# Patient Record
Sex: Female | Born: 1970
Health system: Southern US, Community
[De-identification: ages and names within clinical notes are randomized; demographics above are authoritative.]

## PROBLEM LIST (undated history)

## (undated) DIAGNOSIS — M549 Dorsalgia, unspecified: Secondary | ICD-10-CM

## (undated) DIAGNOSIS — L509 Urticaria, unspecified: Secondary | ICD-10-CM

## (undated) DIAGNOSIS — F32A Depression, unspecified: Secondary | ICD-10-CM

## (undated) DIAGNOSIS — G8929 Other chronic pain: Secondary | ICD-10-CM

## (undated) DIAGNOSIS — I10 Essential (primary) hypertension: Secondary | ICD-10-CM

## (undated) DIAGNOSIS — T783XXA Angioneurotic edema, initial encounter: Secondary | ICD-10-CM

## (undated) DIAGNOSIS — F419 Anxiety disorder, unspecified: Secondary | ICD-10-CM

## (undated) DIAGNOSIS — F99 Mental disorder, not otherwise specified: Secondary | ICD-10-CM

## (undated) DIAGNOSIS — F329 Major depressive disorder, single episode, unspecified: Secondary | ICD-10-CM

## (undated) DIAGNOSIS — J449 Chronic obstructive pulmonary disease, unspecified: Secondary | ICD-10-CM

## (undated) DIAGNOSIS — F4310 Post-traumatic stress disorder, unspecified: Secondary | ICD-10-CM

## (undated) HISTORY — DX: Urticaria, unspecified: L50.9

## (undated) HISTORY — DX: Mental disorder, not otherwise specified: F99

## (undated) HISTORY — PX: CYST REMOVAL HAND: SHX6279

## (undated) HISTORY — DX: Depression, unspecified: F32.A

## (undated) HISTORY — DX: Angioneurotic edema, initial encounter: T78.3XXA

## (undated) HISTORY — DX: Major depressive disorder, single episode, unspecified: F32.9

---

## 1998-04-05 ENCOUNTER — Ambulatory Visit (HOSPITAL_COMMUNITY): Admission: RE | Admit: 1998-04-05 | Discharge: 1998-04-05 | Payer: Self-pay | Admitting: Neurosurgery

## 1999-12-12 ENCOUNTER — Emergency Department (HOSPITAL_COMMUNITY): Admission: EM | Admit: 1999-12-12 | Discharge: 1999-12-12 | Payer: Self-pay | Admitting: Emergency Medicine

## 1999-12-12 ENCOUNTER — Encounter: Payer: Self-pay | Admitting: Emergency Medicine

## 2002-01-16 ENCOUNTER — Other Ambulatory Visit: Admission: RE | Admit: 2002-01-16 | Discharge: 2002-01-16 | Payer: Self-pay | Admitting: Family Medicine

## 2002-01-16 ENCOUNTER — Other Ambulatory Visit: Admission: RE | Admit: 2002-01-16 | Discharge: 2002-01-16 | Payer: Self-pay | Admitting: Anesthesiology

## 2003-11-07 ENCOUNTER — Ambulatory Visit (HOSPITAL_BASED_OUTPATIENT_CLINIC_OR_DEPARTMENT_OTHER): Admission: RE | Admit: 2003-11-07 | Discharge: 2003-11-07 | Payer: Self-pay | Admitting: *Deleted

## 2003-11-07 ENCOUNTER — Ambulatory Visit (HOSPITAL_COMMUNITY): Admission: RE | Admit: 2003-11-07 | Discharge: 2003-11-07 | Payer: Self-pay | Admitting: *Deleted

## 2003-11-07 ENCOUNTER — Encounter (INDEPENDENT_AMBULATORY_CARE_PROVIDER_SITE_OTHER): Payer: Self-pay | Admitting: Specialist

## 2005-10-10 ENCOUNTER — Emergency Department (HOSPITAL_COMMUNITY): Admission: EM | Admit: 2005-10-10 | Discharge: 2005-10-10 | Payer: Self-pay | Admitting: Emergency Medicine

## 2007-04-07 ENCOUNTER — Other Ambulatory Visit: Admission: RE | Admit: 2007-04-07 | Discharge: 2007-04-07 | Payer: Self-pay | Admitting: Family Medicine

## 2007-04-15 ENCOUNTER — Emergency Department (HOSPITAL_COMMUNITY): Admission: EM | Admit: 2007-04-15 | Discharge: 2007-04-15 | Payer: Self-pay | Admitting: Emergency Medicine

## 2007-06-02 ENCOUNTER — Emergency Department (HOSPITAL_COMMUNITY): Admission: EM | Admit: 2007-06-02 | Discharge: 2007-06-02 | Payer: Self-pay | Admitting: Emergency Medicine

## 2008-01-10 ENCOUNTER — Emergency Department (HOSPITAL_COMMUNITY): Admission: EM | Admit: 2008-01-10 | Discharge: 2008-01-10 | Payer: Self-pay | Admitting: Emergency Medicine

## 2008-06-22 ENCOUNTER — Encounter: Admission: RE | Admit: 2008-06-22 | Discharge: 2008-06-26 | Payer: Self-pay | Admitting: Anesthesiology

## 2008-06-26 ENCOUNTER — Ambulatory Visit: Payer: Self-pay | Admitting: Anesthesiology

## 2008-08-07 ENCOUNTER — Encounter: Admission: RE | Admit: 2008-08-07 | Discharge: 2008-08-22 | Payer: Self-pay | Admitting: Anesthesiology

## 2009-10-14 ENCOUNTER — Other Ambulatory Visit: Admission: RE | Admit: 2009-10-14 | Discharge: 2009-10-14 | Payer: Self-pay | Admitting: Family Medicine

## 2010-10-02 ENCOUNTER — Emergency Department (HOSPITAL_COMMUNITY)
Admission: EM | Admit: 2010-10-02 | Discharge: 2010-10-02 | Payer: Self-pay | Source: Home / Self Care | Admitting: Emergency Medicine

## 2010-11-03 ENCOUNTER — Ambulatory Visit: Payer: Medicaid Other | Attending: Family Medicine | Admitting: Physical Therapy

## 2010-11-03 DIAGNOSIS — M545 Low back pain, unspecified: Secondary | ICD-10-CM | POA: Insufficient documentation

## 2010-11-03 DIAGNOSIS — M25659 Stiffness of unspecified hip, not elsewhere classified: Secondary | ICD-10-CM | POA: Insufficient documentation

## 2010-11-03 DIAGNOSIS — IMO0001 Reserved for inherently not codable concepts without codable children: Secondary | ICD-10-CM | POA: Insufficient documentation

## 2010-11-10 ENCOUNTER — Encounter: Payer: Medicaid Other | Admitting: Physical Therapy

## 2010-11-17 ENCOUNTER — Ambulatory Visit: Payer: Medicaid Other | Attending: Family Medicine | Admitting: Physical Therapy

## 2010-11-17 DIAGNOSIS — IMO0001 Reserved for inherently not codable concepts without codable children: Secondary | ICD-10-CM | POA: Insufficient documentation

## 2010-11-17 DIAGNOSIS — M545 Low back pain, unspecified: Secondary | ICD-10-CM | POA: Insufficient documentation

## 2010-11-17 DIAGNOSIS — M25659 Stiffness of unspecified hip, not elsewhere classified: Secondary | ICD-10-CM | POA: Insufficient documentation

## 2010-11-26 ENCOUNTER — Ambulatory Visit: Payer: Medicaid Other | Admitting: Physical Therapy

## 2010-12-09 ENCOUNTER — Ambulatory Visit: Payer: Medicaid Other | Attending: Family Medicine | Admitting: Physical Therapy

## 2010-12-09 DIAGNOSIS — M545 Low back pain, unspecified: Secondary | ICD-10-CM | POA: Insufficient documentation

## 2010-12-09 DIAGNOSIS — M25659 Stiffness of unspecified hip, not elsewhere classified: Secondary | ICD-10-CM | POA: Insufficient documentation

## 2010-12-09 DIAGNOSIS — IMO0001 Reserved for inherently not codable concepts without codable children: Secondary | ICD-10-CM | POA: Insufficient documentation

## 2011-01-20 NOTE — Assessment & Plan Note (Signed)
Windi Toro comes to [facility] today as a kind  referral from Dr. Jake Seats Cohen's office complaining of lower back and left  leg pain.  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  She does not think she can return to work with pain like  this, and relates that she has had 3 epidural translaminar interventions  with modest recycling, and we discussed this.  She has had a number of  different approaches assessing her leg pain and back pain, inclusive of  MRI nerve conduction studies essentially all within normal limits 6/10  subjective scale made worse by walking, bending, and sitting sometimes  standing, improved with heat and ice.  She wants to do more activities,  and increase her endurance capacity.  She is stating that she is able to  do most household activities.  A 14-point review of systems.  Health and  History form reviewed.  She is seeing also psychiatrist who is helping  with situational anxiety and depression.  She states no wish to harm  self or others and doing well here.  TMH remarkable for hypertension and  cyst on the wrist, she is single, denies chemical alcohol dependency.   PHYSICAL EXAMINATION:  GENERAL:  Pleasant obese female sitting  comfortably in bed.  Gait, affect (normal oriented x3).  HEENT:  Otherwise unremarkable.  CHEST:  Clear to auscultation and percussion.  She has regular rate and  rhythm without rub, murmur, or gallop.  ABDOMEN:  Obese, benign.  No hepatosplenomegaly.  She has diffuse  paralumbar myofascial discomfort with Fortin test positive, side  bending.  Waddell positive 1/5.  Pain with extension, but I do not seen  anything new neurologically.  A mixed spondylitic modest radicular pain,  particularly 5.   IMPRESSION:  Degenerative spine disease,  lumbar spine with spondylitic  pain.   PLAN:  1. The degenerative components  are going to be probably reflected in      mild fascial overlay, with spondylotic pain, and possible nerve      root irritability without neurocompressive event.  It is reasonable      to consider either facet injection or diagnostic therapeutic      transforaminal intervention and she will consider this.  I am going      to start her on Ultram ER to avoid nonnarcotic medication      alternatives emphasizing return to work and return to active      functional lifestyle.  2. Maintain contact with her psychiatrist.  Psychiatrist has her out      of work.  3. Recommend weight control.  See primary care.  She is trying to      loose weight.  She understands this and I discuss activities at      home to help in this regard.  4. Ultram ER symptom management.  5. I do not think, we will need to go into adjuncts.  She does not      like taking a lot of medicines, and I will review those with her.      Questions were answered, discussed in lay terms, I will order a      TENS unit.  See how she is doing in a couple of weeks.           ______________________________  Celene Kras, MD     HH/MedQ  D:  06/26/2008 13:23:31  T:  06/27/2008 01:43:14  Job #:  [id number]   cc:   Sharolyn Douglas, M.D.  Fax: [phone]

## 2011-01-23 NOTE — Op Note (Signed)
Sabrina Ortiz, Sabrina Ortiz                         ACCOUNT NO.:  [id number]   MEDICAL RECORD NO.:  [id number]                   PATIENT TYPE:  AMB   LOCATION:  [facility]                                  FACILITY:  [facility]   PHYSICIAN:  Lowell Bouton, M.D.      DATE OF BIRTH:  [birth year]   DATE OF PROCEDURE:  11/07/2003  DATE OF DISCHARGE:                                 OPERATIVE REPORT   PREOPERATIVE DIAGNOSIS:  Dorsal ganglion, left wrist.   POSTOPERATIVE DIAGNOSIS:  Dorsal ganglion, left wrist.   OPERATION PERFORMED:  Excision of dorsal ganglion, left wrist.   SURGEON:  Lowell Bouton, M.D.   ANESTHESIA:  General.   OPERATIVE FINDINGS:  The patient had a multilobed ganglion that arose from a  very small stalk at the scapholunate interval.   DESCRIPTION OF PROCEDURE:  Under general anesthesia with a tourniquet on the  left arm, the left hand was prepped and draped in the usual fashion.  After  exsanguinating the limb, the tourniquet was deflated to 250 mmHg.  A  transverse incision was made across the dorsum of the wrist over the mass.  Blunt dissection was carried down through the subcutaneous tissue and  bleeding points were coagulated.  Blunt dissection was carried down to the  cyst and it was dissected out.  It appeared to arise from the scapholunate  interval.  After dissecting the mass out completely, bluntly, the mass was  excised in its entirety.  It was sent to the lab.  It was found to have a  stalk that went down to the scapholunate which was sharply dissected down to  the ligament.  The wound was then irrigated with saline.  The skin was  closed over a vessel loop drain using a 3-0 subcuticular Prolene.  Steri-  Strips were applied followed by sterile dressings.  The patient was placed  in a volar wrist splint.  The tourniquet was released with good circulation  to the hand.  The patient went to the recovery room awake and stable in good  condition.  0.5%  Marcaine was inserted in the skin incision for pain  control.                                               Lowell Bouton, M.D.    EMM/MEDQ  D:  11/07/2003  T:  11/08/2003  Job:  [id number]   cc:   Lorelle Formosa, M.D.  [phone] [address]. [address]  [place]  [place] [address]  Fax: [phone]

## 2011-08-02 ENCOUNTER — Emergency Department (HOSPITAL_COMMUNITY)
Admission: EM | Admit: 2011-08-02 | Discharge: 2011-08-02 | Disposition: A | Discharge status: Home / Self Care | Payer: No Typology Code available for payment source | Attending: Emergency Medicine | Admitting: Emergency Medicine

## 2011-08-02 DIAGNOSIS — S8002XA Contusion of left knee, initial encounter: Secondary | ICD-10-CM

## 2011-08-02 DIAGNOSIS — M545 Low back pain, unspecified: Secondary | ICD-10-CM

## 2011-08-02 DIAGNOSIS — S8000XA Contusion of unspecified knee, initial encounter: Secondary | ICD-10-CM | POA: Insufficient documentation

## 2011-08-02 DIAGNOSIS — Y92410 Unspecified street and highway as the place of occurrence of the external cause: Secondary | ICD-10-CM | POA: Insufficient documentation

## 2011-08-02 DIAGNOSIS — Z79899 Other long term (current) drug therapy: Secondary | ICD-10-CM | POA: Insufficient documentation

## 2011-08-02 MED ORDER — OXYCODONE-ACETAMINOPHEN 5-325 MG PO TABS
1.0000 | ORAL_TABLET | Freq: Once | ORAL | Status: AC
Start: 2011-08-02 — End: 2011-08-02
  Administered 2011-08-02: 1 via ORAL
  Filled 2011-08-02: qty 1

## 2011-08-02 MED ORDER — DIAZEPAM 5 MG PO TABS: ORAL_TABLET | ORAL | Status: AC

## 2011-08-02 MED ORDER — IBUPROFEN 800 MG PO TABS
800.0000 mg | ORAL_TABLET | Freq: Once | ORAL | Status: AC
  Administered 2011-08-02: 800 mg via ORAL
  Filled 2011-08-02: qty 1

## 2011-08-02 MED ORDER — IBUPROFEN 800 MG PO TABS: 800.0000 mg | ORAL_TABLET | Freq: Three times a day (TID) | ORAL | Status: AC

## 2011-08-02 MED ORDER — HYDROCODONE-ACETAMINOPHEN 5-500 MG PO TABS: 1.0000 | ORAL_TABLET | Freq: Four times a day (QID) | ORAL | Status: AC | PRN
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
## 2011-08-03 ENCOUNTER — Encounter: Payer: Self-pay | Admitting: *Deleted

## 2011-08-03 NOTE — ED Provider Notes (Signed)
Medical screening examination/treatment/procedure(s) were performed by non-physician practitioner and as supervising physician I was immediately available for consultation/collaboration.   Lyanne Co, MD 08/03/11 [phone]

## 2012-03-07 ENCOUNTER — Encounter (HOSPITAL_COMMUNITY): Payer: Self-pay | Admitting: *Deleted

## 2012-03-07 ENCOUNTER — Emergency Department (HOSPITAL_COMMUNITY)
Admission: EM | Admit: 2012-03-07 | Discharge: 2012-03-08 | Disposition: A | Discharge status: Other Acute Inpatient Hospital | Payer: Self-pay | Attending: Emergency Medicine | Admitting: Emergency Medicine

## 2012-03-07 DIAGNOSIS — R5381 Other malaise: Secondary | ICD-10-CM | POA: Insufficient documentation

## 2012-03-07 DIAGNOSIS — F329 Major depressive disorder, single episode, unspecified: Secondary | ICD-10-CM

## 2012-03-07 DIAGNOSIS — R079 Chest pain, unspecified: Secondary | ICD-10-CM | POA: Insufficient documentation

## 2012-03-07 DIAGNOSIS — R45851 Suicidal ideations: Secondary | ICD-10-CM | POA: Insufficient documentation

## 2012-03-07 DIAGNOSIS — K219 Gastro-esophageal reflux disease without esophagitis: Secondary | ICD-10-CM | POA: Insufficient documentation

## 2012-03-07 DIAGNOSIS — Z79899 Other long term (current) drug therapy: Secondary | ICD-10-CM | POA: Insufficient documentation

## 2012-03-07 DIAGNOSIS — F3289 Other specified depressive episodes: Secondary | ICD-10-CM | POA: Insufficient documentation

## 2012-03-07 LAB — COMPREHENSIVE METABOLIC PANEL
ALT: 24 U/L (ref 0–35)
AST: 26 U/L (ref 0–37)
Alkaline Phosphatase: 55 U/L (ref 39–117)
CO2: 25 mEq/L (ref 19–32)
Chloride: 100 mEq/L (ref 96–112)
Creatinine, Ser: 1.01 mg/dL (ref 0.50–1.10)
GFR calc non Af Amer: 68 mL/min — ABNORMAL LOW (ref >90)
Potassium: 3.6 mEq/L (ref 3.5–5.1)
Total Bilirubin: 0.2 mg/dL — ABNORMAL LOW (ref 0.3–1.2)

## 2012-03-07 LAB — POCT I-STAT TROPONIN I: Troponin i, poc: 0 ng/mL (ref 0.00–0.08)

## 2012-03-07 LAB — RAPID URINE DRUG SCREEN, HOSP PERFORMED
Amphetamines: NOT DETECTED
Barbiturates: NOT DETECTED
Opiates: NOT DETECTED
Tetrahydrocannabinol: NOT DETECTED

## 2012-03-07 LAB — CBC
MCV: 75.6 fL — ABNORMAL LOW (ref 78.0–100.0)
Platelets: 198 10*3/uL (ref 150–400)
RBC: 5.04 MIL/uL (ref 3.87–5.11)
WBC: 8.8 10*3/uL (ref 4.0–10.5)

## 2012-03-07 LAB — PREGNANCY, URINE: Preg Test, Ur: NEGATIVE

## 2012-03-07 MED ORDER — HYDROCHLOROTHIAZIDE 25 MG PO TABS
25.0000 mg | ORAL_TABLET | Freq: Every day | ORAL | Status: DC
  Administered 2012-03-08: 25 mg via ORAL
  Filled 2012-03-07: qty 1

## 2012-03-07 MED ORDER — GABAPENTIN 300 MG PO CAPS
300.0000 mg | ORAL_CAPSULE | Freq: Every day | ORAL | Status: DC
  Administered 2012-03-07: 300 mg via ORAL
  Filled 2012-03-07 (×2): qty 1

## 2012-03-07 MED ORDER — LISINOPRIL 20 MG PO TABS
20.0000 mg | ORAL_TABLET | Freq: Every day | ORAL | Status: DC
  Administered 2012-03-08: 20 mg via ORAL
  Filled 2012-03-07: qty 1

## 2012-03-07 MED ORDER — SERTRALINE HCL 50 MG PO TABS
100.0000 mg | ORAL_TABLET | Freq: Every day | ORAL | Status: DC
  Administered 2012-03-07: 100 mg via ORAL
  Administered 2012-03-08: 50 mg via ORAL
  Filled 2012-03-07 (×2): qty 2

## 2012-03-07 MED ORDER — ARIPIPRAZOLE 10 MG PO TABS
10.0000 mg | ORAL_TABLET | Freq: Every day | ORAL | Status: DC
  Administered 2012-03-07 – 2012-03-08 (×2): 10 mg via ORAL
  Filled 2012-03-07 (×2): qty 1

## 2012-03-07 MED ORDER — LORAZEPAM 1 MG PO TABS: 1.0000 mg | ORAL_TABLET | Freq: Three times a day (TID) | ORAL | Status: DC | PRN

## 2012-03-07 MED ORDER — PANTOPRAZOLE SODIUM 40 MG PO TBEC
40.0000 mg | DELAYED_RELEASE_TABLET | Freq: Every day | ORAL | Status: DC
  Administered 2012-03-07 – 2012-03-08 (×2): 40 mg via ORAL
  Filled 2012-03-07 (×2): qty 1

## 2012-03-07 MED ORDER — LISINOPRIL-HYDROCHLOROTHIAZIDE 20-25 MG PO TABS: 1.0000 | ORAL_TABLET | Freq: Every day | ORAL | Status: DC

## 2012-03-07 MED ORDER — ALUM & MAG HYDROXIDE-SIMETH 200-200-20 MG/5ML PO SUSP: 30.0000 mL | ORAL | Status: DC | PRN

## 2012-03-07 MED ORDER — SIMVASTATIN 20 MG PO TABS
20.0000 mg | ORAL_TABLET | Freq: Every day | ORAL | Status: DC
  Administered 2012-03-08: 20 mg via ORAL
  Filled 2012-03-07: qty 1

## 2012-03-07 NOTE — ED Notes (Signed)
PT SCRUBBED AND WANDED

## 2012-03-07 NOTE — ED Notes (Signed)
Pt ivc for c/o suicidal; pt states has been suicidal x 2 wks; states feels mad and angry; states has a plan to overdose on meds; calm and cooperative

## 2012-03-07 NOTE — ED Provider Notes (Signed)
History     CSN: [id number]  Arrival date & time 03/07/12  1919   First MD Initiated Contact with Patient 03/07/12 2035      Chief Complaint  Patient presents with  . Medical Clearance  . Suicidal    (Consider location/radiation/quality/duration/timing/severity/associated sxs/prior treatment) HPI Comments: Patient here under IVC for suicidal thoughts - she states that she stopped her medication for depression about 1 month ago when she could no longer afford it - states that starting about 2 weeks ago she started thinking about commiting suicide, reports previous attempt  In the past with taking pills, which would be her method of choice today.  She states she has been mad and angry at the world, only physical complaints are substernal chest pain - states that has been going on since she stopped taking her nexium and she believes this to be her GERD.  Reports that the pain is non-radiation, not associated with shortness of breath, cough, diaphoresis, nausea or vomiting.  Patient is a 41 y.o. female presenting with mental health disorder. The history is provided by the patient. No language interpreter was used.  Mental Health Problem The primary symptoms include dysphoric mood. The primary symptoms do not include delusions, hallucinations, bizarre behavior, disorganized speech, negative symptoms or somatic symptoms. The current episode started 1 to 2 weeks ago. This is a recurrent problem.  The onset of the illness is precipitated by emotional stress. The degree of incapacity that she is experiencing as a consequence of her illness is severe. Sequelae of the illness include harmed interpersonal relations, an inability to care for self and an inability to work. Additional symptoms of the illness include anhedonia, fatigue and feelings of worthlessness. Additional symptoms of the illness do not include no insomnia, no hypersomnia, no appetite change, no unexpected weight change, no agitation, no  psychomotor retardation, no attention impairment, no euphoric mood, no increased goal-directed activity, no flight of ideas, no inflated self-esteem, no decreased need for sleep, patient does not exhibit inattention/distractibility, no poor judgment, no visual change, no headaches, no abdominal pain or no seizures. She admits to suicidal ideas. She does have a plan to commit suicide. She contemplates harming herself. She has not already injured self. She does not contemplate injuring another person. She has not already  injured another person. Risk factors that are present for mental illness include a history of mental illness and a history of suicide attempts.    History reviewed. No pertinent past medical history.  History reviewed. No pertinent past surgical history.  No family history on file.  History  Substance Use Topics  . Smoking status: Not on file  . Smokeless tobacco: Not on file  . Alcohol Use: No    OB History    Grav Para Term Preterm Abortions TAB SAB Ect Mult Living                  Review of Systems  Constitutional: Positive for fatigue. Negative for appetite change and unexpected weight change.  HENT: Negative for neck pain.   Eyes: Negative for pain.  Cardiovascular: Positive for chest pain.  Gastrointestinal: Negative for abdominal pain.  Genitourinary: Negative for dysuria.  Musculoskeletal: Negative for back pain.  Neurological: Negative for seizures and headaches.  Psychiatric/Behavioral: Positive for suicidal ideas and dysphoric mood. Negative for hallucinations and agitation. The patient does not have insomnia.   All other systems reviewed and are negative.    Allergies  Review of patient's allergies indicates no known  allergies.  Home Medications   Current Outpatient Rx  Name Route Sig Dispense Refill  . ARIPIPRAZOLE 10 MG PO TABS Oral Take 10 mg by mouth daily.      Marland Kitchen GABAPENTIN 100 MG PO CAPS Oral Take 300 mg by mouth at bedtime.    Marland Kitchen  LISINOPRIL-HYDROCHLOROTHIAZIDE 20-25 MG PO TABS Oral Take 1 tablet by mouth daily.    Marland Kitchen PANTOPRAZOLE SODIUM 40 MG PO TBEC Oral Take 40 mg by mouth daily.      Marland Kitchen PRAVASTATIN SODIUM 40 MG PO TABS Oral Take 40 mg by mouth daily.    . SERTRALINE HCL 100 MG PO TABS Oral Take 100 mg by mouth daily.        BP 121/77  Pulse 84  Temp 97.6 F (36.4 C) (Oral)  Resp 20  SpO2 100%  LMP 03/01/2012  Physical Exam  Nursing note and vitals reviewed. Constitutional: She is oriented to person, place, and time. She appears well-developed and well-nourished. No distress.  HENT:  Head: Normocephalic and atraumatic.  Right Ear: External ear normal.  Left Ear: External ear normal.  Nose: Nose normal.  Mouth/Throat: Oropharynx is clear and moist. No oropharyngeal exudate.  Eyes: Conjunctivae are normal. Pupils are equal, round, and reactive to light. No scleral icterus.  Neck: Normal range of motion. Neck supple.  Cardiovascular: Normal rate, regular rhythm and normal heart sounds.  Exam reveals no gallop and no friction rub.   No murmur heard. Pulmonary/Chest: Effort normal and breath sounds normal. No respiratory distress. She has no wheezes. She has no rales. She exhibits no tenderness.  Abdominal: Soft. Bowel sounds are normal. She exhibits no distension. There is no tenderness.  Musculoskeletal: Normal range of motion. She exhibits no edema and no tenderness.  Lymphadenopathy:    She has no cervical adenopathy.  Neurological: She is alert and oriented to person, place, and time. No cranial nerve deficit. She exhibits normal muscle tone. Coordination normal.  Skin: Skin is warm and dry. No rash noted. No erythema. No pallor.  Psychiatric: She has a normal mood and affect. Her behavior is normal. Judgment and thought content normal.    ED Course  Procedures (including critical care time)  Labs Reviewed  CBC - Abnormal; Notable for the following:    MCV 75.6 (*)     MCH 25.2 (*)     RDW 15.9 (*)      All other components within normal limits  COMPREHENSIVE METABOLIC PANEL - Abnormal; Notable for the following:    Glucose, Bld 107 (*)     Total Bilirubin 0.2 (*)     GFR calc non Af Amer 68 (*)     GFR calc Af Amer 79 (*)     All other components within normal limits  ETHANOL  URINE RAPID DRUG SCREEN (HOSP PERFORMED)  PREGNANCY, URINE   No results found.  Results for orders placed during the hospital encounter of 03/07/12  CBC      Component Value Range   WBC 8.8  4.0 - 10.5 K/uL   RBC 5.04  3.87 - 5.11 MIL/uL   Hemoglobin 12.7  12.0 - 15.0 g/dL   HCT 16.1  09.6 - 04.5 %   MCV 75.6 (*) 78.0 - 100.0 fL   MCH 25.2 (*) 26.0 - 34.0 pg   MCHC 33.3  30.0 - 36.0 g/dL   RDW 40.9 (*) 81.1 - 91.4 %   Platelets 198  150 - 400 K/uL  COMPREHENSIVE METABOLIC PANEL  Component Value Range   Sodium 137  135 - 145 mEq/L   Potassium 3.6  3.5 - 5.1 mEq/L   Chloride 100  96 - 112 mEq/L   CO2 25  19 - 32 mEq/L   Glucose, Bld 107 (*) 70 - 99 mg/dL   BUN 9  6 - 23 mg/dL   Creatinine, Ser 1.61  0.50 - 1.10 mg/dL   Calcium 9.5  8.4 - 09.6 mg/dL   Total Protein 7.4  6.0 - 8.3 g/dL   Albumin 4.0  3.5 - 5.2 g/dL   AST 26  0 - 37 U/L   ALT 24  0 - 35 U/L   Alkaline Phosphatase 55  39 - 117 U/L   Total Bilirubin 0.2 (*) 0.3 - 1.2 mg/dL   GFR calc non Af Amer 68 (*) >90 mL/min   GFR calc Af Amer 79 (*) >90 mL/min  ETHANOL      Component Value Range   Alcohol, Ethyl (B) <11  0 - 11 mg/dL  URINE RAPID DRUG SCREEN (HOSP PERFORMED)      Component Value Range   Opiates NONE DETECTED  NONE DETECTED   Cocaine NONE DETECTED  NONE DETECTED   Benzodiazepines NONE DETECTED  NONE DETECTED   Amphetamines NONE DETECTED  NONE DETECTED   Tetrahydrocannabinol NONE DETECTED  NONE DETECTED   Barbiturates NONE DETECTED  NONE DETECTED  PREGNANCY, URINE      Component Value Range   Preg Test, Ur NEGATIVE  NEGATIVE  POCT I-STAT TROPONIN I      Component Value Range   Troponin i, poc 0.00  0.00 - 0.08  ng/mL   Comment 3           CARDIAC PANEL(CRET KIN+CKTOT+MB+TROPI)      Component Value Range   Total CK 178 (*) 7 - 177 U/L   CK, MB 1.3  0.3 - 4.0 ng/mL   Troponin I <0.30  <0.30 ng/mL   Relative Index 0.7  0.0 - 2.5   No results found.  Date: 03/08/2012  Rate: 69  Rhythm: normal sinus rhythm  QRS Axis: normal  Intervals: normal  ST/T Wave abnormalities: nonspecific T wave changes  Conduction Disutrbances:none  Narrative Interpretation: Reviewed by Dr. Lynelle Doctor  Old EKG Reviewed: none available    Suicidal Ideation Depression GERD   MDM  4:07 AM Second troponin is negative so I believe the patient's chest pain to be GERD related.  She is therefore medically cleared and I have spoken with Barth Kirks with ACT team who will see the patient.  I have started her back on her psychiatric and GERD medications.        Izola Price [place], [place] 03/08/12 0410

## 2012-03-08 ENCOUNTER — Telehealth (HOSPITAL_COMMUNITY): Payer: Self-pay | Admitting: Licensed Clinical Social Worker

## 2012-03-08 ENCOUNTER — Inpatient Hospital Stay (HOSPITAL_COMMUNITY)
Admission: AD | Admit: 2012-03-08 | Discharge: 2012-03-11 | DRG: 885 | Disposition: A | Discharge status: Home / Self Care | Payer: Federal, State, Local not specified - Other | Source: Ambulatory Visit | Attending: Psychiatry | Admitting: Psychiatry

## 2012-03-08 ENCOUNTER — Encounter (HOSPITAL_COMMUNITY): Payer: Self-pay

## 2012-03-08 ENCOUNTER — Encounter (HOSPITAL_COMMUNITY): Payer: Self-pay | Admitting: Licensed Clinical Social Worker

## 2012-03-08 DIAGNOSIS — F411 Generalized anxiety disorder: Secondary | ICD-10-CM | POA: Diagnosis present

## 2012-03-08 DIAGNOSIS — I10 Essential (primary) hypertension: Secondary | ICD-10-CM | POA: Diagnosis present

## 2012-03-08 DIAGNOSIS — F489 Nonpsychotic mental disorder, unspecified: Secondary | ICD-10-CM | POA: Diagnosis present

## 2012-03-08 DIAGNOSIS — G4701 Insomnia due to medical condition: Secondary | ICD-10-CM

## 2012-03-08 DIAGNOSIS — F333 Major depressive disorder, recurrent, severe with psychotic symptoms: Principal | ICD-10-CM | POA: Diagnosis present

## 2012-03-08 DIAGNOSIS — F4310 Post-traumatic stress disorder, unspecified: Secondary | ICD-10-CM | POA: Diagnosis present

## 2012-03-08 DIAGNOSIS — Z79899 Other long term (current) drug therapy: Secondary | ICD-10-CM

## 2012-03-08 DIAGNOSIS — F5105 Insomnia due to other mental disorder: Secondary | ICD-10-CM | POA: Diagnosis present

## 2012-03-08 HISTORY — DX: Post-traumatic stress disorder, unspecified: F43.10

## 2012-03-08 HISTORY — DX: Other chronic pain: G89.29

## 2012-03-08 HISTORY — DX: Dorsalgia, unspecified: M54.9

## 2012-03-08 HISTORY — DX: Anxiety disorder, unspecified: F41.9

## 2012-03-08 HISTORY — DX: Essential (primary) hypertension: I10

## 2012-03-08 LAB — CARDIAC PANEL(CRET KIN+CKTOT+MB+TROPI)
Total CK: 178 U/L — ABNORMAL HIGH (ref 7–177)
Troponin I: <0.3 ng/mL (ref <0.30)

## 2012-03-08 MED ORDER — ACETAMINOPHEN 325 MG PO TABS: 650.0000 mg | ORAL_TABLET | Freq: Four times a day (QID) | ORAL | Status: DC | PRN

## 2012-03-08 MED ORDER — MAGNESIUM HYDROXIDE 400 MG/5ML PO SUSP: 30.0000 mL | Freq: Every day | ORAL | Status: DC | PRN

## 2012-03-08 MED ORDER — ARIPIPRAZOLE 10 MG PO TABS
10.0000 mg | ORAL_TABLET | Freq: Every day | ORAL | Status: DC
  Administered 2012-03-09 – 2012-03-10 (×2): 10 mg via ORAL
  Filled 2012-03-08 (×5): qty 1

## 2012-03-08 MED ORDER — HYDROCHLOROTHIAZIDE 25 MG PO TABS
25.0000 mg | ORAL_TABLET | Freq: Every day | ORAL | Status: DC
  Administered 2012-03-09 – 2012-03-11 (×3): 25 mg via ORAL
  Filled 2012-03-08 (×6): qty 1

## 2012-03-08 MED ORDER — SIMVASTATIN 20 MG PO TABS: 20.0000 mg | ORAL_TABLET | Freq: Every day | ORAL | Status: DC

## 2012-03-08 MED ORDER — HYDROXYZINE HCL 25 MG PO TABS
25.0000 mg | ORAL_TABLET | Freq: Four times a day (QID) | ORAL | Status: DC | PRN
  Administered 2012-03-09: 25 mg via ORAL

## 2012-03-08 MED ORDER — SIMVASTATIN 20 MG PO TABS
20.0000 mg | ORAL_TABLET | Freq: Every day | ORAL | Status: DC
  Administered 2012-03-08 – 2012-03-10 (×3): 20 mg via ORAL
  Filled 2012-03-08 (×6): qty 1

## 2012-03-08 MED ORDER — SERTRALINE HCL 100 MG PO TABS: 100.0000 mg | ORAL_TABLET | Freq: Every day | ORAL | Status: DC

## 2012-03-08 MED ORDER — LISINOPRIL-HYDROCHLOROTHIAZIDE 20-25 MG PO TABS: 1.0000 | ORAL_TABLET | Freq: Every day | ORAL | Status: DC

## 2012-03-08 MED ORDER — SERTRALINE HCL 50 MG PO TABS
150.0000 mg | ORAL_TABLET | Freq: Every day | ORAL | Status: DC
  Filled 2012-03-08 (×4): qty 1

## 2012-03-08 MED ORDER — LISINOPRIL 20 MG PO TABS
20.0000 mg | ORAL_TABLET | Freq: Every day | ORAL | Status: DC
  Administered 2012-03-09 – 2012-03-11 (×3): 20 mg via ORAL
  Filled 2012-03-08 (×6): qty 1

## 2012-03-08 MED ORDER — GABAPENTIN 300 MG PO CAPS
300.0000 mg | ORAL_CAPSULE | Freq: Every day | ORAL | Status: DC
  Administered 2012-03-08 – 2012-03-10 (×3): 300 mg via ORAL
  Filled 2012-03-08 (×2): qty 1
  Filled 2012-03-08: qty 14
  Filled 2012-03-08 (×3): qty 1

## 2012-03-08 MED ORDER — PANTOPRAZOLE SODIUM 40 MG PO TBEC
40.0000 mg | DELAYED_RELEASE_TABLET | Freq: Every day | ORAL | Status: DC
  Administered 2012-03-09 – 2012-03-11 (×3): 40 mg via ORAL
  Filled 2012-03-08 (×6): qty 1

## 2012-03-08 MED ORDER — ALUM & MAG HYDROXIDE-SIMETH 200-200-20 MG/5ML PO SUSP: 30.0000 mL | ORAL | Status: DC | PRN
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
## 2012-03-08 NOTE — ED Provider Notes (Signed)
Medical screening examination/treatment/procedure(s) were performed by non-physician practitioner and as supervising physician I was immediately available for consultation/collaboration.   Celene Kras, MD 03/08/12 [phone]

## 2012-03-08 NOTE — ED Provider Notes (Addendum)
BP 115/65  Pulse 69  Temp 98.1 F (36.7 C) (Oral)  Resp 18  SpO2 98%  LMP 03/01/2012  Patient seen and evaluated by me. No complaints at this time. Awaiting ACT evaluation.   Forbes Cellar, MD 03/08/12 0800   BP 111/75  Pulse 66  Temp 97.8 F (36.6 C) (Oral)  Resp 14  SpO2 100%  LMP 03/01/2012  Accepted to [facility]- Dr. Cynda Acres, MD 03/08/12 [phone]

## 2012-03-08 NOTE — Tx Team (Signed)
Initial Interdisciplinary Treatment Plan  PATIENT STRENGTHS: (choose at least two) Ability for insight Average or above average intelligence General fund of knowledge  PATIENT STRESSORS: Loss of Relationship*   PROBLEM LIST: Problem List/Patient Goals Date to be addressed Date deferred Reason deferred Estimated date of resolution  Depression      Suicidal Ideation                                                 DISCHARGE CRITERIA:  Improved stabilization in mood, thinking, and/or behavior  PRELIMINARY DISCHARGE PLAN: Outpatient therapy  PATIENT/FAMIILY INVOLVEMENT: This treatment plan has been presented to and reviewed with the patient, Sabrina Ortiz, and/or family member.  The patient and family have been given the opportunity to ask questions and make suggestions.  Gretta Arab [facility] 03/08/2012, 8:40 PM

## 2012-03-08 NOTE — ED Notes (Signed)
Discharge instructions reviewed and pt verbalizes understanding about transport to [facility]. [employer] departs with pt in safe and stable condition.

## 2012-03-09 DIAGNOSIS — F333 Major depressive disorder, recurrent, severe with psychotic symptoms: Principal | ICD-10-CM

## 2012-03-09 MED ORDER — SERTRALINE HCL 50 MG PO TABS
50.0000 mg | ORAL_TABLET | Freq: Every day | ORAL | Status: DC
  Administered 2012-03-10 – 2012-03-11 (×2): 50 mg via ORAL
  Filled 2012-03-09: qty 14
  Filled 2012-03-09 (×4): qty 1
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
## 2012-03-09 NOTE — Progress Notes (Signed)
Psychoeducational Group Note  Date:  03/09/2012 Time:  1100  Group Topic/Focus:  Coping With Mental Health Crisis:   The purpose of this group is to help patients identify strategies for coping with mental health crisis.  Group discusses possible causes of crisis and ways to manage them effectively.  Participation Level:  None  Participation Quality:  Appropriate  Affect:  Flat  Cognitive:  Alert  Insight:  Good  Engagement in Group:  Limited  Additional Comments:  Pt was very quiet and did not share any information while attending group.  Sharyn Lull 03/09/2012, 12:28 PM

## 2012-03-09 NOTE — Progress Notes (Addendum)
D: Patient depressed and blunted.  Patient very short in terms of conversation with Clinical research associate.  Patient denies SI/HI and denies AVH.  Patient states that "Everything is going on." Patient would not elaborate on previous statement.   A:  Patient offered support and encouragement.  Patient encouraged to speak with staff if SI occur  Staff to monitor Q 15 mins for safety.  Scheduled medication given per MD orders. R:  Patient took scheduled medication with no problem.  Patient pleasant and cooperative.  Patient remains safe on the unit. Patient in bed resting with eyes closed.

## 2012-03-09 NOTE — Progress Notes (Signed)
Recreation Therapy Group Note  Date: 03/09/2012          Time: 1500      Group Topic/Focus: The focus of this group is on enhancing the patient's understanding of leisure, barriers to leisure, and the importance of engaging in positive leisure activities upon discharge for improved total health.   Participation Level: Active  Participation Quality: Appropriate  Affect: Blunted  Cognitive: Oriented  Additional Comments: None.    Sabrina Ortiz 03/09/2012 3:45 PM
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
## 2012-03-09 NOTE — Progress Notes (Signed)
Patient seen during during d/c planning group and or treatment team.  She reports attempting suicide after learning that she has been denied disability.  She reports she has been working on the disability claim since 2010.  Patient rates depression, anxiety, and hopelessness at ten.  She is followed outpatient by [facility].  She reports she lives with her 41 year old son and has a best friend who is very supportive.  Patient is seen followed outpatient by [name].  She has home and transportation at discharge but will need assistance with transportation.

## 2012-03-09 NOTE — Progress Notes (Signed)
[facility] Group Notes: (Counselor/Nursing/MHT/Case Management/Adjunct) 03/09/2012   1:15-2:30pm Emotion Regulation  Type of Therapy:  Group Therapy  Participation Level:  Minimal  Participation Quality:  Attentive  Affect:  Blunted  Cognitive:  Appropriate  Insight:  None  Engagement in Group: Limited  Engagement in Therapy:  Limited  Modes of Intervention:  Support and Exploration  Summary of Progress/Problems: Sabrina Ortiz was attentive and engaged. She seemed to relate well to the statements of others, nodding her head along with their statements. Seemed quite interested in distress tolerance techniques and the theory of urge surfing, though she did not share any personal experiences.   Angus Palms, LCSW 03/09/2012  4:01 PM

## 2012-03-09 NOTE — Progress Notes (Signed)
Psychoeducational Group Note  Date:  03/09/2012 Time:  2000 Group Topic/Focus:  Wrap-Up Group:   The focus of this group is to help patients review their daily goal of treatment and discuss progress on daily workbooks.  Participation Level:  Minimal  Participation Quality:  Appropriate and Attentive  Affect:  Appropriate  Cognitive:  Appropriate  Insight:  Limited  Engagement in Group:  Limited  Additional Comments:  Pt was appropriate during group. Pt would nod head up and down when she was agreeable with information. Pt would only speak when questions were asked to her.  Karleen Hampshire Brittini 03/09/2012, 11:22 PM
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
## 2012-03-09 NOTE — Progress Notes (Signed)
[facility] Group Notes:  (Counselor/Nursing/MHT/Case Management/Adjunct)  03/09/2012 12:57 PM  Type of Therapy:  Psychoeducational Skills  Participation Level:  Minimal  Participation Quality:  Appropriate and Attentive  Affect:  Blunted  Cognitive:  Appropriate and Oriented  Insight:  Good  Engagement in Group:  Good  Engagement in Therapy:  n/a  Modes of Intervention:  Activity, Education, Problem-solving, Socialization and Support  Summary of Progress/Problems:Sabrina Ortiz was late due to a consult to the Psychoeducational group on labels. 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 Sabrina Ortiz was quiet but attentive while group discussed what labels are, how we use them, and listed positive and negative labels they have used or been called. Sabrina Ortiz was given a homework assignment to list 10 words she has been labeled to find the reality of the situation/label.     Wandra Scot 03/09/2012, 12:57 PM

## 2012-03-10 DIAGNOSIS — G4701 Insomnia due to medical condition: Secondary | ICD-10-CM

## 2012-03-10 DIAGNOSIS — F411 Generalized anxiety disorder: Secondary | ICD-10-CM | POA: Diagnosis present

## 2012-03-10 LAB — TSH: TSH: 0.891 u[IU]/mL (ref 0.350–4.500)

## 2012-03-10 MED ORDER — BUPROPION HCL ER (SR) 100 MG PO TB12
100.0000 mg | ORAL_TABLET | Freq: Two times a day (BID) | ORAL | Status: DC
  Administered 2012-03-10 – 2012-03-11 (×2): 100 mg via ORAL
  Filled 2012-03-10: qty 28
  Filled 2012-03-10 (×4): qty 1
  Filled 2012-03-10: qty 28
  Filled 2012-03-10: qty 1

## 2012-03-10 MED ORDER — TRAZODONE HCL 50 MG PO TABS
50.0000 mg | ORAL_TABLET | Freq: Every day | ORAL | Status: DC
  Administered 2012-03-10: 50 mg via ORAL
  Filled 2012-03-10: qty 1
  Filled 2012-03-10: qty 14
  Filled 2012-03-10 (×2): qty 1
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
## 2012-03-10 NOTE — Progress Notes (Signed)
Psychoeducational Group Note  Date:  03/10/2012 Time:  1100  Group Topic/Focus:  Building Self Esteem:   The Focus of this group is helping patients become aware of the effects of self-esteem on their lives, the things they and others do that enhance or undermine their self-esteem, seeing the relationship between their level of self-esteem and the choices they make and learning ways to enhance self-esteem.  Participation Level:  Minimal  Participation Quality:  Appropriate  Affect:  Flat  Cognitive:  Appropriate  Insight:  Good  Engagement in Group:  Limited  Additional Comments:  Pt was very limited in her interactions while in group.   Sharyn Lull 03/10/2012, 12:12 PM

## 2012-03-10 NOTE — Progress Notes (Signed)
D:Patient rates depression 7/10 tonight and hopelessness 6/10 tonight.  Patient states that she is enjoying the groups.  Patient stated, "I am realizing that I am not the only one who goes through things."  Patient still remains flat,blunted, and depressed.  Patient unsure of medication schedule.  Patient denies SI/HI and denies AVH. A: Patient medication schedule reviewed with patient.  Staff to monitor Q 15 mins for safety. Patient offered encouragement and support. R: Patient remains safe on the unit. Patient compliant with medications. Patient attended wrap up group.

## 2012-03-10 NOTE — Progress Notes (Signed)
Pt attended discharge planning group and actively participated.  Pt presents with flat affect and depressed mood.  Pt rates depression at a 5 and anxiety at a 4 today.  Pt denies SI.  Pt appears hopeless.  Pt will follow up at [facility] for medication management and therapy.  No further needs voiced by pt at this time.    Reyes Ivan, LCSWA 03/10/2012  9:27 AM

## 2012-03-10 NOTE — Progress Notes (Signed)
D: Pt denies SI/HI/AV. Pt is pleasant and cooperative. Pt rates depression at a 5 and Helplessness/hopelessness at a 4. Pt is concerned about medications and at what times she takes her medicine.  A: Pt was offered support and encouragement. Pt was given scheduled medications. Pt was encourage to attend groups. RN in spoke with pt about her medications and discussed with her the different scheduled medication times she has. Q 15 minute checks were done for safety.  R:Pt attends groups and interacts well with peers and staff. Pt taking medication. Pt has no complaints.Pt receptive to treatment and safety maintained on unit.

## 2012-03-10 NOTE — Progress Notes (Addendum)
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  Sleep: Trazodone helps will restart Appetite: The patient reports that her appetite is "up and down."  Mild>(1-10) >Severe  Hopelessness (1-10): 10  Depression (1-10): 10  Anxiety (1-10): 10  Suicidal Ideation: The patient reports suicidal ideations today but with no plan or intent.  Plan: No  Intent: No.  Means: No  Homicidal Ideation: The patient denies any homicidal ideations today.  Plan: No  Intent: No.  Means: No  General Appearance/Behavior: The patient was cooperative today with this provider but appeared still somewhat depressed.  She feels better now that she is restarted on her medications. Eye Contact: Fair.  Speech: Appropriate in rate and volume with no pressuring noted today.  Motor Behavior: wnl  Level of Consciousness: Alert and Oriented x 3.  Mental Status: Alert and Oriented x 3.  Mood: Appears slightly depressed today.  Affect: Flat.  Anxiety Level: Some anxiety reported today.  Thought Process: wnl  Thought Content: The patient denies any current auditory or visual hallucinations today as well as any delusional thinking. She does state at times her deceased Mother visits and speaks with her.  Perception: wnl  Judgment: Good.  Insight: Good.  Cognition: Oriented to person, place and time.  Review of Systems:  Neurological: The patient denies any headaches today. She denies any seizures or dizziness.  G.I.: The patient denies any constipation or any G.I. Upset today.  Musculoskeletal: The patient denies any musculoskeletal issues today.  Discussed restarting the effective Wellbutrin and Trazodone for her.  Current Medications:  Current facility-administered medications:acetaminophen (TYLENOL) tablet 650 mg, 650 mg, Oral, Q6H PRN, Curlene Labrum  Readling, MD;  alum & mag hydroxide-simeth (MAALOX/MYLANTA) 200-200-20 MG/5ML suspension 30 mL, 30 mL, Oral, Q4H PRN, Curlene Labrum Readling, MD;  buPROPion (WELLBUTRIN SR) 12 hr tablet 100 mg, 100 mg, Oral, BID, Mike Craze, MD;  gabapentin (NEURONTIN) capsule 300 mg, 300 mg, Oral, QHS, Randy D Readling, MD, 300 mg at 03/09/12 2108 hydrochlorothiazide (HYDRODIURIL) tablet 25 mg, 25 mg, Oral, Daily, Curlene Labrum Readling, MD, 25 mg at 03/10/12 0816;  hydrOXYzine (ATARAX/VISTARIL) tablet 25 mg, 25 mg, Oral, Q6H PRN, Curlene Labrum Readling, MD, 25 mg at 03/09/12 2108;  lisinopril (PRINIVIL,ZESTRIL) tablet 20 mg, 20 mg, Oral, Daily, Curlene Labrum Readling, MD, 20 mg at 03/10/12 0816;  magnesium hydroxide (MILK OF MAGNESIA) suspension 30 mL, 30 mL, Oral, Daily PRN, Curlene Labrum Readling, MD pantoprazole (PROTONIX) EC tablet 40 mg, 40 mg, Oral, Daily, Curlene Labrum Readling, MD, 40 mg at 03/10/12 0816;  sertraline (ZOLOFT) tablet 50 mg, 50 mg, Oral, Daily, Curlene Labrum Readling, MD, 50 mg at 03/10/12 0816;  simvastatin (ZOCOR) tablet 20 mg, 20 mg, Oral, QHS, Randy D Readling, MD, 20 mg at 03/09/12 2108;  traZODone (DESYREL) tablet 50 mg, 50 mg, Oral, QHS, Mike Craze, MD DISCONTD: ARIPiprazole (ABILIFY) tablet 10 mg, 10 mg, Oral, Daily, Curlene Labrum Readling, MD, 10 mg at 03/10/12 0816;  DISCONTD: sertraline (ZOLOFT) tablet 150 mg, 150 mg, Oral, Daily, Ronny Bacon, MD  Treatment Plan Summary:  1. Daily contact with patient to assess and evaluate symptoms and progress in treatment.  2. Medication management  3. The patient will deny suicidal ideations or homicidal ideations for 48 hours prior to discharge and have a depression and anxiety rating of 3 or less. The patient  will also deny any auditory or visual hallucinations or delusional thinking.  4. The patient will deny any symptoms of substance withdrawal at time of discharge.  Plan:  1. Will restart the medication Zoloft at 50 mgs po q am for depression.  2. Will stop the unafforadable  Abilify at 10 mgs and instead will go back to the Wellbutrin that had worked for her.  3. Will continue the patient on her current non-psychiatric medications as listed above.  4. Will get Vitamin D level. 5. TSH, Free T3 and Free T4 were normal.  6. Will continue to monitor.  7. Will consider D/C tomorrow. SUICIDE RISK:  MILD.

## 2012-03-11 LAB — T3, FREE: T3, Free: 2.5 pg/mL (ref 2.3–4.2)

## 2012-03-11 MED ORDER — PANTOPRAZOLE SODIUM 40 MG PO TBEC: 40.0000 mg | DELAYED_RELEASE_TABLET | Freq: Every day | ORAL | Status: DC

## 2012-03-11 MED ORDER — TRAZODONE HCL 50 MG PO TABS: 50.0000 mg | ORAL_TABLET | Freq: Every day | ORAL | Status: DC

## 2012-03-11 MED ORDER — ARIPIPRAZOLE 10 MG PO TABS: 10.0000 mg | ORAL_TABLET | Freq: Every day | ORAL | Status: DC

## 2012-03-11 MED ORDER — BUPROPION HCL ER (SR) 100 MG PO TB12: 100.0000 mg | ORAL_TABLET | Freq: Two times a day (BID) | ORAL | Status: DC

## 2012-03-11 MED ORDER — LISINOPRIL-HYDROCHLOROTHIAZIDE 20-25 MG PO TABS: 1.0000 | ORAL_TABLET | Freq: Every day | ORAL | Status: DC

## 2012-03-11 MED ORDER — GABAPENTIN 300 MG PO CAPS: 300.0000 mg | ORAL_CAPSULE | Freq: Every day | ORAL | Status: DC

## 2012-03-11 MED ORDER — SERTRALINE HCL 100 MG PO TABS: 50.0000 mg | ORAL_TABLET | Freq: Every day | ORAL | Status: DC

## 2012-03-11 MED ORDER — PRAVASTATIN SODIUM 40 MG PO TABS: 40.0000 mg | ORAL_TABLET | Freq: Every day | ORAL | Status: DC

## 2012-03-11 NOTE — Treatment Plan (Signed)
Interdisciplinary Treatment Plan Update (Adult) Date: 03/11/2012  Time Reviewed: 9:53 AM  Progress in Treatment: Attending groups: Yes Participating in groups: Yes Taking medication as prescribed: Yes, zoloft and terazodone Tolerating medication: Yes Family/Significant other contact made: No Patient understands diagnosis: Yes Discussing patient identified problems/goals with staff: Yes Medical problems stabilized or resolved: Yes Denies suicidal/homicidal ideation: Yes Issues/concerns per patient self-inventory: None identified Other: N/A New problem(s) identified: None Identified Reason for Continuation of Hospitalization: Pt. To be discharged today.  Interventions implemented related to continuation of hospitalization: mood stabilization, medication monitoring and adjustment, group therapy and psycho education, safety checks q 15 mins Additional comments: N/A Estimated length of stay: Discharge Plan: New goal(s): N/A Review of initial/current patient goals per problem list:  1. Goal(s): Reduce depressive symptoms  Met: Yes  Target date: by discharge  As evidenced by: Reducing depression from a 10 to a 3 as reported by pt. (pt reported low 2 or 3) 2. Goal (s): Reduce/Eliminate suicidal ideation  Met: Yes  Target date: by discharge  As evidenced by: pt reporting no SI.  3. Goal(s): Reduce anxiety symptoms  Met: Yes  Target date: by discharge  As evidenced by: Reduce anxiety from a 10 to a 3 as reported by pt. (pt. Reported low 2 or 3) 4. Goal(s): Follow-up appointments  Met: Yes  Target date: by discharge  As evidenced by: Pt. to follow-up with [facility] therapist and doctor.  Attendees: Patient: Sabrina Ortiz 03/11/2012   Family:    Physician: Orson Aloe, MD 03/11/2012 9:53 AM   Nursing: Kae Heller, RN   Case Manager: Clarice Pole, LCASA 03/11/2012 9:53 AM  Counselor: Angus Palms, LCSW 03/11/2012 9:53 AM   Other:  03/11/2012 9:53 AM  Other:    Other:     Other:    Scribe for Treatment Team:  Clarice Pole, LCASA 03/11/2012 9:53 AM
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
## 2012-03-11 NOTE — Progress Notes (Signed)
[facility] Group Notes: (Counselor/Nursing/MHT/Case Management/Adjunct) 03/11/2012   @11 :00am Preventing Relapse  Type of Therapy:  Group Therapy  Participation Level:  Did Not Attend   Sabrina Ortiz 03/11/2012 3:42 PM

## 2012-03-11 NOTE — Discharge Summary (Signed)
I agree with this D/C Summary.  

## 2012-03-11 NOTE — Progress Notes (Signed)
[facility] Case Management Discharge Plan:  Will you be returning to the same living situation after discharge: Yes,  pt. returning to home with 41 year old son residing there as well. At discharge, do you have transportation home?:Yes,    Do you have the ability to pay for your medications:Yes,    Interagency Information:     Release of information consent forms completed and in the chart;  Patient's signature needed at discharge.  Patient to Follow up at:  Follow-up Information    Follow up with [facility] on 03/11/2012. (Please visit the walk-in clinic ASAP M-F from 8-3pm for medication follow-up.  )    Contact information:   [address], [place] [address] [phone]         Patient denies SI/HI:   Yes,       Safety Planning and Suicide Prevention discussed:  Yes,     Barrier to discharge identified:No.  Summary and Recommendations: Pt. Stated she missed scheduled appointment for mediation management at [facility] as she was in the hospital.  Pt to follow-up with Walk-in clinic at [facility] ASAP for appointment for therapy and medication management.    Lilia Pro 03/11/2012, 12:49 PM

## 2012-03-11 NOTE — Progress Notes (Signed)
Patient ID: Sabrina Ortiz, female   DOB: 1970/09/14, 41 y.o.   MRN: [id number] 03-11-12@1345  pt stated she was ready for d/c. Her f/u is with [facility]. She denied any si/hi/av. She got her belongings, her scripts and her sample medications. She stated she understood her d/c instructions. Her family member will transport her.

## 2012-03-11 NOTE — Progress Notes (Signed)
Currently resting quietly in bed with eyes closed. Respirations are even and unlabored. No acute distress noted. Safety has been maintained with Q15 minute observation. Will continue Q15 minute observation and continue current POC. 

## 2012-03-11 NOTE — Progress Notes (Signed)
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  Sleep: Trazodone helps will restart Appetite: The patient reports that her appetite is "up and down."  Mild>(1-10) >Severe  Hopelessness (1-10): 10  Depression (1-10): 10  Anxiety (1-10): 10  Suicidal Ideation: The patient reports suicidal ideations today but with no plan or intent.  Plan: No  Intent: No.  Means: No  Homicidal Ideation: The patient denies any homicidal ideations today.  Plan: No  Intent: No.  Means: No  General Appearance/Behavior: The patient was cooperative today with this provider but appeared still somewhat depressed.  She feels better now that she is restarted on her medications. Eye Contact: Fair.  Speech: Appropriate in rate and volume with no pressuring noted today.  Motor Behavior: wnl  Level of Consciousness: Alert and Oriented x 3.  Mental Status: Alert and Oriented x 3.  Mood: Appears slightly depressed today.  Affect: Flat.  Anxiety Level: Some anxiety reported today.  Thought Process: wnl  Thought Content: The patient denies any current auditory or visual hallucinations today as well as any delusional thinking. She does state at times her deceased Mother visits and speaks with her.  Perception: wnl  Judgment: Good.  Insight: Good.  Cognition: Oriented to person, place and time.  Review of Systems:  Neurological: The patient denies any headaches today. She denies any seizures or dizziness.  G.I.: The patient denies any constipation or any G.I. Upset today.  Musculoskeletal: The patient denies any musculoskeletal issues today. Still struggles with her back adn could use Elavil in small quantities  Discussed restarting the effective Wellbutrin and Trazodone for her.  Current Medications:  Current facility-administered  medications:acetaminophen (TYLENOL) tablet 650 mg, 650 mg, Oral, Q6H PRN, Curlene Labrum Readling, MD;  alum & mag hydroxide-simeth (MAALOX/MYLANTA) 200-200-20 MG/5ML suspension 30 mL, 30 mL, Oral, Q4H PRN, Curlene Labrum Readling, MD;  buPROPion (WELLBUTRIN SR) 12 hr tablet 100 mg, 100 mg, Oral, BID, Mike Craze, MD, 100 mg at 03/11/12 0757 gabapentin (NEURONTIN) capsule 300 mg, 300 mg, Oral, QHS, Randy D Readling, MD, 300 mg at 03/10/12 2148;  hydrochlorothiazide (HYDRODIURIL) tablet 25 mg, 25 mg, Oral, Daily, Curlene Labrum Readling, MD, 25 mg at 03/11/12 0757;  hydrOXYzine (ATARAX/VISTARIL) tablet 25 mg, 25 mg, Oral, Q6H PRN, Curlene Labrum Readling, MD, 25 mg at 03/09/12 2108;  lisinopril (PRINIVIL,ZESTRIL) tablet 20 mg, 20 mg, Oral, Daily, Curlene Labrum Readling, MD, 20 mg at 03/11/12 0757 magnesium hydroxide (MILK OF MAGNESIA) suspension 30 mL, 30 mL, Oral, Daily PRN, Curlene Labrum Readling, MD;  pantoprazole (PROTONIX) EC tablet 40 mg, 40 mg, Oral, Daily, Curlene Labrum Readling, MD, 40 mg at 03/11/12 0757;  sertraline (ZOLOFT) tablet 50 mg, 50 mg, Oral, Daily, Curlene Labrum Readling, MD, 50 mg at 03/11/12 0757;  simvastatin (ZOCOR) tablet 20 mg, 20 mg, Oral, QHS, Randy D Readling, MD, 20 mg at 03/10/12 2148 traZODone (DESYREL) tablet 50 mg, 50 mg, Oral, QHS, Mike Craze, MD, 50 mg at 03/10/12 2148;  DISCONTD: ARIPiprazole (ABILIFY) tablet 10 mg, 10 mg, Oral, Daily, Curlene Labrum Readling, MD, 10 mg at 03/10/12 0816  Treatment Plan Summary:  1. Daily contact with patient to assess and evaluate symptoms and progress in treatment.  2. Medication management  3. The patient will deny suicidal ideations or homicidal ideations for 48 hours prior to discharge and have a depression and anxiety  rating of 3 or less. The patient will also deny any auditory or visual hallucinations or delusional thinking.  4. The patient will deny any symptoms of substance withdrawal at time of discharge.  Plan:  1.  D/C today, has met inpatient goals.  SUICIDE RISK:   MILD.

## 2012-03-15 NOTE — Progress Notes (Signed)
Patient Discharge Instructions:  After Visit Summary (AVS):   Faxed to:  03/14/2012 Access to EMR:  03/14/2012 Psychiatric Admission Assessment Note:   Faxed to:  03/14/2012 Access to EMR:  03/14/2012 Suicide Risk Assessment - Discharge Assessment:   Faxed to:  03/14/2012 Access to EMR:  03/14/2012 Faxed/Sent to the Next Level Care provider:  03/14/2012 Next Level Care Provider Has Access to the EMR, 03/14/2012  Faxed to [facility] @ [phone] And records provided to [facility] Kristen Nordan via [facility] access.  Wandra Scot, 03/15/2012, 3:42 PM

## 2013-03-14 ENCOUNTER — Encounter (HOSPITAL_COMMUNITY): Payer: Self-pay | Admitting: Emergency Medicine

## 2013-03-14 ENCOUNTER — Emergency Department (HOSPITAL_COMMUNITY): Payer: No Typology Code available for payment source

## 2013-03-14 ENCOUNTER — Emergency Department (HOSPITAL_COMMUNITY)
Admission: EM | Admit: 2013-03-14 | Discharge: 2013-03-14 | Disposition: A | Discharge status: Home / Self Care | Payer: No Typology Code available for payment source | Attending: Emergency Medicine | Admitting: Emergency Medicine

## 2013-03-14 DIAGNOSIS — Z8659 Personal history of other mental and behavioral disorders: Secondary | ICD-10-CM | POA: Insufficient documentation

## 2013-03-14 DIAGNOSIS — I10 Essential (primary) hypertension: Secondary | ICD-10-CM | POA: Insufficient documentation

## 2013-03-14 DIAGNOSIS — F3289 Other specified depressive episodes: Secondary | ICD-10-CM | POA: Insufficient documentation

## 2013-03-14 DIAGNOSIS — F411 Generalized anxiety disorder: Secondary | ICD-10-CM | POA: Insufficient documentation

## 2013-03-14 DIAGNOSIS — F17200 Nicotine dependence, unspecified, uncomplicated: Secondary | ICD-10-CM | POA: Insufficient documentation

## 2013-03-14 DIAGNOSIS — F329 Major depressive disorder, single episode, unspecified: Secondary | ICD-10-CM | POA: Insufficient documentation

## 2013-03-14 DIAGNOSIS — S139XXA Sprain of joints and ligaments of unspecified parts of neck, initial encounter: Secondary | ICD-10-CM | POA: Insufficient documentation

## 2013-03-14 DIAGNOSIS — Y92410 Unspecified street and highway as the place of occurrence of the external cause: Secondary | ICD-10-CM | POA: Insufficient documentation

## 2013-03-14 DIAGNOSIS — Z8739 Personal history of other diseases of the musculoskeletal system and connective tissue: Secondary | ICD-10-CM | POA: Insufficient documentation

## 2013-03-14 DIAGNOSIS — S161XXA Strain of muscle, fascia and tendon at neck level, initial encounter: Secondary | ICD-10-CM

## 2013-03-14 DIAGNOSIS — Z79899 Other long term (current) drug therapy: Secondary | ICD-10-CM | POA: Insufficient documentation

## 2013-03-14 DIAGNOSIS — Y9389 Activity, other specified: Secondary | ICD-10-CM | POA: Insufficient documentation

## 2013-03-14 MED ORDER — HYDROCODONE-ACETAMINOPHEN 5-325 MG PO TABS: 2.0000 | ORAL_TABLET | Freq: Four times a day (QID) | ORAL | Status: DC | PRN

## 2013-03-14 MED ORDER — METHOCARBAMOL 500 MG PO TABS: 500.0000 mg | ORAL_TABLET | Freq: Two times a day (BID) | ORAL | Status: DC

## 2013-03-14 MED ORDER — IBUPROFEN 800 MG PO TABS: 800.0000 mg | ORAL_TABLET | Freq: Three times a day (TID) | ORAL | Status: DC
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
## 2013-03-15 NOTE — ED Provider Notes (Signed)
Medical screening examination/treatment/procedure(s) were performed by non-physician practitioner and as supervising physician I was immediately available for consultation/collaboration.  Doug Sou, MD 03/15/13 [phone]

## 2013-05-12 ENCOUNTER — Encounter (HOSPITAL_COMMUNITY): Payer: Self-pay

## 2013-05-12 ENCOUNTER — Emergency Department (HOSPITAL_COMMUNITY)
Admission: EM | Admit: 2013-05-12 | Discharge: 2013-05-13 | Disposition: A | Discharge status: Home / Self Care | Payer: Medicaid Other | Attending: Emergency Medicine | Admitting: Emergency Medicine

## 2013-05-12 DIAGNOSIS — F3289 Other specified depressive episodes: Secondary | ICD-10-CM | POA: Insufficient documentation

## 2013-05-12 DIAGNOSIS — M549 Dorsalgia, unspecified: Secondary | ICD-10-CM | POA: Insufficient documentation

## 2013-05-12 DIAGNOSIS — H539 Unspecified visual disturbance: Secondary | ICD-10-CM | POA: Insufficient documentation

## 2013-05-12 DIAGNOSIS — F4310 Post-traumatic stress disorder, unspecified: Secondary | ICD-10-CM | POA: Insufficient documentation

## 2013-05-12 DIAGNOSIS — F17200 Nicotine dependence, unspecified, uncomplicated: Secondary | ICD-10-CM | POA: Insufficient documentation

## 2013-05-12 DIAGNOSIS — Z3202 Encounter for pregnancy test, result negative: Secondary | ICD-10-CM | POA: Insufficient documentation

## 2013-05-12 DIAGNOSIS — R110 Nausea: Secondary | ICD-10-CM | POA: Insufficient documentation

## 2013-05-12 DIAGNOSIS — I10 Essential (primary) hypertension: Secondary | ICD-10-CM

## 2013-05-12 DIAGNOSIS — F411 Generalized anxiety disorder: Secondary | ICD-10-CM | POA: Insufficient documentation

## 2013-05-12 DIAGNOSIS — F430 Acute stress reaction: Secondary | ICD-10-CM | POA: Insufficient documentation

## 2013-05-12 DIAGNOSIS — F329 Major depressive disorder, single episode, unspecified: Secondary | ICD-10-CM

## 2013-05-12 DIAGNOSIS — Z79899 Other long term (current) drug therapy: Secondary | ICD-10-CM | POA: Insufficient documentation

## 2013-05-12 DIAGNOSIS — G8929 Other chronic pain: Secondary | ICD-10-CM | POA: Insufficient documentation

## 2013-05-12 DIAGNOSIS — R51 Headache: Secondary | ICD-10-CM | POA: Insufficient documentation

## 2013-05-12 LAB — CBC
HCT: 37.6 % (ref 36.0–46.0)
MCH: 26.1 pg (ref 26.0–34.0)
MCV: 76.6 fL — ABNORMAL LOW (ref 78.0–100.0)
Platelets: 167 10*3/uL (ref 150–400)
RBC: 4.91 MIL/uL (ref 3.87–5.11)

## 2013-05-12 LAB — URINE MICROSCOPIC-ADD ON

## 2013-05-12 LAB — POCT I-STAT TROPONIN I

## 2013-05-12 LAB — BASIC METABOLIC PANEL
BUN: 7 mg/dL (ref 6–23)
CO2: 23 mEq/L (ref 19–32)
Calcium: 8.7 mg/dL (ref 8.4–10.5)
Creatinine, Ser: 0.93 mg/dL (ref 0.50–1.10)
Glucose, Bld: 131 mg/dL — ABNORMAL HIGH (ref 70–99)

## 2013-05-12 LAB — URINALYSIS, ROUTINE W REFLEX MICROSCOPIC
Bilirubin Urine: NEGATIVE
Glucose, UA: 100 mg/dL — AB
Ketones, ur: NEGATIVE mg/dL
Protein, ur: NEGATIVE mg/dL

## 2013-05-12 MED ORDER — METOCLOPRAMIDE HCL 5 MG/ML IJ SOLN
10.0000 mg | Freq: Once | INTRAMUSCULAR | Status: AC
  Administered 2013-05-13: 10 mg via INTRAVENOUS
  Filled 2013-05-12: qty 2

## 2013-05-12 MED ORDER — POTASSIUM CHLORIDE CRYS ER 20 MEQ PO TBCR
40.0000 meq | EXTENDED_RELEASE_TABLET | Freq: Once | ORAL | Status: AC
  Administered 2013-05-12: 40 meq via ORAL
  Filled 2013-05-12: qty 2

## 2013-05-12 MED ORDER — CEFTRIAXONE SODIUM 250 MG IJ SOLR
250.0000 mg | Freq: Once | INTRAMUSCULAR | Status: AC
  Administered 2013-05-13: 250 mg via INTRAMUSCULAR
  Filled 2013-05-12: qty 250

## 2013-05-12 MED ORDER — DIPHENHYDRAMINE HCL 50 MG/ML IJ SOLN
25.0000 mg | Freq: Once | INTRAMUSCULAR | Status: AC
  Administered 2013-05-13: 25 mg via INTRAVENOUS
  Filled 2013-05-12: qty 1

## 2013-05-12 MED ORDER — SODIUM CHLORIDE 0.9 % IV BOLUS (SEPSIS)
1000.0000 mL | Freq: Once | INTRAVENOUS | Status: AC
  Administered 2013-05-12: 1000 mL via INTRAVENOUS

## 2013-05-12 MED ORDER — AZITHROMYCIN 1 G PO PACK
1.0000 g | PACK | Freq: Once | ORAL | Status: AC
  Administered 2013-05-13: 1 g via ORAL
  Filled 2013-05-12: qty 1

## 2013-05-12 MED ORDER — LORAZEPAM 1 MG PO TABS
1.0000 mg | ORAL_TABLET | Freq: Once | ORAL | Status: AC
  Administered 2013-05-13: 1 mg via ORAL
  Filled 2013-05-12 (×2): qty 1

## 2013-05-12 MED ORDER — KETOROLAC TROMETHAMINE 30 MG/ML IJ SOLN
30.0000 mg | Freq: Once | INTRAMUSCULAR | Status: AC
  Administered 2013-05-13: 30 mg via INTRAVENOUS
  Filled 2013-05-12: qty 1

## 2013-05-12 NOTE — ED Notes (Signed)
Holding meds because Pt. Drove herself to hospital she is attempting to find a ride home.

## 2013-05-12 NOTE — ED Notes (Signed)
Pt requesting to hold on IV fluids and IV line, PA notified and states this is fine at this time.

## 2013-05-12 NOTE — ED Provider Notes (Signed)
Complains of diffuse headache and emotional stress for one week. Patient also states she ran out of her blood pressure medication 1.5 weeks ago. Exam patient alert Glasgow Coma Score 15 no distress. HEENT exam no facial asymmetry is was equal round react to light fundi benign neck supple neurologic gait normal Romberg normal prior drift normal tenderness to 12 grossly intact  Doug Sou, MD 05/12/13 2339

## 2013-05-12 NOTE — ED Notes (Signed)
Ativan held at this time due to patient not having a ride, pt is calling to see if she is able to get a ride.

## 2013-05-12 NOTE — ED Provider Notes (Signed)
CSN: [id number]     Arrival date & time 05/12/13  1636 History   First MD Initiated Contact with Patient 05/12/13 1841     Chief Complaint  Patient presents with  . Headache  . Stress   (Consider location/radiation/quality/duration/timing/severity/associated sxs/prior Treatment) The history is provided by the patient. No language interpreter was used.  Sabrina Ortiz is a 42 year old female with past medical history of mental disorder, depression, hypertension, anxiety, chronic back pain, postinfectious disorder presenting to the emergency department with headache, dizziness and stress dispense ongoing for the past week. Patient reported that she's been having this ongoing headache, bilateral to the frontal and temporal regions is been ongoing for the past week, described as an intermittent aching sensation localized behind the eyes. Patient reported that she has been having associated nausea with the headaches. Patient reported that she experienced a dizzy spell today while walking from the bathroom, reported she felt lightheaded. Patient reported that she's been taking hydrocodone which help with her headache today. Patient reported that she had loss of focus, blurred vision for approximately 5 minutes while watching TV and laying on her bed. Patient reported that she's been under a lot of stress, reported that she has a lot going on right now. Patient reports that she has a lot of personal issues that is leading to her increasing depression. Patient reports she has not been following up with her primary care provider, reported that she has not been taking her medications for high blood pressure for at least 10-11 days. Patient reports that she has not been monitoring her blood pressure like she normally does because she is scared to find out whether or not it is elevated. Patient reported that she has been having increased depression secondary to her increase in stress. Denied fall, head injury, loss of  consciousness, vomiting, diarrhea, abdominal pain, melena, hematochezia, chest pain, shortness of breath, difficulty breathing, suicidal ideation, self injury, homicidal ideation. PCP Dr. Esmond Harps  Past Medical History  Diagnosis Date  . Mental disorder   . Depression   . Hypertension   . Anxiety   . PTSD (post-traumatic stress disorder)   . Chronic back pain    History reviewed. No pertinent past surgical history. History reviewed. No pertinent family history. History  Substance Use Topics  . Smoking status: Current Every Day Smoker    Types: Cigarettes  . Smokeless tobacco: Not on file  . Alcohol Use: No   OB History   Grav Para Term Preterm Abortions TAB SAB Ect Mult Living                 Review of Systems  Constitutional: Positive for appetite change (baseline for patient due to her depression ). Negative for fever and chills.  HENT: Negative for sore throat, trouble swallowing and neck stiffness.   Eyes: Positive for visual disturbance.  Respiratory: Negative for chest tightness and shortness of breath.   Cardiovascular: Negative for chest pain.  Gastrointestinal: Positive for nausea. Negative for abdominal pain.  Musculoskeletal: Positive for back pain (chronic issue).  Neurological: Positive for dizziness and headaches. Negative for weakness and numbness.  Psychiatric/Behavioral: Positive for dysphoric mood. Negative for suicidal ideas and self-injury. The patient is nervous/anxious.   All other systems reviewed and are negative.    Allergies  Review of patient's allergies indicates no known allergies.  Home Medications   Current Outpatient Rx  Name  Route  Sig  Dispense  Refill  . gabapentin (NEURONTIN) 300 MG capsule  Oral   Take 1 capsule (300 mg total) by mouth at bedtime. For anxiety/discomfort   30 capsule   0   . HYDROcodone-acetaminophen (NORCO/VICODIN) 5-325 MG per tablet   Oral   Take 2 tablets by mouth every 6 (six) hours as needed for  pain.   15 tablet   0   . hydrOXYzine (ATARAX/VISTARIL) 25 MG tablet   Oral   Take 25 mg by mouth 3 (three) times daily as needed. For anxiety and sleep         . lisinopril-hydrochlorothiazide (PRINZIDE,ZESTORETIC) 20-25 MG per tablet   Oral   Take 1 tablet by mouth daily. For control of high blood pressure   30 tablet   0   . sertraline (ZOLOFT) 100 MG tablet   Oral   Take 0.5 tablets (50 mg total) by mouth daily. For depression.   15 tablet   0   . traZODone (DESYREL) 50 MG tablet   Oral   Take 50 mg by mouth at bedtime.         Marland Kitchen doxycycline (VIBRAMYCIN) 100 MG capsule   Oral   Take 1 capsule (100 mg total) by mouth 2 (two) times daily. One po bid x 7 days   14 capsule   0   . lisinopril-hydrochlorothiazide (PRINZIDE) 20-12.5 MG per tablet   Oral   Take 1 tablet by mouth daily.   15 tablet   0    BP 115/52  Pulse 64  Temp(Src) 98 F (36.7 C) (Oral)  Resp 23  SpO2 100%  LMP 04/26/2013 Physical Exam  Nursing note and vitals reviewed. Constitutional: She is oriented to person, place, and time. She appears well-developed and well-nourished. No distress.  HENT:  Head: Normocephalic and atraumatic.  Mouth/Throat: Oropharynx is clear and moist. No oropharyngeal exudate.  Eyes: Conjunctivae and EOM are normal. Pupils are equal, round, and reactive to light. Right eye exhibits no discharge. Left eye exhibits no discharge.  Negative nystagmus  Neck: Normal range of motion. Neck supple.  Cardiovascular: Normal rate, regular rhythm and normal heart sounds.  Exam reveals no friction rub.   No murmur heard. Pulmonary/Chest: Effort normal and breath sounds normal. No respiratory distress. She has no wheezes. She has no rales.  Musculoskeletal: Normal range of motion.  Lymphadenopathy:    She has no cervical adenopathy.  Neurological: She is alert and oriented to person, place, and time. No cranial nerve deficit. She exhibits normal muscle tone. Coordination normal.   Cranial nerves II through XII grossly intact Strength 5+/5+ with resistance to upper and lower extremities, equal distribution noted Gait proper, negative sway identified - steady gait  Sensation intact  Skin: Skin is warm and dry. No rash noted. She is not diaphoretic. No erythema.  Psychiatric: Her behavior is normal.  Flat affect     ED Course  Procedures (including critical care time)   Date: 05/12/2013  Rate: 71  Rhythm: normal sinus rhythm  QRS Axis: right  Intervals: QT prolonged  ST/T Wave abnormalities: nonspecific T wave changes  Conduction Disutrbances:none  Narrative Interpretation:   Old EKG Reviewed: changes noted when compared to EKG from 03/07/2012  Second EKG   Date: 05/12/2013  Rate: 67  Rhythm: normal sinus rhythm  QRS Axis: right  Intervals: normal  ST/T Wave abnormalities: nonspecific T wave changes  Conduction Disutrbances:none  Narrative Interpretation:   Old EKG Reviewed: unchanged when compared to EKG from 03/07/2012  11:00 PM Patient reported that her headache is still present.  Patient's blood pressure elevated to 154/99. Patient reported that she has mild blurred vision identified, mild in and out. When asked regarding Trichomonias in her urine, patient reported that she is sexually active, does not use protection. Reported that her last encounter was a couple of weeks ago. Treated patient prophylactically with ceftriaxone and azithromycin.   11:30 PM patient seen and assessed by Dr. Dub Mikes, cleared patient for discharge.  Labs Review Labs Reviewed  CBC - Abnormal; Notable for the following:    MCV 76.6 (*)    RDW 15.8 (*)    All other components within normal limits  BASIC METABOLIC PANEL - Abnormal; Notable for the following:    Potassium 3.3 (*)    Glucose, Bld 131 (*)    GFR calc non Af Amer 75 (*)    GFR calc Af Amer 87 (*)    All other components within normal limits  URINALYSIS, ROUTINE W REFLEX MICROSCOPIC - Abnormal; Notable  for the following:    APPearance CLOUDY (*)    Glucose, UA 100 (*)    Hgb urine dipstick TRACE (*)    Leukocytes, UA LARGE (*)    All other components within normal limits  URINE MICROSCOPIC-ADD ON - Abnormal; Notable for the following:    Squamous Epithelial / LPF FEW (*)    Bacteria, UA MANY (*)    All other components within normal limits  URINE CULTURE  POCT PREGNANCY, URINE  POCT I-STAT TROPONIN I   Imaging Review No results found.  MDM   1. Headache   2. Hypertension   3. Depression     Patient presenting to emergency department with intermittent headache has been ongoing x1 week with episode of dizziness and mild visual distortion today the last approximately 5 minutes as per patient. Patient's long history of depression, reported increasing depression secondary to stress related issues. Denied suicidal ideation, homicidal ideation. Alert and oriented. Eyes normal, PERRLA bilaterally. Full range of motion to upper and lower tremors bilaterally. Lungs clear to auscultation bilaterally. Heart rate and rhythm normal. Cranial nerves III through XII grossly intact. Strength intact. Negative neurological deficits identified. Pulses palpable. Primary EKG showed QT prolongation, second EKG showed negative findings-when compared to previous EKG from 03/07/2012 there is negative findings. Troponin negative elevation. BMP mild hypokalemia identified, 3.3, potassium by mouth administered. CBC negative findings. Urine noted trace of blood with large leukocytes and many bacteria noted with presence of Trichomonas. Urine pregnancy negative. Suspicion for headache to be associated with poor control of blood pressure-patient reported that she has not been on her blood pressure medications for the past 10-11 days. Association of headache also do to increased stress, patient has a lot of personal issues going on at the moment and does not follow psychological expertise. Headache and chills in ED  setting. Patient treated prophylactically for gonorrhea and Chlamydia. Discussed case with Dr. Dub Mikes, saw and assess patient cleared patient for discharge. Patient stable, afebrile. Discharged patient with antihypertensive medications-discussed with patient to followup with [facility] to be reevaluated next week for repeat check of blood pressure and for continuation of health and guidance. Discussed with patient diet. Discharge patient with doxycycline for coverage of infection. Referred patient to STD clinic for further workup to be performed regarding such has been diseases. Educated patient on safe sex habits. Discussed with patient to refrain from sexual activity for this can result in transmission of the infection. Discussed with patient that if her blood pressures did greater  than 200's/100's she is to report back to emergency department immediately.Discussed with patient to continue monitor symptoms and if symptoms are to worsen or change report back to emergency apartment immediately - return structures given. Patient agreed to plan of care, understood, all questions answered.    Raymon Mutton, PA-C 05/14/13 0136

## 2013-05-12 NOTE — ED Notes (Signed)
Pt reports she is out of her Lisinipril, Protonix, and Pravastatin. Pt reports taking a pain pill prior to arrival with no relief

## 2013-05-12 NOTE — ED Notes (Addendum)
Pt c/o headache, dizziness, lightheaded, and increase stress x1 week. Pt reports she ran out of her HTN medication 11 or 12 days ago. Pt is currently uninsured and is not able to f/u with her PCP, she has applied for insurance and is pending at this time. No neuro deficits noted, no facial droop or arm drift

## 2013-05-13 MED ORDER — LISINOPRIL-HYDROCHLOROTHIAZIDE 20-12.5 MG PO TABS: 1.0000 | ORAL_TABLET | Freq: Every day | ORAL | Status: DC

## 2013-05-13 MED ORDER — LIDOCAINE HCL (PF) 1 % IJ SOLN
INTRAMUSCULAR | Status: AC
  Administered 2013-05-13: 2 mL
  Filled 2013-05-13: qty 5

## 2013-05-13 MED ORDER — DOXYCYCLINE HYCLATE 100 MG PO CAPS: 100.0000 mg | ORAL_CAPSULE | Freq: Two times a day (BID) | ORAL | Status: DC

## 2013-05-14 NOTE — ED Provider Notes (Signed)
Medical screening examination/treatment/procedure(s) were conducted as a shared visit with non-physician practitioner(s) and myself.  I personally evaluated the patient during the encounter  Doug Sou, MD 05/14/13 [phone]

## 2013-05-15 LAB — URINE CULTURE

## 2013-05-30 ENCOUNTER — Ambulatory Visit: Payer: Self-pay

## 2013-06-15 ENCOUNTER — Ambulatory Visit: Payer: No Typology Code available for payment source | Attending: Internal Medicine | Admitting: Internal Medicine

## 2013-06-15 ENCOUNTER — Encounter: Payer: Self-pay | Admitting: Internal Medicine

## 2013-06-15 VITALS — BP 128/85 | HR 64 | Temp 98.8°F | Resp 16 | Ht 66.0 in | Wt 172.0 lb

## 2013-06-15 DIAGNOSIS — F411 Generalized anxiety disorder: Secondary | ICD-10-CM

## 2013-06-15 DIAGNOSIS — I10 Essential (primary) hypertension: Secondary | ICD-10-CM | POA: Insufficient documentation

## 2013-06-15 DIAGNOSIS — F333 Major depressive disorder, recurrent, severe with psychotic symptoms: Secondary | ICD-10-CM

## 2013-06-15 DIAGNOSIS — G589 Mononeuropathy, unspecified: Secondary | ICD-10-CM

## 2013-06-15 DIAGNOSIS — G629 Polyneuropathy, unspecified: Secondary | ICD-10-CM

## 2013-06-15 DIAGNOSIS — E785 Hyperlipidemia, unspecified: Secondary | ICD-10-CM

## 2013-06-15 LAB — LIPID PANEL
Cholesterol: 202 mg/dL — ABNORMAL HIGH (ref 0–200)
HDL: 49 mg/dL
LDL Cholesterol: 129 mg/dL — ABNORMAL HIGH (ref 0–99)
Total CHOL/HDL Ratio: 4.1 ratio
Triglycerides: 119 mg/dL
VLDL: 24 mg/dL (ref 0–40)

## 2013-06-15 LAB — POCT GLYCOSYLATED HEMOGLOBIN (HGB A1C): Hemoglobin A1C: 6.1

## 2013-06-15 MED ORDER — SERTRALINE HCL 50 MG PO TABS: 50.0000 mg | ORAL_TABLET | Freq: Every day | ORAL | Status: DC

## 2013-06-15 MED ORDER — ALPRAZOLAM 1 MG PO TABS: 1.0000 mg | ORAL_TABLET | Freq: Two times a day (BID) | ORAL | Status: DC | PRN

## 2013-06-15 MED ORDER — LEVOCETIRIZINE DIHYDROCHLORIDE 5 MG PO TABS: 5.0000 mg | ORAL_TABLET | Freq: Every evening | ORAL | Status: DC

## 2013-06-15 MED ORDER — PANTOPRAZOLE SODIUM 40 MG PO TBEC: 40.0000 mg | DELAYED_RELEASE_TABLET | Freq: Every day | ORAL | Status: DC

## 2013-06-15 MED ORDER — TRAZODONE HCL 50 MG PO TABS: 50.0000 mg | ORAL_TABLET | Freq: Every day | ORAL | Status: DC

## 2013-06-15 MED ORDER — LISINOPRIL-HYDROCHLOROTHIAZIDE 20-12.5 MG PO TABS: 1.0000 | ORAL_TABLET | Freq: Every day | ORAL | Status: DC

## 2013-06-15 MED ORDER — PRAVASTATIN SODIUM 40 MG PO TABS: 40.0000 mg | ORAL_TABLET | Freq: Every day | ORAL | Status: DC

## 2013-06-15 MED ORDER — GABAPENTIN 300 MG PO CAPS: 300.0000 mg | ORAL_CAPSULE | Freq: Every day | ORAL | Status: DC

## 2013-06-15 NOTE — Progress Notes (Signed)
Patient ID: Sabrina Ortiz, female   DOB: 09-08-1970, 42 y.o.   MRN: [id number]  CC: New patient  HPI: 42 year old female with past medical history of anxiety and depression, hypertension, dyslipidemia who comes in to establish PCP. Patient previously had PCP but the person has left the office and she decided to switch to a new practice. Patient reports having occasional insomnia but this is improved with gabapentin at nighttime. Her depression and anxiety is under control with Zoloft and Xanax as needed. Patient is compliant with medications. No chest pain or palpitations. No abdominal pain, nausea or vomiting. No blood in the stool or urine.  No Known Allergies Past Medical History  Diagnosis Date  . Mental disorder   . Depression   . Hypertension   . Anxiety   . PTSD (post-traumatic stress disorder)   . Chronic back pain    No current outpatient prescriptions on file prior to visit.   No current facility-administered medications on file prior to visit.   Family history significant for hypothyroidism in mother.  History   Social History  . Marital Status: Single    Spouse Name: N/A    Number of Children: N/A  . Years of Education: N/A   Occupational History  . Not on file.   Social History Main Topics  . Smoking status: Current Every Day Smoker    Types: Cigarettes  . Smokeless tobacco: Not on file  . Alcohol Use: No  . Drug Use: No  . Sexual Activity:    Other Topics Concern  . Not on file   Social History Narrative  . No narrative on file    Review of Systems  Constitutional: Negative for fever, chills, diaphoresis, activity change, appetite change and fatigue.  HENT: Negative for ear pain, nosebleeds, congestion, facial swelling, rhinorrhea, neck pain, neck stiffness and ear discharge.   Eyes: Negative for pain, discharge, redness, itching and visual disturbance.  Respiratory: Negative for cough, choking, chest tightness, shortness of breath, wheezing and  stridor.   Cardiovascular: Negative for chest pain, palpitations and leg swelling.  Gastrointestinal: Negative for abdominal distention.  Genitourinary: Negative for dysuria, urgency, frequency, hematuria, flank pain, decreased urine volume, difficulty urinating and dyspareunia.  Musculoskeletal: Negative for back pain, joint swelling, arthralgias and gait problem.  Neurological: Negative for dizziness, tremors, seizures, syncope, facial asymmetry, speech difficulty, weakness, light-headedness, numbness and headaches.  positive for tingling sensation in her leg Hematological: Negative for adenopathy. Does not bruise/bleed easily.  Psychiatric/Behavioral: Negative for hallucinations, behavioral problems, confusion, dysphoric mood, decreased concentration and agitation.  positive for insomnia   Objective:   Filed Vitals:   06/15/13 1132  BP: 128/85  Pulse: 64  Temp: 98.8 F (37.1 C)  Resp: 16    Physical Exam  Constitutional: Appears well-developed and well-nourished. No distress.  HENT: Normocephalic. External right and left ear normal. Oropharynx is clear and moist.  Eyes: Conjunctivae and EOM are normal. PERRLA, no scleral icterus.  Neck: Normal ROM. Neck supple. No JVD. No tracheal deviation. No thyromegaly.  CVS: RRR, S1/S2 +, no murmurs, no gallops, no carotid bruit.  Pulmonary: Effort and breath sounds normal, no stridor, rhonchi, wheezes, rales.  Abdominal: Soft. BS +,  no distension, tenderness, rebound or guarding.  Musculoskeletal: Normal range of motion. No edema and no tenderness.  Lymphadenopathy: No lymphadenopathy noted, cervical, inguinal. Neuro: Alert. Normal reflexes, muscle tone coordination. No cranial nerve deficit. Skin: Skin is warm and dry. No rash noted. Not diaphoretic. No erythema. No pallor.  Psychiatric: Normal mood and affect. Behavior, judgment, thought content normal.   Lab Results  Component Value Date   WBC 8.1 05/12/2013   HGB 12.8 05/12/2013   HCT  37.6 05/12/2013   MCV 76.6* 05/12/2013   PLT 167 05/12/2013   Lab Results  Component Value Date   CREATININE 0.93 05/12/2013   BUN 7 05/12/2013   NA 136 05/12/2013   K 3.3* 05/12/2013   CL 104 05/12/2013   CO2 23 05/12/2013    No results found for this basename: HGBA1C   Lipid Panel  No results found for this basename: chol, trig, hdl, cholhdl, vldl, ldlcalc       Assessment and plan:   Patient Active Problem List   Diagnosis Date Noted  . Unspecified essential hypertension 06/15/2013    Priority: Medium - We have discussed target BP range - I have advised pt to check BP regularly and to call us back if the numbers are higher than 140/90 - discussed the importance of compliance with medical therapy and diet  - continue prinizide  . Dyslipidemia 06/15/2013    Priority: Medium - Continue Pravachol 40 mg at bedtime  - Check lipid panel today   . Neuropathy 06/15/2013    Priority: Medium - Continue gabapentin 300 mg at bedtime   .  Preventative health  03/10/2012    Priority: Medium - Referral to GYN provided   . Major depressive disorder and anxiety disorder  03/08/2012    Priority: Medium - Continue Zoloft and Xanax as prescribed  - Referral to psychiatry provided

## 2013-06-15 NOTE — Patient Instructions (Signed)

## 2013-06-15 NOTE — Progress Notes (Signed)
HFU Pt reports having HTN and STD trichomoniasis Pt. Has chronic lower back pain. Pain is a 5, constant and aching.

## 2013-06-23 ENCOUNTER — Ambulatory Visit: Payer: No Typology Code available for payment source | Attending: Internal Medicine

## 2013-07-13 ENCOUNTER — Other Ambulatory Visit: Payer: Self-pay

## 2013-07-19 ENCOUNTER — Ambulatory Visit: Payer: Self-pay | Attending: Internal Medicine | Admitting: Internal Medicine

## 2013-07-19 ENCOUNTER — Encounter: Payer: Self-pay | Admitting: Internal Medicine

## 2013-07-19 VITALS — BP 121/80 | HR 74 | Temp 98.5°F | Resp 16 | Ht 66.0 in | Wt 174.2 lb

## 2013-07-19 DIAGNOSIS — F3289 Other specified depressive episodes: Secondary | ICD-10-CM | POA: Insufficient documentation

## 2013-07-19 DIAGNOSIS — I10 Essential (primary) hypertension: Secondary | ICD-10-CM | POA: Insufficient documentation

## 2013-07-19 DIAGNOSIS — F333 Major depressive disorder, recurrent, severe with psychotic symptoms: Secondary | ICD-10-CM

## 2013-07-19 DIAGNOSIS — F329 Major depressive disorder, single episode, unspecified: Secondary | ICD-10-CM | POA: Insufficient documentation

## 2013-07-19 DIAGNOSIS — E785 Hyperlipidemia, unspecified: Secondary | ICD-10-CM

## 2013-07-19 DIAGNOSIS — F411 Generalized anxiety disorder: Secondary | ICD-10-CM | POA: Insufficient documentation

## 2013-07-19 NOTE — Progress Notes (Signed)
Patient states here for physical Has history of anxiety depression HTN

## 2013-07-19 NOTE — Progress Notes (Signed)
Patient Demographics  Sabrina Ortiz, is a 42 y.o. female  ZOX:[id number]  WJX:[id number]  DOB - 1971/01/04  Chief Complaint  Patient presents with  . Annual Exam        Subjective:   Sabrina Ortiz today is here for a follow up visit. She has no major concerns except for a small "rash" in the back of her left thigh. No fever. She claims she is compliant with medications, she follows up with her psychiatrist at [facility] and is on multiple -psychiatric medications  Patient has No headache, No chest pain, No abdominal pain - No Nausea, No new weakness tingling or numbness, No Cough - SOB.   Objective:    Filed Vitals:   07/19/13 1148  BP: 121/80  Pulse: 74  Temp: 98.5 F (36.9 C)  Resp: 16  Height: 5\' 6"  (1.676 m)  Weight: 174 lb 3.2 oz (79.017 kg)  SpO2: 100%     ALLERGIES:  No Known Allergies  PAST MEDICAL HISTORY: Past Medical History  Diagnosis Date  . Mental disorder   . Depression   . Hypertension   . Anxiety   . PTSD (post-traumatic stress disorder)   . Chronic back pain     MEDICATIONS AT HOME: Prior to Admission medications   Medication Sig Start Date End Date Taking? Authorizing Provider  ALPRAZolam Prudy Feeler) 1 MG tablet Take 1 tablet (1 mg total) by mouth 2 (two) times daily as needed for sleep or anxiety. 06/15/13   Alison Murray, MD  gabapentin (NEURONTIN) 300 MG capsule Take 1 capsule (300 mg total) by mouth at bedtime. For anxiety/discomfort 06/15/13   Alison Murray, MD  levocetirizine (XYZAL) 5 MG tablet Take 1 tablet (5 mg total) by mouth every evening. 06/15/13   Alison Murray, MD  lisinopril-hydrochlorothiazide (PRINZIDE) 20-12.5 MG per tablet Take 1 tablet by mouth daily. 06/15/13   Alison Murray, MD  pantoprazole (PROTONIX) 40 MG tablet Take 1 tablet (40 mg total) by mouth daily. 06/15/13   Alison Murray, MD  pravastatin (PRAVACHOL) 40 MG tablet Take 1 tablet (40 mg total) by mouth daily. 06/15/13   Alison Murray, MD  sertraline (ZOLOFT) 50 MG tablet  Take 1 tablet (50 mg total) by mouth daily. For depression. 06/15/13   Alison Murray, MD  traZODone (DESYREL) 50 MG tablet Take 1 tablet (50 mg total) by mouth at bedtime. 06/15/13   Alison Murray, MD     Exam  General appearance :Awake, alert, not in any distress. Speech Clear. Not toxic Looking HEENT: Atraumatic and Normocephalic, pupils equally reactive to light and accomodation Neck: supple, no JVD. No cervical lymphadenopathy.  Chest:Good air entry bilaterally, no added sounds  CVS: S1 S2 regular, no murmurs.  Abdomen: Bowel sounds present, Non tender and not distended with no gaurding, rigidity or rebound. Extremities: B/L Lower Ext shows no edema, both legs are warm to touch Neurology: Awake alert, and oriented X 3, CN II-XII intact, Non focal Skin:1 small vesicular lesion in the left posterior thigh.No surrounding erythema Wounds:N/A    Data Review   CBC No results found for this basename: WBC, HGB, HCT, PLT, MCV, MCH, MCHC, RDW, NEUTRABS, LYMPHSABS, MONOABS, EOSABS, BASOSABS, BANDABS, BANDSABD,  in the last 168 hours  Chemistries   No results found for this basename: NA, K, CL, CO2, GLUCOSE, BUN, CREATININE, GFRCGP, CALCIUM, MG, AST, ALT, ALKPHOS, BILITOT,  in the last 168 hours ------------------------------------------------------------------------------------------------------------------ No results found for this basename: HGBA1C,  in the last  72 hours ------------------------------------------------------------------------------------------------------------------ No results found for this basename: CHOL, HDL, LDLCALC, TRIG, CHOLHDL, LDLDIRECT,  in the last 72 hours ------------------------------------------------------------------------------------------------------------------ No results found for this basename: TSH, T4TOTAL, FREET3, T3FREE, THYROIDAB,  in the last 72  hours ------------------------------------------------------------------------------------------------------------------ No results found for this basename: VITAMINB12, FOLATE, FERRITIN, TIBC, IRON, RETICCTPCT,  in the last 72 hours  Coagulation profile  No results found for this basename: INR, PROTIME,  in the last 168 hours    Assessment & Plan  Hypertension - Controlled - Continue with lisinopril/HCTZ  Dyslipidemia - Continue with Pravachol, and recheck lipids and early next year  Depression/anxiety - She see psychiatry at [facility]- she requests that if her psychiatrist provider can be changed-I will ask our social worker to evaluate her and assist her- however she knows that she needs to follow up with the psychiatrist, she is on multiple psychiatric medications-we will defer further management to her primary psychiatrist  ? 1 vesicular lesion in the left posterior thigh - Monitor for now-patient said he just occurred a few days ago-? Developing zoster-I've asked the patient to make a followup appointment if she sees any other lesions erupting.  Health Maintenance -Pap Smear: Refer to GYN -Mammogram: Refer to GYN  Follow up in 6 weeks   The patient was given clear instructions to go to ER or return to medical center if symptoms don't improve, worsen or new problems develop. The patient verbalized understanding. The patient was told to call to get lab results if they haven't heard anything in the next week.

## 2013-07-24 ENCOUNTER — Other Ambulatory Visit: Payer: Self-pay | Admitting: Internal Medicine

## 2013-07-24 DIAGNOSIS — Z1231 Encounter for screening mammogram for malignant neoplasm of breast: Secondary | ICD-10-CM

## 2013-08-08 ENCOUNTER — Other Ambulatory Visit: Payer: Self-pay | Admitting: Internal Medicine

## 2013-08-08 DIAGNOSIS — Z1231 Encounter for screening mammogram for malignant neoplasm of breast: Secondary | ICD-10-CM

## 2013-08-09 ENCOUNTER — Other Ambulatory Visit: Payer: Self-pay | Admitting: Internal Medicine

## 2013-08-09 ENCOUNTER — Other Ambulatory Visit: Payer: Self-pay

## 2013-08-09 DIAGNOSIS — N63 Unspecified lump in unspecified breast: Secondary | ICD-10-CM

## 2013-08-10 ENCOUNTER — Ambulatory Visit
Admission: RE | Admit: 2013-08-10 | Discharge: 2013-08-10 | Disposition: A | Discharge status: Home / Self Care | Payer: No Typology Code available for payment source | Source: Ambulatory Visit | Attending: Internal Medicine | Admitting: Internal Medicine

## 2013-08-10 DIAGNOSIS — N63 Unspecified lump in unspecified breast: Secondary | ICD-10-CM

## 2013-08-28 ENCOUNTER — Ambulatory Visit: Payer: Self-pay | Admitting: Internal Medicine

## 2013-08-28 ENCOUNTER — Telehealth: Payer: Self-pay | Admitting: *Deleted

## 2013-08-28 NOTE — Telephone Encounter (Signed)
I spoke to the pt and informed her that her MM came back normal.

## 2013-08-28 NOTE — Telephone Encounter (Signed)
Message copied by Raynelle Chary on Mon Aug 28, 2013 12:29 PM ------      Message from: Quentin Angst      Created: Fri Aug 25, 2013  3:46 PM       Please inform patient that her mammogram shows no evidence of malignancy in either breast ------

## 2013-09-18 ENCOUNTER — Encounter: Payer: Self-pay | Admitting: Obstetrics & Gynecology

## 2013-09-18 ENCOUNTER — Ambulatory Visit (INDEPENDENT_AMBULATORY_CARE_PROVIDER_SITE_OTHER): Payer: Self-pay | Admitting: Obstetrics & Gynecology

## 2013-09-18 VITALS — BP 119/79 | HR 80 | Temp 98.8°F | Ht 64.0 in | Wt 171.4 lb

## 2013-09-18 DIAGNOSIS — Z113 Encounter for screening for infections with a predominantly sexual mode of transmission: Secondary | ICD-10-CM

## 2013-09-18 DIAGNOSIS — Z1151 Encounter for screening for human papillomavirus (HPV): Secondary | ICD-10-CM

## 2013-09-18 DIAGNOSIS — Z01419 Encounter for gynecological examination (general) (routine) without abnormal findings: Secondary | ICD-10-CM

## 2013-09-18 DIAGNOSIS — Z124 Encounter for screening for malignant neoplasm of cervix: Secondary | ICD-10-CM

## 2013-09-18 NOTE — Progress Notes (Signed)
   CLINIC ENCOUNTER NOTE  History:  43 y.o. [id number] here today for annual breast and pelvic exam and pap smear.  Normal mammogram in 08/2013.  [facility], had physical in 06/2013.  No GYN concerns. Desires STI screen.  The following portions of the patient's history were reviewed and updated as appropriate: allergies, current medications, past family history, past medical history, past social history, past surgical history and problem list.  Review of Systems:  Pertinent items are noted in HPI.  Objective:  Physical Exam BP 119/79  Pulse 80  Temp(Src) 98.8 F (37.1 C) (Oral)  Ht 5\' 4"  (1.626 m)  Wt 171 lb 6.4 oz (77.747 kg)  BMI 29.41 kg/m2  LMP 09/03/2013 GENERAL: Well-developed, well-nourished female in no acute distress.  BREASTS: Symmetric in size. No masses, skin changes, nipple drainage, or lymphadenopathy. ABDOMEN: Soft, nontender, nondistended. No organomegaly. PELVIC: Normal external female genitalia. Vagina is pink and rugated.  Normal discharge. Normal cervix contour. Pap smear obtained. Uterus is normal in size. No adnexal mass or tenderness.   Assessment & Plan:   Pap done, will follow up results and manage accordingly. Normal breast exam, normal mammogram STI screen done, will follow up results and manage accordingly. Routine preventative health maintenance measures emphasized    Jaynie CollinsUGONNA  ANYANWU, MD, FACOG Attending Obstetrician & Gynecologist Faculty Practice, [facility]

## 2013-09-18 NOTE — Patient Instructions (Signed)

## 2013-09-19 LAB — HIV ANTIBODY (ROUTINE TESTING W REFLEX): HIV: NONREACTIVE

## 2013-09-19 LAB — RPR

## 2013-09-19 LAB — HEPATITIS C ANTIBODY: HCV Ab: NEGATIVE

## 2013-09-19 LAB — HEPATITIS B SURFACE ANTIGEN: HEP B S AG: NEGATIVE

## 2013-09-29 ENCOUNTER — Telehealth: Payer: Self-pay

## 2013-09-29 ENCOUNTER — Encounter: Payer: Self-pay | Admitting: Internal Medicine

## 2013-09-29 NOTE — Telephone Encounter (Signed)
Patient has been having flu like symptoms past couple of days Did not receive flu vaccine this year Advised her to increase fluids BRAT diet Light meals until the diarrhea subsides

## 2013-11-10 ENCOUNTER — Other Ambulatory Visit: Payer: Self-pay | Admitting: Pharmacist

## 2013-11-10 DIAGNOSIS — E785 Hyperlipidemia, unspecified: Secondary | ICD-10-CM

## 2013-11-10 DIAGNOSIS — I10 Essential (primary) hypertension: Secondary | ICD-10-CM

## 2013-11-10 MED ORDER — ATORVASTATIN CALCIUM 10 MG PO TABS: 10.0000 mg | ORAL_TABLET | Freq: Every day | ORAL | Status: DC

## 2013-11-10 MED ORDER — LISINOPRIL-HYDROCHLOROTHIAZIDE 20-12.5 MG PO TABS: 1.0000 | ORAL_TABLET | Freq: Every day | ORAL | Status: DC

## 2013-11-11 ENCOUNTER — Other Ambulatory Visit: Payer: Self-pay

## 2013-11-11 MED ORDER — OMEPRAZOLE 20 MG PO CPDR: 20.0000 mg | DELAYED_RELEASE_CAPSULE | Freq: Every day | ORAL | Status: DC

## 2013-12-22 ENCOUNTER — Other Ambulatory Visit: Payer: Self-pay | Admitting: Internal Medicine

## 2014-02-09 ENCOUNTER — Ambulatory Visit: Payer: Self-pay | Attending: Internal Medicine

## 2014-02-09 MED ORDER — SULFAMETHOXAZOLE-TMP DS 800-160 MG PO TABS: 1.0000 | ORAL_TABLET | Freq: Two times a day (BID) | ORAL | Status: DC

## 2014-02-09 MED ORDER — IBUPROFEN 800 MG PO TABS: 800.0000 mg | ORAL_TABLET | Freq: Three times a day (TID) | ORAL | Status: DC | PRN

## 2014-02-09 NOTE — Progress Notes (Unsigned)
Pt comes in with paronychia to right hand thumb with shooting pain to arm x 2 weeks Pt states the pain is a burning sensation unrelieved by OTC tylenol,ice or wrap. Swelling noted in hand and thumb With touch, shooting pain noted No injury noted

## 2014-02-09 NOTE — Patient Instructions (Addendum)
Paronychia Paronychia is an inflammatory reaction involving the folds of the skin surrounding the fingernail. This is commonly caused by an infection in the skin around a nail. The most common cause of paronychia is frequent wetting of the hands (as seen with bartenders, food servers, nurses or others who wet their hands). This makes the skin around the fingernail susceptible to infection by bacteria (germs) or fungus. Other predisposing factors are:  Aggressive manicuring.  Nail biting.  Thumb sucking. The most common cause is a staphylococcal (a type of germ) infection, or a fungal (Candida) infection. When caused by a germ, it usually comes on suddenly with redness, swelling, pus and is often painful. It may get under the nail and form an abscess (collection of pus), or form an abscess around the nail. If the nail itself is infected with a fungus, the treatment is usually prolonged and may require oral medicine for up to one year. Your caregiver will determine the length of time treatment is required. The paronychia caused by bacteria (germs) may largely be avoided by not pulling on hangnails or picking at cuticles. When the infection occurs at the tips of the finger it is called felon. When the cause of paronychia is from the herpes simplex virus (HSV) it is called herpetic whitlow. TREATMENT  When an abscess is present treatment is often incision and drainage. This means that the abscess must be cut open so the pus can get out. When this is done, the following home care instructions should be followed. HOME CARE INSTRUCTIONS   It is important to keep the affected fingers very dry. Rubber or plastic gloves over cotton gloves should be used whenever the hand must be placed in water.  Keep wound clean, dry and dressed as suggested by your caregiver between warm soaks or warm compresses.  Soak in warm water for fifteen to twenty minutes three to four times per day for bacterial infections. Fungal  infections are very difficult to treat, so often require treatment for long periods of time.  For bacterial (germ) infections take antibiotics (medicine which kill germs) as directed and finish the prescription, even if the problem appears to be solved before the medicine is gone.  Only take over-the-counter or prescription medicines for pain, discomfort, or fever as directed by your caregiver. SEEK IMMEDIATE MEDICAL CARE IF:  You have redness, swelling, or increasing pain in the wound.  You notice pus coming from the wound.  You have a fever.  You notice a bad smell coming from the wound or dressing. Document Released: 02/17/2001 Document Revised: 11/16/2011 Document Reviewed: 10/19/2008 [facility] Patient Information 2014 [place], [place].     Take prescribed antibiotics bid x 7 dys with pain medication Ibuprofen Continue warm compresses Return to clinic if no improvement 3-4 dys

## 2014-03-13 ENCOUNTER — Other Ambulatory Visit: Payer: Self-pay | Admitting: Internal Medicine

## 2014-04-06 ENCOUNTER — Telehealth: Payer: Self-pay | Admitting: Internal Medicine

## 2014-04-06 NOTE — Telephone Encounter (Signed)
Pt called in today to request an appointment with doctor to include medication refill; pt was misinformed that she had an appointment today at 5pm; pt was scheduled an appointment for April 16, 2014 4:30pm;

## 2014-04-16 ENCOUNTER — Ambulatory Visit: Payer: Self-pay | Admitting: Internal Medicine

## 2014-04-23 ENCOUNTER — Other Ambulatory Visit: Payer: Self-pay | Admitting: Internal Medicine

## 2014-04-24 NOTE — Telephone Encounter (Signed)
Pt was only needing another appointment. I transferred her to our schedulers.

## 2014-04-26 ENCOUNTER — Other Ambulatory Visit: Payer: Self-pay | Admitting: Internal Medicine

## 2014-05-03 ENCOUNTER — Encounter: Payer: Self-pay | Admitting: Internal Medicine

## 2014-05-03 ENCOUNTER — Ambulatory Visit: Payer: Self-pay | Attending: Internal Medicine | Admitting: Internal Medicine

## 2014-05-03 VITALS — BP 134/90 | HR 64 | Temp 99.0°F | Resp 16 | Wt 172.4 lb

## 2014-05-03 DIAGNOSIS — G8929 Other chronic pain: Secondary | ICD-10-CM | POA: Insufficient documentation

## 2014-05-03 DIAGNOSIS — M549 Dorsalgia, unspecified: Secondary | ICD-10-CM | POA: Insufficient documentation

## 2014-05-03 DIAGNOSIS — F4310 Post-traumatic stress disorder, unspecified: Secondary | ICD-10-CM | POA: Insufficient documentation

## 2014-05-03 DIAGNOSIS — I10 Essential (primary) hypertension: Secondary | ICD-10-CM | POA: Insufficient documentation

## 2014-05-03 DIAGNOSIS — E785 Hyperlipidemia, unspecified: Secondary | ICD-10-CM | POA: Insufficient documentation

## 2014-05-03 DIAGNOSIS — G589 Mononeuropathy, unspecified: Secondary | ICD-10-CM | POA: Insufficient documentation

## 2014-05-03 DIAGNOSIS — F332 Major depressive disorder, recurrent severe without psychotic features: Secondary | ICD-10-CM | POA: Insufficient documentation

## 2014-05-03 DIAGNOSIS — G4700 Insomnia, unspecified: Secondary | ICD-10-CM | POA: Insufficient documentation

## 2014-05-03 DIAGNOSIS — F411 Generalized anxiety disorder: Secondary | ICD-10-CM | POA: Insufficient documentation

## 2014-05-03 DIAGNOSIS — F333 Major depressive disorder, recurrent, severe with psychotic symptoms: Secondary | ICD-10-CM

## 2014-05-03 LAB — COMPLETE METABOLIC PANEL WITH GFR
ALBUMIN: 4.3 g/dL (ref 3.5–5.2)
ALT: 24 U/L (ref 0–35)
AST: 21 U/L (ref 0–37)
Alkaline Phosphatase: 64 U/L (ref 39–117)
BUN: 9 mg/dL (ref 6–23)
CHLORIDE: 102 meq/L (ref 96–112)
CO2: 26 meq/L (ref 19–32)
CREATININE: 1 mg/dL (ref 0.50–1.10)
Calcium: 9.2 mg/dL (ref 8.4–10.5)
GFR, EST AFRICAN AMERICAN: 80 mL/min
GFR, Est Non African American: 69 mL/min
Glucose, Bld: 132 mg/dL — ABNORMAL HIGH (ref 70–99)
Potassium: 4.6 mEq/L (ref 3.5–5.3)
Sodium: 135 mEq/L (ref 135–145)
TOTAL PROTEIN: 7.3 g/dL (ref 6.0–8.3)
Total Bilirubin: 0.5 mg/dL (ref 0.2–1.2)

## 2014-05-03 LAB — LIPID PANEL
Cholesterol: 182 mg/dL (ref 0–200)
HDL: 50 mg/dL (ref >39)
LDL CALC: 110 mg/dL — AB (ref 0–99)
TRIGLYCERIDES: 112 mg/dL (ref <150)
Total CHOL/HDL Ratio: 3.6 Ratio
VLDL: 22 mg/dL (ref 0–40)

## 2014-05-03 MED ORDER — ALPRAZOLAM 1 MG PO TABS: 1.0000 mg | ORAL_TABLET | Freq: Two times a day (BID) | ORAL | Status: DC | PRN

## 2014-05-03 MED ORDER — ATORVASTATIN CALCIUM 10 MG PO TABS: ORAL_TABLET | ORAL | Status: DC

## 2014-05-03 MED ORDER — LISINOPRIL-HYDROCHLOROTHIAZIDE 20-12.5 MG PO TABS: ORAL_TABLET | ORAL | Status: DC

## 2014-05-03 MED ORDER — TRAZODONE HCL 50 MG PO TABS: 50.0000 mg | ORAL_TABLET | Freq: Every day | ORAL | Status: DC

## 2014-05-03 MED ORDER — GABAPENTIN 300 MG PO CAPS: 300.0000 mg | ORAL_CAPSULE | Freq: Every day | ORAL | Status: DC

## 2014-05-03 MED ORDER — SERTRALINE HCL 50 MG PO TABS: 50.0000 mg | ORAL_TABLET | Freq: Every day | ORAL | Status: DC

## 2014-05-03 NOTE — Progress Notes (Signed)
MRN: [id number] Name: Sabrina Ortiz  Sex: female Age: 43 y.o. DOB: [birth year]  Allergies: Review of patient's allergies indicates no known allergies.  Chief Complaint  Patient presents with  . Medication Refill    HPI: Patient is 43 y.o. female who has history of hypertension, anxiety, insomnia, depression, neuropathy, hyperlipidemia patient is requesting refill on her medications she denies any acute symptoms, as per patient she used to follow with [facility], I have advised patient to have regular followup with the therapist, she denies smoking cigarettes denies drinking alcohol. Patient is up-to-date with her mammogram and Pap smear.  Past Medical History  Diagnosis Date  . Mental disorder   . Depression   . Hypertension   . Anxiety   . PTSD (post-traumatic stress disorder)   . Chronic back pain     No past surgical history on file.    Medication List       This list is accurate as of: 05/03/14 12:16 PM.  Always use your most recent med list.               ALPRAZolam 1 MG tablet  Commonly known as:  XANAX  Take 1 tablet (1 mg total) by mouth 2 (two) times daily as needed for sleep or anxiety.     atorvastatin 10 MG tablet  Commonly known as:  LIPITOR  TAKE 1 TABLET BY MOUTH DAILY.     gabapentin 300 MG capsule  Commonly known as:  NEURONTIN  Take 1 capsule (300 mg total) by mouth at bedtime. For anxiety/discomfort     ibuprofen 800 MG tablet  Commonly known as:  ADVIL,MOTRIN  Take 1 tablet (800 mg total) by mouth every 8 (eight) hours as needed.     levocetirizine 5 MG tablet  Commonly known as:  XYZAL  Take 1 tablet (5 mg total) by mouth every evening.     lisinopril-hydrochlorothiazide 20-12.5 MG per tablet  Commonly known as:  PRINZIDE,ZESTORETIC  TAKE 1 TABLET BY MOUTH DAILY.     omeprazole 20 MG capsule  Commonly known as:  PRILOSEC  TAKE 1 CAPSULE BY MOUTH DAILY.     sertraline 50 MG tablet  Commonly known as:  ZOLOFT  Take 1 tablet (50 mg  total) by mouth daily. For depression.     sulfamethoxazole-trimethoprim 800-160 MG per tablet  Commonly known as:  BACTRIM DS  Take 1 tablet by mouth 2 (two) times daily.     traZODone 50 MG tablet  Commonly known as:  DESYREL  Take 1 tablet (50 mg total) by mouth at bedtime.        Meds ordered this encounter  Medications  . traZODone (DESYREL) 50 MG tablet    Sig: Take 1 tablet (50 mg total) by mouth at bedtime.    Dispense:  30 tablet    Refill:  3  . sertraline (ZOLOFT) 50 MG tablet    Sig: Take 1 tablet (50 mg total) by mouth daily. For depression.    Dispense:  30 tablet    Refill:  3  . lisinopril-hydrochlorothiazide (PRINZIDE,ZESTORETIC) 20-12.5 MG per tablet    Sig: TAKE 1 TABLET BY MOUTH DAILY.    Dispense:  30 tablet    Refill:  2  . gabapentin (NEURONTIN) 300 MG capsule    Sig: Take 1 capsule (300 mg total) by mouth at bedtime. For anxiety/discomfort    Dispense:  30 capsule    Refill:  3  . ALPRAZolam (XANAX) 1 MG tablet  Sig: Take 1 tablet (1 mg total) by mouth 2 (two) times daily as needed for sleep or anxiety.    Dispense:  60 tablet    Refill:  0  . atorvastatin (LIPITOR) 10 MG tablet    Sig: TAKE 1 TABLET BY MOUTH DAILY.    Dispense:  30 tablet    Refill:  2     There is no immunization history on file for this patient.  No family history on file.  History  Substance Use Topics  . Smoking status: Never Smoker   . Smokeless tobacco: Never Used  . Alcohol Use: No    Review of Systems   As noted in HPI  Filed Vitals:   05/03/14 1203  BP: 134/90  Pulse: 64  Temp: 99 F (37.2 C)  Resp: 16    Physical Exam  Physical Exam  Constitutional: No distress.  Eyes: EOM are normal. Pupils are equal, round, and reactive to light.  Cardiovascular: Normal rate and regular rhythm.   Pulmonary/Chest: Breath sounds normal. No respiratory distress. She has no wheezes. She has no rales.  Abdominal: Soft. There is no tenderness.    Musculoskeletal: She exhibits no edema.    CBC    Component Value Date/Time   WBC 8.1 05/12/2013 2008   RBC 4.91 05/12/2013 2008   HGB 12.8 05/12/2013 2008   HCT 37.6 05/12/2013 2008   PLT 167 05/12/2013 2008   MCV 76.6* 05/12/2013 2008    CMP     Component Value Date/Time   NA 136 05/12/2013 2008   K 3.3* 05/12/2013 2008   CL 104 05/12/2013 2008   CO2 23 05/12/2013 2008   GLUCOSE 131* 05/12/2013 2008   BUN 7 05/12/2013 2008   CREATININE 0.93 05/12/2013 2008   CALCIUM 8.7 05/12/2013 2008   PROT 7.4 03/07/2012 2013   ALBUMIN 4.0 03/07/2012 2013   AST 26 03/07/2012 2013   ALT 24 03/07/2012 2013   ALKPHOS 55 03/07/2012 2013   BILITOT 0.2* 03/07/2012 2013   GFRNONAA 75* 05/12/2013 2008   GFRAA 87* 05/12/2013 2008    Lab Results  Component Value Date/Time   CHOL 202* 06/15/2013 11:44 AM    No components found with this basename: hga1c    Lab Results  Component Value Date/Time   AST 26 03/07/2012  8:13 PM    Assessment and Plan  Unspecified essential hypertension - Plan: Continue with her lisinopril-hydrochlorothiazide (PRINZIDE,ZESTORETIC) 20-12.5 MG per tablet, also advise patient for DASH diet, will check blood chemistry COMPLETE METABOLIC PANEL WITH GFR  Dyslipidemia - Plan:  Currently patient is on atorvastatin (LIPITOR) 10 MG tablet, will repeat Lipid panel  Generalized anxiety disorder - Plan: sertraline (ZOLOFT) 50 MG tablet, ALPRAZolam (XANAX) 1 MG tablet when necessary  Major depressive disorder, recurrent, severe with psychotic features - Plan: sertraline (ZOLOFT) 50 MG tablet, also advise patient to follow with [facility]  Insomnia - Plan: traZODone (DESYREL) 50 MG tablet   Health Maintenance  -Pap Smear: uptodate  -Mammogram:uptodate    Return in about 3 months (around 08/03/2014) for hypertension, hyperipidemia.  Doris Cheadle, MD

## 2014-05-03 NOTE — Progress Notes (Signed)
Patient here for medication refill.

## 2014-05-07 ENCOUNTER — Telehealth: Payer: Self-pay

## 2014-05-07 NOTE — Telephone Encounter (Signed)
Patient is aware of her lab results 

## 2014-05-07 NOTE — Telephone Encounter (Signed)
Message copied by Lestine Mount on Mon May 07, 2014  9:22 AM ------      Message from: Doris Cheadle      Created: Fri May 04, 2014  1:14 PM       Blood work reviewed noticed impaired fasting glucose, call and advise patient for low carbohydrate diet.       ------

## 2014-06-04 ENCOUNTER — Ambulatory Visit: Payer: Self-pay

## 2014-07-09 ENCOUNTER — Other Ambulatory Visit: Payer: Self-pay | Admitting: Internal Medicine

## 2014-07-09 ENCOUNTER — Encounter: Payer: Self-pay | Admitting: Internal Medicine

## 2014-07-24 ENCOUNTER — Telehealth: Payer: Self-pay | Admitting: Emergency Medicine

## 2014-08-16 ENCOUNTER — Other Ambulatory Visit: Payer: Self-pay | Admitting: Internal Medicine

## 2014-10-19 ENCOUNTER — Other Ambulatory Visit: Payer: Self-pay | Admitting: Internal Medicine

## 2014-11-02 ENCOUNTER — Emergency Department (INDEPENDENT_AMBULATORY_CARE_PROVIDER_SITE_OTHER)
Admission: EM | Admit: 2014-11-02 | Discharge: 2014-11-02 | Disposition: A | Discharge status: Home / Self Care | Payer: Self-pay | Source: Home / Self Care | Attending: Emergency Medicine | Admitting: Emergency Medicine

## 2014-11-02 ENCOUNTER — Telehealth: Payer: Self-pay | Admitting: Internal Medicine

## 2014-11-02 ENCOUNTER — Encounter (HOSPITAL_COMMUNITY): Payer: Self-pay | Admitting: Emergency Medicine

## 2014-11-02 DIAGNOSIS — R509 Fever, unspecified: Secondary | ICD-10-CM

## 2014-11-02 DIAGNOSIS — J029 Acute pharyngitis, unspecified: Secondary | ICD-10-CM

## 2014-11-02 DIAGNOSIS — J988 Other specified respiratory disorders: Secondary | ICD-10-CM

## 2014-11-02 LAB — POCT RAPID STREP A: Streptococcus, Group A Screen (Direct): NEGATIVE

## 2014-11-02 MED ORDER — AMOXICILLIN 500 MG PO CAPS: 500.0000 mg | ORAL_CAPSULE | Freq: Two times a day (BID) | ORAL | Status: AC

## 2014-11-02 MED ORDER — HYDROCODONE-HOMATROPINE 5-1.5 MG/5ML PO SYRP: 5.0000 mL | ORAL_SOLUTION | Freq: Four times a day (QID) | ORAL | Status: DC | PRN

## 2014-11-02 NOTE — ED Provider Notes (Signed)
CSN: [id number]     Arrival date & time 11/02/14  1845 History   First MD Initiated Contact with Patient 11/02/14 1919     Chief Complaint  Patient presents with  . URI   (Consider location/radiation/quality/duration/timing/severity/associated sxs/prior Treatment) HPI Comments: Sabrina Ortiz presents with a 3 day history of sore throat, painful cervical nodes, congestion, cough (non-productive), chills, subjective fever and overall malaise. Headache with nasal congestion.   Patient is a 44 y.o. female presenting with URI. The history is provided by the patient.  URI Presenting symptoms: congestion, cough, fatigue, fever and sore throat   Associated symptoms: headaches   Associated symptoms: no wheezing     Past Medical History  Diagnosis Date  . Mental disorder   . Depression   . Hypertension   . Anxiety   . PTSD (post-traumatic stress disorder)   . Chronic back pain    History reviewed. No pertinent past surgical history. No family history on file. History  Substance Use Topics  . Smoking status: Never Smoker   . Smokeless tobacco: Never Used  . Alcohol Use: No   OB History    Gravida Para Term Preterm AB TAB SAB Ectopic Multiple Living   3 1   2 2    1      Review of Systems  Constitutional: Positive for fever and fatigue. Negative for chills.  HENT: Positive for congestion, sinus pressure and sore throat.   Eyes: Negative.   Respiratory: Positive for cough. Negative for shortness of breath and wheezing.   Neurological: Positive for headaches. Negative for dizziness, weakness and numbness.  Psychiatric/Behavioral: Positive for agitation.       Noted anxiety and without xanax today    Allergies  Review of patient's allergies indicates no known allergies.  Home Medications   Prior to Admission medications   Medication Sig Start Date End Date Taking? Authorizing Provider  lisinopril-hydrochlorothiazide (PRINZIDE,ZESTORETIC) 20-12.5 MG per tablet TAKE 1 TABLET BY  MOUTH DAILY. 07/24/14  Yes Deepak Advani, MD  pantoprazole (PROTONIX) 40 MG tablet Take 40 mg by mouth daily.   Yes Historical Provider, MD  pravastatin (PRAVACHOL) 40 MG tablet Take 40 mg by mouth daily.   Yes Historical Provider, MD  ALPRAZolam Prudy Feeler(XANAX) 1 MG tablet Take 1 tablet (1 mg total) by mouth 2 (two) times daily as needed for sleep or anxiety. 05/03/14   Doris Cheadleeepak Advani, MD  amoxicillin (AMOXIL) 500 MG capsule Take 1 capsule (500 mg total) by mouth 2 (two) times daily. 11/02/14 11/13/14  Riki SheerMichelle G Ronalda Walpole, PA-C  atorvastatin (LIPITOR) 10 MG tablet TAKE 1 TABLET BY MOUTH DAILY. 10/19/14   Doris Cheadleeepak Advani, MD  gabapentin (NEURONTIN) 300 MG capsule Take 1 capsule (300 mg total) by mouth at bedtime. For anxiety/discomfort 05/03/14   Doris Cheadleeepak Advani, MD  HYDROcodone-homatropine (HYCODAN) 5-1.5 MG/5ML syrup Take 5 mLs by mouth every 6 (six) hours as needed for cough. 11/02/14   Riki SheerMichelle G Kyndra Condron, PA-C  ibuprofen (ADVIL,MOTRIN) 800 MG tablet Take 1 tablet (800 mg total) by mouth every 8 (eight) hours as needed. 02/09/14   Quentin Angstlugbemiga E Jegede, MD  levocetirizine (XYZAL) 5 MG tablet Take 1 tablet (5 mg total) by mouth every evening. 06/15/13   Alison MurrayAlma M Devine, MD  omeprazole (PRILOSEC) 20 MG capsule TAKE 1 CAPSULE BY MOUTH DAILY. 10/19/14   Quentin Angstlugbemiga E Jegede, MD  sertraline (ZOLOFT) 50 MG tablet Take 1 tablet (50 mg total) by mouth daily. For depression. 05/03/14   Doris Cheadleeepak Advani, MD  sulfamethoxazole-trimethoprim (BACTRIM DS) 800-160  MG per tablet Take 1 tablet by mouth 2 (two) times daily. 02/09/14   Quentin Angst, MD  traZODone (DESYREL) 50 MG tablet Take 1 tablet (50 mg total) by mouth at bedtime. 05/03/14   Doris Cheadle, MD   BP 114/78 mmHg  Pulse 133  Temp(Src) 99.9 F (37.7 C) (Oral)  Resp 16  SpO2 96% Physical Exam  Constitutional: She is oriented to person, place, and time. She appears well-developed and well-nourished. No distress.  HENT:  Head: Normocephalic and atraumatic.  Right Ear: External  ear normal.  Left Ear: External ear normal.  Mouth/Throat: Oropharyngeal exudate present.  Injection to oropharynx with exudate   Neck: Normal range of motion. Neck supple.  Mild tonsilar adenopathy  Cardiovascular:  Tacy 130 on presentation rechecked manual during exam at 90  Pulmonary/Chest: Effort normal and breath sounds normal. She has no wheezes. She has no rales.  Musculoskeletal: Normal range of motion.  Neurological: She is alert and oriented to person, place, and time. No cranial nerve deficit.  Skin: Skin is warm and dry. She is not diaphoretic.  Psychiatric: Her behavior is normal.  Nursing note and vitals reviewed.   ED Course  Procedures (including critical care time) Labs Review Labs Reviewed  POCT RAPID STREP A ([facility] CARE ONLY)    Imaging Review No results found.   MDM   1. Respiratory infection   2. Pharyngitis   3. Other specified fever    Cover with abx for exudative oropharnxy suspect strep +/- viral over lap. Treat empirically with Amoxicillin and Hycodan for cough. Mucinex for congestion. If worsens please f/u. Heart rate normalized by end of exam. Suggest she take regular dose of Xanax for her chronic anxiety.   Riki Sheer, PA-C 11/02/14 2024

## 2014-11-02 NOTE — Telephone Encounter (Signed)
Pt is having headaches, body aches, cold and hot, sick to stomach, coughing and cold in chest and runny nose, and no strength. Pain started Tuesday but woke up Wednesday with pain and symptoms. Please follow up with pt. Pt took musinex for two days but has not worked at all.

## 2014-11-02 NOTE — Discharge Instructions (Signed)
Upper Respiratory Infection, Adult An upper respiratory infection (URI) is also known as the common cold. It is often caused by a type of germ (virus). Colds are easily spread (contagious). You can pass it to others by kissing, coughing, sneezing, or drinking out of the same glass. Usually, you get better in 1 or 2 weeks.  HOME CARE   Only take medicine as told by your doctor.  Use a warm mist humidifier or breathe in steam from a hot shower.  Drink enough water and fluids to keep your pee (urine) clear or pale yellow.  Get plenty of rest.  Return to work when your temperature is back to normal or as told by your doctor. You may use a face mask and wash your hands to stop your cold from spreading. GET HELP RIGHT AWAY IF:   After the first few days, you feel you are getting worse.  You have questions about your medicine.  You have chills, shortness of breath, or brown or red spit (mucus).  You have yellow or brown snot (nasal discharge) or pain in the face, especially when you bend forward.  You have a fever, puffy (swollen) neck, pain when you swallow, or white spots in the back of your throat.  You have a bad headache, ear pain, sinus pain, or chest pain.  You have a high-pitched whistling sound when you breathe in and out (wheezing).  You have a lasting cough or cough up blood.  You have sore muscles or a stiff neck. MAKE SURE YOU:   Understand these instructions.  Will watch your condition.  Will get help right away if you are not doing well or get worse. Document Released: 02/10/2008 Document Revised: 11/16/2011 Document Reviewed: 11/29/2013 [facility] Patient Information 2015 [place], [place]. This information is not intended to replace advice given to you by your health care provider. Make sure you discuss any questions you have with your health care provider.    Add Mucinex Max strength to regimena and Aleve only 1 as needed twice a day. Use the RX cough medication as  needed. Will make you sleepy so do not drive with this.

## 2014-11-02 NOTE — Telephone Encounter (Signed)
Pt stated been having flu like Sx since Tuesday. [facility]

## 2014-11-02 NOTE — ED Notes (Signed)
C/o cold sx onset Tuesday night  Sx include HA, BA, congestion, runny nose, fevers, chills Taking OTC cold meds w/no relief Alert, no signs of acute distress; crying due to pain

## 2014-11-05 LAB — CULTURE, GROUP A STREP: STREP A CULTURE: NEGATIVE

## 2014-11-21 ENCOUNTER — Telehealth: Payer: Self-pay

## 2014-11-21 ENCOUNTER — Encounter: Payer: Self-pay | Admitting: Internal Medicine

## 2014-11-21 ENCOUNTER — Ambulatory Visit (HOSPITAL_COMMUNITY)
Admission: RE | Admit: 2014-11-21 | Discharge: 2014-11-21 | Disposition: A | Discharge status: Home / Self Care | Payer: Self-pay | Source: Ambulatory Visit | Attending: Internal Medicine | Admitting: Internal Medicine

## 2014-11-21 ENCOUNTER — Other Ambulatory Visit: Payer: Self-pay

## 2014-11-21 ENCOUNTER — Ambulatory Visit (HOSPITAL_COMMUNITY)
Admission: RE | Admit: 2014-11-21 | Discharge: 2014-11-21 | Disposition: A | Discharge status: Home / Self Care | Payer: Self-pay | Source: Ambulatory Visit | Attending: Cardiology | Admitting: Cardiology

## 2014-11-21 ENCOUNTER — Ambulatory Visit: Payer: Self-pay | Attending: Internal Medicine | Admitting: Internal Medicine

## 2014-11-21 VITALS — BP 115/78 | HR 92 | Temp 98.5°F | Resp 16 | Wt 172.4 lb

## 2014-11-21 DIAGNOSIS — M25561 Pain in right knee: Secondary | ICD-10-CM | POA: Insufficient documentation

## 2014-11-21 DIAGNOSIS — I4901 Ventricular fibrillation: Secondary | ICD-10-CM

## 2014-11-21 DIAGNOSIS — L299 Pruritus, unspecified: Secondary | ICD-10-CM | POA: Insufficient documentation

## 2014-11-21 DIAGNOSIS — Z1231 Encounter for screening mammogram for malignant neoplasm of breast: Secondary | ICD-10-CM

## 2014-11-21 DIAGNOSIS — F418 Other specified anxiety disorders: Secondary | ICD-10-CM

## 2014-11-21 DIAGNOSIS — I4892 Unspecified atrial flutter: Secondary | ICD-10-CM | POA: Insufficient documentation

## 2014-11-21 DIAGNOSIS — I10 Essential (primary) hypertension: Secondary | ICD-10-CM | POA: Insufficient documentation

## 2014-11-21 DIAGNOSIS — F329 Major depressive disorder, single episode, unspecified: Secondary | ICD-10-CM | POA: Insufficient documentation

## 2014-11-21 DIAGNOSIS — M25562 Pain in left knee: Secondary | ICD-10-CM | POA: Insufficient documentation

## 2014-11-21 DIAGNOSIS — F419 Anxiety disorder, unspecified: Secondary | ICD-10-CM | POA: Insufficient documentation

## 2014-11-21 DIAGNOSIS — I498 Other specified cardiac arrhythmias: Secondary | ICD-10-CM | POA: Insufficient documentation

## 2014-11-21 DIAGNOSIS — E785 Hyperlipidemia, unspecified: Secondary | ICD-10-CM | POA: Insufficient documentation

## 2014-11-21 DIAGNOSIS — W19XXXA Unspecified fall, initial encounter: Secondary | ICD-10-CM | POA: Insufficient documentation

## 2014-11-21 DIAGNOSIS — R21 Rash and other nonspecific skin eruption: Secondary | ICD-10-CM | POA: Insufficient documentation

## 2014-11-21 LAB — COMPLETE METABOLIC PANEL WITH GFR
ALT: 16 U/L (ref 0–35)
AST: 14 U/L (ref 0–37)
Albumin: 3.8 g/dL (ref 3.5–5.2)
Alkaline Phosphatase: 57 U/L (ref 39–117)
BILIRUBIN TOTAL: 0.4 mg/dL (ref 0.2–1.2)
BUN: 14 mg/dL (ref 6–23)
CHLORIDE: 105 meq/L (ref 96–112)
CO2: 25 mEq/L (ref 19–32)
CREATININE: 0.94 mg/dL (ref 0.50–1.10)
Calcium: 8.8 mg/dL (ref 8.4–10.5)
GFR, EST AFRICAN AMERICAN: 86 mL/min
GFR, Est Non African American: 75 mL/min
Glucose, Bld: 152 mg/dL — ABNORMAL HIGH (ref 70–99)
Potassium: 4.1 mEq/L (ref 3.5–5.3)
Sodium: 139 mEq/L (ref 135–145)
TOTAL PROTEIN: 6.4 g/dL (ref 6.0–8.3)

## 2014-11-21 LAB — TSH: TSH: 1.115 u[IU]/mL (ref 0.350–4.500)

## 2014-11-21 MED ORDER — CLOTRIMAZOLE-BETAMETHASONE 1-0.05 % EX CREA
1.0000 | TOPICAL_CREAM | Freq: Two times a day (BID) | CUTANEOUS | Status: DC
Start: 2014-11-21 — End: 2015-01-28

## 2014-11-21 MED ORDER — CETIRIZINE HCL 10 MG PO TABS: 10.0000 mg | ORAL_TABLET | Freq: Every day | ORAL | Status: DC

## 2014-11-21 MED ORDER — METHYLPREDNISOLONE 4 MG PO KIT: PACK | ORAL | Status: DC

## 2014-11-21 NOTE — Progress Notes (Signed)
Patient here for follow up Complains of rash under her breasts on her abd and arms Larey SeatFell about a month ago and complaining of pain to bilateral knees Patient is having some fluttering in her chest for about a month

## 2014-11-21 NOTE — Telephone Encounter (Signed)
Patient is aware of her x ray results 

## 2014-11-21 NOTE — Progress Notes (Signed)
MRN: [id number] Name: Sabrina Ortiz  Sex: female Age: 44 y.o. DOB: 11-09-1970  Allergies: Review of patient's allergies indicates no known allergies.  Chief Complaint  Patient presents with  . Rash    HPI: Patient is 44 y.o. female who has history of hypertension, depression/anxiety/insomnia, hyperlipidemia, comes today complaining of noticing rash on her abdomen arms for the last 3-4 weeks, denies any fever chills chest pain or shortness of breath, does complain of occasional palpitation at bedtime she also feels hot, denies any exertional chest pain or shortness of breath, denies smoking cigarettes has been compliant in taking her medications her blood pressure is well controlled, EKG done today shows normal sinus rhythm with nonspecific ST-T changes, patient has also lost to follow with  psychiatrist and is in the process to get insurance and then she'll make an appointment currently denies any SI or HI.Patient also reported to have fallen a few months ago and complaining of bilateral knee pain.  Past Medical History  Diagnosis Date  . Mental disorder   . Depression   . Hypertension   . Anxiety   . PTSD (post-traumatic stress disorder)   . Chronic back pain     History reviewed. No pertinent past surgical history.    Medication List       This list is accurate as of: 11/21/14  5:32 PM.  Always use your most recent med list.               ALPRAZolam 1 MG tablet  Commonly known as:  XANAX  Take 1 tablet (1 mg total) by mouth 2 (two) times daily as needed for sleep or anxiety.     atorvastatin 10 MG tablet  Commonly known as:  LIPITOR  TAKE 1 TABLET BY MOUTH DAILY.     cetirizine 10 MG tablet  Commonly known as:  ZYRTEC  Take 1 tablet (10 mg total) by mouth daily.     clotrimazole-betamethasone cream  Commonly known as:  LOTRISONE  Apply 1 application topically 2 (two) times daily.     gabapentin 300 MG capsule  Commonly known as:  NEURONTIN  Take 1 capsule  (300 mg total) by mouth at bedtime. For anxiety/discomfort     HYDROcodone-homatropine 5-1.5 MG/5ML syrup  Commonly known as:  HYCODAN  Take 5 mLs by mouth every 6 (six) hours as needed for cough.     ibuprofen 800 MG tablet  Commonly known as:  ADVIL,MOTRIN  Take 1 tablet (800 mg total) by mouth every 8 (eight) hours as needed.     levocetirizine 5 MG tablet  Commonly known as:  XYZAL  Take 1 tablet (5 mg total) by mouth every evening.     lisinopril-hydrochlorothiazide 20-12.5 MG per tablet  Commonly known as:  PRINZIDE,ZESTORETIC  TAKE 1 TABLET BY MOUTH DAILY.     methylPREDNISolone 4 MG tablet  Commonly known as:  MEDROL DOSEPAK  follow package directions     omeprazole 20 MG capsule  Commonly known as:  PRILOSEC  TAKE 1 CAPSULE BY MOUTH DAILY.     pantoprazole 40 MG tablet  Commonly known as:  PROTONIX  Take 40 mg by mouth daily.     pravastatin 40 MG tablet  Commonly known as:  PRAVACHOL  Take 40 mg by mouth daily.     sertraline 50 MG tablet  Commonly known as:  ZOLOFT  Take 1 tablet (50 mg total) by mouth daily. For depression.     sulfamethoxazole-trimethoprim 800-160 MG  per tablet  Commonly known as:  BACTRIM DS  Take 1 tablet by mouth 2 (two) times daily.     traZODone 50 MG tablet  Commonly known as:  DESYREL  Take 1 tablet (50 mg total) by mouth at bedtime.        Meds ordered this encounter  Medications  . methylPREDNISolone (MEDROL DOSEPAK) 4 MG tablet    Sig: follow package directions    Dispense:  21 tablet    Refill:  0  . cetirizine (ZYRTEC) 10 MG tablet    Sig: Take 1 tablet (10 mg total) by mouth daily.    Dispense:  30 tablet    Refill:  3  . clotrimazole-betamethasone (LOTRISONE) cream    Sig: Apply 1 application topically 2 (two) times daily.    Dispense:  30 g    Refill:  1     There is no immunization history on file for this patient.  History reviewed. No pertinent family history.  History  Substance Use Topics  .  Smoking status: Never Smoker   . Smokeless tobacco: Never Used  . Alcohol Use: No    Review of Systems   As noted in HPI  Filed Vitals:   11/21/14 1432  BP: 115/78  Pulse: 92  Temp: 98.5 F (36.9 C)  Resp: 16    Physical Exam  Physical Exam  Constitutional: No distress.  Eyes: EOM are normal. Pupils are equal, round, and reactive to light.  Cardiovascular: Normal rate and regular rhythm.   Pulmonary/Chest: Breath sounds normal. No respiratory distress. She has no wheezes. She has no rales.  Musculoskeletal:  Rash noticed on the arms and lower abdomen.    CBC    Component Value Date/Time   WBC 8.1 05/12/2013 2008   RBC 4.91 05/12/2013 2008   HGB 12.8 05/12/2013 2008   HCT 37.6 05/12/2013 2008   PLT 167 05/12/2013 2008   MCV 76.6* 05/12/2013 2008    CMP     Component Value Date/Time   NA 135 05/03/2014 1220   K 4.6 05/03/2014 1220   CL 102 05/03/2014 1220   CO2 26 05/03/2014 1220   GLUCOSE 132* 05/03/2014 1220   BUN 9 05/03/2014 1220   CREATININE 1.00 05/03/2014 1220   CREATININE 0.93 05/12/2013 2008   CALCIUM 9.2 05/03/2014 1220   PROT 7.3 05/03/2014 1220   ALBUMIN 4.3 05/03/2014 1220   AST 21 05/03/2014 1220   ALT 24 05/03/2014 1220   ALKPHOS 64 05/03/2014 1220   BILITOT 0.5 05/03/2014 1220   GFRNONAA 69 05/03/2014 1220   GFRNONAA 75* 05/12/2013 2008   GFRAA 80 05/03/2014 1220   GFRAA 87* 05/12/2013 2008    Lab Results  Component Value Date/Time   CHOL 182 05/03/2014 12:20 PM    No components found for: HGA1C  Lab Results  Component Value Date/Time   AST 21 05/03/2014 12:20 PM    Assessment and Plan  Fluttering heart - Plan: EKG 12-Lead, shows normal sinus rhythm with a rate of 89 beats per minute, will check TSH level  Dyslipidemia Patient is currently on Lipitor, will check fasting lipid panel.  Essential hypertension - Plan: Blood pressure is well controlled, continue with current meds, repeat blood chemistryCOMPLETE METABOLIC  PANEL WITH GFR  Rash and nonspecific skin eruption - Plan: methylPREDNISolone (MEDROL DOSEPAK) 4 MG tablet, cetirizine (ZYRTEC) 10 MG tablet, clotrimazole-betamethasone (LOTRISONE) cream  Encounter for screening mammogram for breast cancer - Plan: MM DIGITAL SCREENING BILATERAL  Itching Patient will  take Zyrtec  Bilateral knee pain - Plan: DG Knee Bilateral Standing AP, Vit D  25 hydroxy (rtn osteoporosis monitoring)  Anxiety and depression Patient will schedule followup appointment with her psychiatrist.    Return in about 3 months (around 02/21/2015), or if symptoms worsen or fail to improve, for hypertension, hyperipidemia.   This note has been created with Education officer, environmental. Any transcriptional errors are unintentional.    Doris Cheadle, MD

## 2014-11-21 NOTE — Telephone Encounter (Signed)
-----   Message from Doris Cheadleeepak Advani, MD sent at 11/21/2014  5:38 PM EDT ----- Call and let the  patient know that her x-ray of knees is reported to be normal.

## 2014-11-22 ENCOUNTER — Telehealth: Payer: Self-pay

## 2014-11-22 LAB — VITAMIN D 25 HYDROXY (VIT D DEFICIENCY, FRACTURES): VIT D 25 HYDROXY: 15 ng/mL — AB (ref 30–100)

## 2014-11-22 MED ORDER — VITAMIN D (ERGOCALCIFEROL) 1.25 MG (50000 UNIT) PO CAPS: 50000.0000 [IU] | ORAL_CAPSULE | ORAL | Status: DC

## 2014-11-22 NOTE — Telephone Encounter (Signed)
-----   Message from Doris Cheadleeepak Advani, MD sent at 11/22/2014  9:21 AM EDT ----- Blood work reviewed, noticed low vitamin D, call patient advise to start ergocalciferol 50,000 units once a week for the duration of  12 weeks.  Blood work reviewed noticed impaired fasting glucose, call and advise patient for low carbohydrate diet.will repeat blood chemistry as well as check for A1c on the following visit.

## 2014-11-22 NOTE — Telephone Encounter (Signed)
Patient is aware of her lab results 

## 2014-11-27 ENCOUNTER — Encounter: Payer: Self-pay | Admitting: Internal Medicine

## 2014-11-27 ENCOUNTER — Other Ambulatory Visit: Payer: Self-pay | Admitting: Internal Medicine

## 2014-11-28 ENCOUNTER — Ambulatory Visit (HOSPITAL_COMMUNITY)
Admission: RE | Admit: 2014-11-28 | Discharge: 2014-11-28 | Disposition: A | Discharge status: Home / Self Care | Payer: Self-pay | Source: Ambulatory Visit | Attending: Internal Medicine | Admitting: Internal Medicine

## 2014-11-28 DIAGNOSIS — Z1231 Encounter for screening mammogram for malignant neoplasm of breast: Secondary | ICD-10-CM | POA: Insufficient documentation

## 2014-12-18 ENCOUNTER — Ambulatory Visit: Payer: Self-pay | Attending: Internal Medicine

## 2015-01-28 ENCOUNTER — Ambulatory Visit: Payer: Self-pay | Attending: Internal Medicine | Admitting: Family Medicine

## 2015-01-28 ENCOUNTER — Other Ambulatory Visit: Payer: Self-pay | Admitting: *Deleted

## 2015-01-28 VITALS — BP 123/86 | HR 69 | Temp 98.3°F

## 2015-01-28 DIAGNOSIS — N898 Other specified noninflammatory disorders of vagina: Secondary | ICD-10-CM

## 2015-01-28 DIAGNOSIS — F333 Major depressive disorder, recurrent, severe with psychotic symptoms: Secondary | ICD-10-CM

## 2015-01-28 DIAGNOSIS — F411 Generalized anxiety disorder: Secondary | ICD-10-CM

## 2015-01-28 DIAGNOSIS — G4700 Insomnia, unspecified: Secondary | ICD-10-CM

## 2015-01-28 DIAGNOSIS — R21 Rash and other nonspecific skin eruption: Secondary | ICD-10-CM

## 2015-01-28 MED ORDER — PANTOPRAZOLE SODIUM 40 MG PO TBEC: 40.0000 mg | DELAYED_RELEASE_TABLET | Freq: Every day | ORAL | Status: DC

## 2015-01-28 MED ORDER — CLOTRIMAZOLE-BETAMETHASONE 1-0.05 % EX CREA: 1.0000 "application " | TOPICAL_CREAM | Freq: Two times a day (BID) | CUTANEOUS | Status: DC

## 2015-01-28 MED ORDER — TRAZODONE HCL 50 MG PO TABS: 50.0000 mg | ORAL_TABLET | Freq: Every day | ORAL | Status: DC

## 2015-01-28 MED ORDER — IBUPROFEN 800 MG PO TABS: 800.0000 mg | ORAL_TABLET | Freq: Three times a day (TID) | ORAL | Status: DC | PRN

## 2015-01-28 MED ORDER — ATORVASTATIN CALCIUM 10 MG PO TABS: 10.0000 mg | ORAL_TABLET | Freq: Every day | ORAL | Status: DC

## 2015-01-28 MED ORDER — LISINOPRIL-HYDROCHLOROTHIAZIDE 20-12.5 MG PO TABS: 1.0000 | ORAL_TABLET | Freq: Every day | ORAL | Status: DC

## 2015-01-28 MED ORDER — CETIRIZINE HCL 10 MG PO TABS: 10.0000 mg | ORAL_TABLET | Freq: Every day | ORAL | Status: DC

## 2015-01-28 MED ORDER — LEVOCETIRIZINE DIHYDROCHLORIDE 5 MG PO TABS: 5.0000 mg | ORAL_TABLET | Freq: Every evening | ORAL | Status: DC

## 2015-01-28 MED ORDER — OMEPRAZOLE 20 MG PO CPDR: 20.0000 mg | DELAYED_RELEASE_CAPSULE | Freq: Every day | ORAL | Status: DC

## 2015-01-28 MED ORDER — VITAMIN D (ERGOCALCIFEROL) 1.25 MG (50000 UNIT) PO CAPS: 50000.0000 [IU] | ORAL_CAPSULE | ORAL | Status: DC

## 2015-01-28 MED ORDER — GABAPENTIN 300 MG PO CAPS: 300.0000 mg | ORAL_CAPSULE | Freq: Every day | ORAL | Status: DC

## 2015-01-28 MED ORDER — PRAVASTATIN SODIUM 40 MG PO TABS: 40.0000 mg | ORAL_TABLET | Freq: Every day | ORAL | Status: DC

## 2015-01-28 MED ORDER — SERTRALINE HCL 50 MG PO TABS: 50.0000 mg | ORAL_TABLET | Freq: Every day | ORAL | Status: DC

## 2015-01-28 NOTE — Progress Notes (Addendum)
Subjective:     Patient ID: Sabrina Ortiz, female   DOB: 13-Nov-1970, 44 y.o.   MRN: [id number]  HPI   Patient presents today for vaginal irritation and discharge. She has been having symptoms for 4 days. This started after a bubble bath, intercourse where condom came off and a 2nd bubble bath. She has used an Veterinary surgeonTC yeast treatment without relief.   Review of Systems   She denies significant itching,pain with urination, thick discolored discharge or pelvic pain     Objective:   Physical Exam   She is alert, oriented, appropriate Skin is warm and dry There is mild irritation around the vaginal opening with this whitish discharge. BP 137/92 today, 130/90 in ED a few days ago.     Assessment:    1. Vaginal irritation and discharge  2. Elevated BP 137/92     Plan:     1.Wet prep for yeast, BV and Trich. Will treat if needed after those results. I have asked her to return for change in discharge or suprapubic tenderness. I have advised against douching.  2. Recheck at next visit.   Henrietta HooverLinda C. Bernhardt, FNP-BC [facility] [phone]    I

## 2015-01-28 NOTE — Patient Instructions (Signed)
We will call you with results tomorrow or Wednesday.

## 2015-01-29 LAB — WET PREP BY MOLECULAR PROBE
Candida species: NEGATIVE
Gardnerella vaginalis: NEGATIVE
TRICHOMONAS VAG: NEGATIVE

## 2015-03-14 ENCOUNTER — Other Ambulatory Visit: Payer: Self-pay | Admitting: Internal Medicine

## 2015-06-11 ENCOUNTER — Ambulatory Visit: Payer: Self-pay | Attending: Internal Medicine | Admitting: Internal Medicine

## 2015-06-11 ENCOUNTER — Encounter: Payer: Self-pay | Admitting: Internal Medicine

## 2015-06-11 ENCOUNTER — Ambulatory Visit (HOSPITAL_COMMUNITY)
Admission: RE | Admit: 2015-06-11 | Discharge: 2015-06-11 | Disposition: A | Discharge status: Home / Self Care | Payer: Self-pay | Source: Ambulatory Visit | Attending: Internal Medicine | Admitting: Internal Medicine

## 2015-06-11 ENCOUNTER — Other Ambulatory Visit: Payer: Self-pay

## 2015-06-11 VITALS — BP 125/85 | HR 83 | Temp 98.0°F | Resp 16 | Ht 66.0 in | Wt 177.4 lb

## 2015-06-11 DIAGNOSIS — F411 Generalized anxiety disorder: Secondary | ICD-10-CM

## 2015-06-11 DIAGNOSIS — R002 Palpitations: Secondary | ICD-10-CM

## 2015-06-11 DIAGNOSIS — E785 Hyperlipidemia, unspecified: Secondary | ICD-10-CM

## 2015-06-11 DIAGNOSIS — K219 Gastro-esophageal reflux disease without esophagitis: Secondary | ICD-10-CM

## 2015-06-11 DIAGNOSIS — I10 Essential (primary) hypertension: Secondary | ICD-10-CM

## 2015-06-11 DIAGNOSIS — R9431 Abnormal electrocardiogram [ECG] [EKG]: Secondary | ICD-10-CM | POA: Insufficient documentation

## 2015-06-11 MED ORDER — OMEPRAZOLE 20 MG PO CPDR: 20.0000 mg | DELAYED_RELEASE_CAPSULE | Freq: Every day | ORAL | Status: DC

## 2015-06-11 MED ORDER — PRAVASTATIN SODIUM 40 MG PO TABS: 40.0000 mg | ORAL_TABLET | Freq: Every day | ORAL | Status: DC

## 2015-06-11 MED ORDER — LISINOPRIL-HYDROCHLOROTHIAZIDE 20-12.5 MG PO TABS: 1.0000 | ORAL_TABLET | Freq: Every day | ORAL | Status: DC

## 2015-06-11 MED ORDER — ALPRAZOLAM 0.5 MG PO TABS: 0.5000 mg | ORAL_TABLET | Freq: Every evening | ORAL | Status: DC | PRN

## 2015-06-11 NOTE — Progress Notes (Signed)
Patient ID: LESTINE RAHE, female   DOB: [birth year], 44 y.o.   MRN: [id number]  CC: heart palpitations and anxiety  HPI: DEVINNE EPSTEIN is 44 y.o. female who has history of hypertension, depression/anxiety/insomnia, hyperlipidemia. She is present today complaining of increased anxiety and heart palpitations. She states that she has been having more heart palpitations at bedtime and feels like they are keeping her from sleeping at night. She states that she has noticed a increase in the anxiety for the past 2 months. She has been evaluated for the same complaints here in the past and was referred to psychiatry but has yet to make appointment. She notes that she stopped taking her Zoloft and Trazodone a few months ago because she did not have the money to get her medication filled. She notes that she has increased stress and worry all the time. She denies chest pain but some SOB with panic attack. She reports that she stopped going to [facility] because she did not like the service. She notes that she has been on Gabapentin, Wellbutrin, Zoloft, Abilify, and Xanax in the past. She states that Zoloft made her more anxious.    No Known Allergies Past Medical History  Diagnosis Date  . Mental disorder   . Depression   . Hypertension   . Anxiety   . PTSD (post-traumatic stress disorder)   . Chronic back pain    Current Outpatient Prescriptions on File Prior to Visit  Medication Sig Dispense Refill  . ALPRAZolam (XANAX) 1 MG tablet Take 1 tablet (1 mg total) by mouth 2 (two) times daily as needed for sleep or anxiety. 60 tablet 0  . atorvastatin (LIPITOR) 10 MG tablet Take 1 tablet (10 mg total) by mouth daily. 30 tablet 2  . cetirizine (ZYRTEC) 10 MG tablet Take 1 tablet (10 mg total) by mouth daily. 30 tablet 3  . gabapentin (NEURONTIN) 300 MG capsule Take 1 capsule (300 mg total) by mouth at bedtime. For anxiety/discomfort 30 capsule 3  . ibuprofen (ADVIL,MOTRIN) 800 MG tablet Take 1 tablet (800 mg  total) by mouth every 8 (eight) hours as needed. 60 tablet 0  . levocetirizine (XYZAL) 5 MG tablet Take 1 tablet (5 mg total) by mouth every evening. 30 tablet 3  . lisinopril-hydrochlorothiazide (PRINZIDE,ZESTORETIC) 20-12.5 MG per tablet TAKE 1 TABLET BY MOUTH DAILY. 30 tablet 2  . omeprazole (PRILOSEC) 20 MG capsule Take 1 capsule (20 mg total) by mouth daily. 30 capsule 3  . pantoprazole (PROTONIX) 40 MG tablet Take 1 tablet (40 mg total) by mouth daily. 30 tablet 2  . pravastatin (PRAVACHOL) 40 MG tablet Take 1 tablet (40 mg total) by mouth daily. 30 tablet 2  . sertraline (ZOLOFT) 50 MG tablet Take 1 tablet (50 mg total) by mouth daily. For depression. 30 tablet 3  . traZODone (DESYREL) 50 MG tablet Take 1 tablet (50 mg total) by mouth at bedtime. 30 tablet 3  . clotrimazole-betamethasone (LOTRISONE) cream Apply 1 application topically 2 (two) times daily. 30 g 1  . HYDROcodone-homatropine (HYCODAN) 5-1.5 MG/5ML syrup Take 5 mLs by mouth every 6 (six) hours as needed for cough. (Patient not taking: Reported on 01/28/2015) 120 mL 0  . methylPREDNISolone (MEDROL DOSEPAK) 4 MG tablet follow package directions (Patient not taking: Reported on 01/28/2015) 21 tablet 0  . sulfamethoxazole-trimethoprim (BACTRIM DS) 800-160 MG per tablet Take 1 tablet by mouth 2 (two) times daily. (Patient not taking: Reported on 01/28/2015) 14 tablet 0  . Vitamin D, Ergocalciferol, (  DRISDOL) 50000 UNITS CAPS capsule Take 1 capsule (50,000 Units total) by mouth every 7 (seven) days. 12 capsule 0   No current facility-administered medications on file prior to visit.   History reviewed. No pertinent family history. Social History   Social History  . Marital Status: Single    Spouse Name: N/A  . Number of Children: N/A  . Years of Education: N/A   Occupational History  . Not on file.   Social History Main Topics  . Smoking status: Never Smoker   . Smokeless tobacco: Never Used  . Alcohol Use: No  . Drug Use:  No  . Sexual Activity: Not on file   Other Topics Concern  . Not on file   Social History Narrative    Review of Systems: Other than what is stated in HPI, all other systems are negative.   Objective:   Filed Vitals:   06/11/15 1514  BP: 125/85  Pulse: 83  Temp: 98 F (36.7 C)  Resp: 16    Physical Exam  Constitutional: She is oriented to person, place, and time. No distress.  Cardiovascular: Normal rate, regular rhythm and normal heart sounds.   No murmur heard. Pulmonary/Chest: Effort normal and breath sounds normal.  Abdominal: There is no tenderness.  Musculoskeletal: She exhibits no edema.  Neurological: She is alert and oriented to person, place, and time.  Skin: Skin is warm and dry. She is not diaphoretic.     Lab Results  Component Value Date   WBC 8.1 05/12/2013   HGB 12.8 05/12/2013   HCT 37.6 05/12/2013   MCV 76.6* 05/12/2013   PLT 167 05/12/2013   Lab Results  Component Value Date   CREATININE 0.94 11/21/2014   BUN 14 11/21/2014   NA 139 11/21/2014   K 4.1 11/21/2014   CL 105 11/21/2014   CO2 25 11/21/2014    Lab Results  Component Value Date   HGBA1C 6.1 06/15/2013   Lipid Panel     Component Value Date/Time   CHOL 182 05/03/2014 1220   TRIG 112 05/03/2014 1220   HDL 50 05/03/2014 1220   CHOLHDL 3.6 05/03/2014 1220   VLDL 22 05/03/2014 1220   LDLCALC 110* 05/03/2014 1220       Assessment and plan:   Jayne was seen today for anxiety.  Diagnoses and all orders for this visit:  Heart palpitations -     EKG 12-Lead EKG: unchanged from previous tracings, normal sinus rhythm, nonspecific ST and T waves changes, prolonged QT interval.  Generalized anxiety disorder -     ALPRAZolam (XANAX) 0.5 MG tablet; Take 1 tablet (0.5 mg total) by mouth at bedtime as needed for anxiety. -     Vitamin D, 25-hydroxy; Future Discussed with patient that anxiety symptoms are her body's response to stress. Discussed the possible need to initiate  SSRI. Discussed that benzo's are habit forming and should only be used when symptoms are severe and this will not be a cyclic fill drug.  Due to patients concerns and history I have encouraged patient to seek psychiatry services soon. She verbalized understanding and agrees to try [facility] the [place] for care.  Essential hypertension -     Refilled lisinopril-hydrochlorothiazide (PRINZIDE,ZESTORETIC) 20-12.5 MG tablet; Take 1 tablet by mouth daily. -     Basic Metabolic Panel; Future Patient blood pressure is stable and may continue on current medication.  Education on diet, exercise, and modifiable risk factors discussed. Will obtain appropriate labs as needed.  Will follow up in 3-6 months.   Dyslipidemia -     pravastatin (PRAVACHOL) 40 MG tablet; Take 1 tablet (40 mg total) by mouth daily. -     Lipid panel; Future Last lipid panel was 04/2014 which LDL was not at goal. Will recheck in 1 week. Will continue pravastatin for now.  Gastroesophageal reflux disease, esophagitis presence not specified -     omeprazole (PRILOSEC) 20 MG capsule; Take 1 capsule (20 mg total) by mouth daily. Discussed diet and weight with patient relating to acid reflux.  Went over things that may exacerbate acid reflux such as tomatoes, spicy foods, coffee, carbonated beverages, chocolates, etc.  Advised patient to avoid laying down at least two hours after meals and sleep with HOB elevated.   Return in about 6 months (around 12/10/2015) for Hypertension and 1 week lab visit.        Ambrose Finland, NP-C [facility] [phone] 06/11/2015, 3:29 PM

## 2015-06-11 NOTE — Patient Instructions (Signed)
Please make appt with [facility] the [place]

## 2015-06-11 NOTE — Progress Notes (Signed)
Patient here for follow up Patient states she is having a lot of anxiety to the point it is waking her up at night Patient also complains of having a fluttering in her chest which has been feeling more often

## 2015-06-18 ENCOUNTER — Other Ambulatory Visit: Payer: Self-pay

## 2015-07-30 ENCOUNTER — Telehealth: Payer: Self-pay | Admitting: Internal Medicine

## 2015-07-30 NOTE — Telephone Encounter (Signed)
Pt. Called requesting to speak to nurse because she has had her menstrual cycle twice a month for 6 or 7 months. Pt. Would like to know what to do. Please f/u with pt.

## 2015-08-06 ENCOUNTER — Telehealth: Payer: Self-pay

## 2015-08-06 DIAGNOSIS — N926 Irregular menstruation, unspecified: Secondary | ICD-10-CM

## 2015-08-06 NOTE — Telephone Encounter (Signed)
Returned patient phone call Patient states she has been having irregular periods(two cycles a month) For the past six months Informed patient that it could be the onset of early menopause but  Suggested that she be seen by OB-Gyn Referral place in Epic and patient is aware

## 2015-08-15 ENCOUNTER — Encounter: Payer: Self-pay | Admitting: Obstetrics and Gynecology

## 2015-09-11 ENCOUNTER — Encounter: Payer: Self-pay | Admitting: Obstetrics and Gynecology

## 2015-09-11 ENCOUNTER — Other Ambulatory Visit (HOSPITAL_COMMUNITY)
Admission: RE | Admit: 2015-09-11 | Discharge: 2015-09-11 | Disposition: A | Discharge status: Home / Self Care | Payer: Self-pay | Source: Ambulatory Visit | Attending: Obstetrics and Gynecology | Admitting: Obstetrics and Gynecology

## 2015-09-11 ENCOUNTER — Ambulatory Visit (INDEPENDENT_AMBULATORY_CARE_PROVIDER_SITE_OTHER): Payer: Self-pay | Admitting: Obstetrics and Gynecology

## 2015-09-11 VITALS — BP 127/84 | HR 77 | Temp 98.8°F | Ht 64.0 in | Wt 172.0 lb

## 2015-09-11 DIAGNOSIS — Z01812 Encounter for preprocedural laboratory examination: Secondary | ICD-10-CM

## 2015-09-11 DIAGNOSIS — N938 Other specified abnormal uterine and vaginal bleeding: Secondary | ICD-10-CM

## 2015-09-11 LAB — POCT PREGNANCY, URINE: PREG TEST UR: NEGATIVE

## 2015-09-11 MED ORDER — MEGESTROL ACETATE 40 MG PO TABS: 40.0000 mg | ORAL_TABLET | Freq: Two times a day (BID) | ORAL | Status: DC

## 2015-09-11 MED ORDER — IBUPROFEN 800 MG PO TABS
800.0000 mg | ORAL_TABLET | Freq: Once | ORAL | Status: AC
  Administered 2015-09-11: 800 mg via ORAL

## 2015-09-11 MED FILL — MEGESTROL 40 MG TABLET: 40 | 30 days supply | Qty: 60 | Fill #0

## 2015-09-11 NOTE — Progress Notes (Signed)
Patient ID: Sabrina Ortiz, female   DOB: 1970/10/31, 45 y.o.   MRN: [id number] 45 yo [id number] presenting today for the evaluation of abnormal uterine bleeding. Patient reports the onset of 2 periods every month for the past 6-8 months. Patient reports that her cycles lasted for 3-5 days and were not very heavy prior to 8 months ago. She never experienced dysmenorrhea until recently. Her cycles now last 5-6 days each and are heavy with passage of clots.   Past Medical History  Diagnosis Date  . Mental disorder   . Depression   . Hypertension   . Anxiety   . PTSD (post-traumatic stress disorder)   . Chronic back pain    No past surgical history on file. No family history on file. Social History  Substance Use Topics  . Smoking status: Never Smoker   . Smokeless tobacco: Never Used  . Alcohol Use: No   ROS See pertinent in HPI  Blood pressure 127/84, pulse 77, temperature 98.8 F (37.1 C), temperature source Oral, height 5\' 4"  (1.626 m), weight 172 lb (78.019 kg), last menstrual period 08/29/2015.  GENERAL: Well-developed, well-nourished female in no acute distress.  ABDOMEN: Soft, nontender, nondistended. No organomegaly. PELVIC: Normal external female genitalia. Vagina is pink and rugated.  Normal discharge. Normal appearing cervix. Uterus is normal in size. No adnexal mass or tenderness. EXTREMITIES: No cyanosis, clubbing, or edema, 2+ distal pulses.  A/P 45 yo with DUB - Will order pelvic ultrasound to rule out any anatomical cause for her DUB - Discussed the need for endometrial biopsy to rule out malignancy ENDOMETRIAL BIOPSY     The indications for endometrial biopsy were reviewed.   Risks of the biopsy including cramping, bleeding, infection, uterine perforation, inadequate specimen and need for additional procedures  were discussed. The patient states she understands and agrees to undergo procedure today. Consent was signed. Time out was performed. Urine HCG was negative. A  sterile speculum was placed in the patient's vagina and the cervix was prepped with Betadine. A single-toothed tenaculum was placed on the anterior lip of the cervix to stabilize it. The uterine cavity was sounded to a depth of 9 cm using the uterine sound. The 3 mm pipelle was introduced into the endometrial cavity without difficulty, 2 passes were made.  A  moderate amount of tissue was  sent to pathology. The instruments were removed from the patient's vagina. Minimal bleeding from the cervix was noted. The patient tolerated the procedure well.  Routine post-procedure instructions were given to the patient. The patient will follow up in two weeks to review the results and for further management.   - Discussed medical management with Megace for now, pending results of ultrasound and biopsy. If both negative, also discussed medical management with IUD. - Patient will be contacted with results and management plan

## 2015-09-11 NOTE — Addendum Note (Signed)
Addended by: Kathee DeltonHILLMAN, Nickoles Gregori L on: 09/11/2015 02:07 PM   Modules accepted: Orders

## 2015-09-13 ENCOUNTER — Telehealth: Payer: Self-pay

## 2015-09-13 NOTE — Telephone Encounter (Signed)
-----   Message from Catalina AntiguaPeggy Constant, MD sent at 09/13/2015  9:52 AM EST ----- Please inform patient of negative endometrial biopsy. She may continue medical management with megace. I will contact her with the ultrasound results  Thanks  Peggy

## 2015-09-13 NOTE — Telephone Encounter (Signed)
Spoke with patient she was made aware of lab results. She has not started taking megace yet but will follow medical management pain.

## 2015-09-17 ENCOUNTER — Ambulatory Visit (HOSPITAL_COMMUNITY)
Admission: RE | Admit: 2015-09-17 | Discharge: 2015-09-17 | Disposition: A | Discharge status: Home / Self Care | Payer: Self-pay | Source: Ambulatory Visit | Attending: Obstetrics and Gynecology | Admitting: Obstetrics and Gynecology

## 2015-09-17 DIAGNOSIS — N938 Other specified abnormal uterine and vaginal bleeding: Secondary | ICD-10-CM | POA: Insufficient documentation

## 2015-09-18 ENCOUNTER — Telehealth: Payer: Self-pay

## 2015-09-18 NOTE — Telephone Encounter (Signed)
-----   Message from Catalina AntiguaPeggy Constant, MD sent at 09/18/2015  7:15 AM EST ----- Please inform patient of normal ultrasound result. She may continue the previously discussed plan of daily megace and return when ready for further medical management  Thanks  Gigi Gineggy

## 2015-09-18 NOTE — Telephone Encounter (Signed)
Informed patient of normal u/s and to continued using megace. She will follow up if needed.

## 2015-09-30 ENCOUNTER — Telehealth: Payer: Self-pay | Admitting: Internal Medicine

## 2015-09-30 ENCOUNTER — Telehealth: Payer: Self-pay

## 2015-09-30 MED FILL — LISINOPRIL-HCTZ 20-12.5 MG: 20-12.5 | 30 days supply | Qty: 30 | Fill #1

## 2015-09-30 MED FILL — ?OMEPRAZOLE DR 20 MG CAPSUL: 20 | 30 days supply | Qty: 30 | Fill #3

## 2015-09-30 NOTE — Telephone Encounter (Signed)
Patient returned nurse phone call. Please follow

## 2015-09-30 NOTE — Telephone Encounter (Signed)
Pt. Called stating that she would want the IDU due to her irregular periods. Pt stated she had an ultrasound done and was told to take pill or have an IUD. Please f/u with pt.

## 2015-09-30 NOTE — Telephone Encounter (Signed)
Returned patient phone call Patient not available Unable to leave voice mail  Mailbox is full 

## 2015-10-02 ENCOUNTER — Telehealth: Payer: Self-pay

## 2015-10-02 NOTE — Telephone Encounter (Signed)
Returned phone call to patient Patient is wanting to have an IUD placed Explained the process with no insurance Patient will stop in today to pick up the application

## 2015-11-14 MED FILL — LISINOPRIL-HCTZ 20-12.5 MG: 20-12.5 | 30 days supply | Qty: 30 | Fill #2

## 2015-11-14 MED FILL — OMEPRAZOLE DR 20 MG CAPSULE: 20 | 30 days supply | Qty: 30 | Fill #0

## 2015-11-14 MED FILL — PRAVASTATIN NA 40 MG TAB: 40 | 30 days supply | Qty: 30 | Fill #2

## 2015-11-25 ENCOUNTER — Ambulatory Visit: Payer: Self-pay | Admitting: Internal Medicine

## 2015-12-02 MED FILL — MEGESTROL 40 MG TABLET: 40 | 30 days supply | Qty: 60 | Fill #1

## 2015-12-05 ENCOUNTER — Other Ambulatory Visit: Payer: Self-pay

## 2015-12-05 ENCOUNTER — Ambulatory Visit: Payer: Self-pay | Attending: Internal Medicine | Admitting: Internal Medicine

## 2015-12-05 ENCOUNTER — Encounter: Payer: Self-pay | Admitting: Internal Medicine

## 2015-12-05 VITALS — BP 134/90 | HR 88 | Temp 98.0°F | Resp 20 | Ht 66.0 in | Wt 176.4 lb

## 2015-12-05 DIAGNOSIS — E785 Hyperlipidemia, unspecified: Secondary | ICD-10-CM | POA: Insufficient documentation

## 2015-12-05 DIAGNOSIS — R002 Palpitations: Secondary | ICD-10-CM | POA: Insufficient documentation

## 2015-12-05 DIAGNOSIS — Z79899 Other long term (current) drug therapy: Secondary | ICD-10-CM | POA: Insufficient documentation

## 2015-12-05 DIAGNOSIS — I10 Essential (primary) hypertension: Secondary | ICD-10-CM | POA: Insufficient documentation

## 2015-12-05 DIAGNOSIS — F329 Major depressive disorder, single episode, unspecified: Secondary | ICD-10-CM | POA: Insufficient documentation

## 2015-12-05 DIAGNOSIS — F4310 Post-traumatic stress disorder, unspecified: Secondary | ICD-10-CM | POA: Insufficient documentation

## 2015-12-05 DIAGNOSIS — G8929 Other chronic pain: Secondary | ICD-10-CM | POA: Insufficient documentation

## 2015-12-05 DIAGNOSIS — F411 Generalized anxiety disorder: Secondary | ICD-10-CM | POA: Insufficient documentation

## 2015-12-05 LAB — CBC WITH DIFFERENTIAL/PLATELET
BASOS ABS: 0 10*3/uL (ref 0.0–0.1)
Basophils Relative: 0 % (ref 0–1)
Eosinophils Absolute: 0.1 10*3/uL (ref 0.0–0.7)
Eosinophils Relative: 1 % (ref 0–5)
HEMATOCRIT: 38.5 % (ref 36.0–46.0)
HEMOGLOBIN: 12.5 g/dL (ref 12.0–15.0)
Lymphocytes Relative: 35 % (ref 12–46)
Lymphs Abs: 2.3 10*3/uL (ref 0.7–4.0)
MCH: 25.3 pg — ABNORMAL LOW (ref 26.0–34.0)
MCHC: 32.5 g/dL (ref 30.0–36.0)
MCV: 77.8 fL — ABNORMAL LOW (ref 78.0–100.0)
MPV: 11.3 fL (ref 8.6–12.4)
Monocytes Absolute: 0.3 10*3/uL (ref 0.1–1.0)
Monocytes Relative: 5 % (ref 3–12)
NEUTROS ABS: 3.9 10*3/uL (ref 1.7–7.7)
NEUTROS PCT: 59 % (ref 43–77)
Platelets: 174 10*3/uL (ref 150–400)
RBC: 4.95 MIL/uL (ref 3.87–5.11)
RDW: 16.3 % — AB (ref 11.5–15.5)
WBC: 6.6 10*3/uL (ref 4.0–10.5)

## 2015-12-05 LAB — BASIC METABOLIC PANEL
BUN: 14 mg/dL (ref 7–25)
CHLORIDE: 105 mmol/L (ref 98–110)
CO2: 21 mmol/L (ref 20–31)
Calcium: 9.1 mg/dL (ref 8.6–10.2)
Creat: 1.18 mg/dL — ABNORMAL HIGH (ref 0.50–1.10)
Glucose, Bld: 233 mg/dL — ABNORMAL HIGH (ref 65–99)
POTASSIUM: 3.6 mmol/L (ref 3.5–5.3)
SODIUM: 138 mmol/L (ref 135–146)

## 2015-12-05 NOTE — Progress Notes (Signed)
Patient ID: Sabrina Ortiz, female   DOB: 1971-04-04, 45 y.o.   MRN: [id number]  CC: shaky hands  HPI: Sabrina Ortiz is a 45 y.o. female here today for a follow up visit.  Patient has past medical history of anxiety, depression, PTSD, HTN, HLD. Patient reports that she has been having feeling of shaky hands that are progressively worsening. Patient states that she was at a funeral recently and felt her hands shake more. Shakiness is exacerbated by feelings of stress and worry. Patient reports occasional heart palpitations during this time as well. She has been seen here for very similar symptoms in the past and was referred bacy to her psychiatrist since she has been on a host of psych medications in the past. Patient states that she never went back after our last visit because she still does not like going to [facility].   No Known Allergies Past Medical History  Diagnosis Date  . Mental disorder   . Depression   . Hypertension   . Anxiety   . PTSD (post-traumatic stress disorder)   . Chronic back pain    Current Outpatient Prescriptions on File Prior to Visit  Medication Sig Dispense Refill  . ALPRAZolam (XANAX) 0.5 MG tablet Take 1 tablet (0.5 mg total) by mouth at bedtime as needed for anxiety. 60 tablet 0  . cetirizine (ZYRTEC) 10 MG tablet Take 1 tablet (10 mg total) by mouth daily. 30 tablet 3  . lisinopril-hydrochlorothiazide (PRINZIDE,ZESTORETIC) 20-12.5 MG tablet Take 1 tablet by mouth daily. 30 tablet 5  . megestrol (MEGACE) 40 MG tablet Take 1 tablet (40 mg total) by mouth 2 (two) times daily. 60 tablet 3  . omeprazole (PRILOSEC) 20 MG capsule Take 1 capsule (20 mg total) by mouth daily. 30 capsule 3  . pravastatin (PRAVACHOL) 40 MG tablet Take 1 tablet (40 mg total) by mouth daily. 30 tablet 5  . gabapentin (NEURONTIN) 300 MG capsule Take 1 capsule (300 mg total) by mouth at bedtime. For anxiety/discomfort (Patient not taking: Reported on 09/11/2015) 30 capsule 3  . levocetirizine  (XYZAL) 5 MG tablet Take 1 tablet (5 mg total) by mouth every evening. (Patient not taking: Reported on 09/11/2015) 30 tablet 3  . sertraline (ZOLOFT) 50 MG tablet Take 1 tablet (50 mg total) by mouth daily. For depression. (Patient not taking: Reported on 09/11/2015) 30 tablet 3  . traZODone (DESYREL) 50 MG tablet Take 1 tablet (50 mg total) by mouth at bedtime. (Patient not taking: Reported on 09/11/2015) 30 tablet 3   No current facility-administered medications on file prior to visit.   History reviewed. No pertinent family history. Social History   Social History  . Marital Status: Single    Spouse Name: N/A  . Number of Children: N/A  . Years of Education: N/A   Occupational History  . Not on file.   Social History Main Topics  . Smoking status: Never Smoker   . Smokeless tobacco: Never Used  . Alcohol Use: No  . Drug Use: No  . Sexual Activity: Not on file   Other Topics Concern  . Not on file   Social History Narrative    Review of Systems: Other than what is stated in HPI, all other systems are negative.   Objective:   Filed Vitals:   12/05/15 1420  BP: 134/90  Pulse: 88  Temp: 98 F (36.7 C)  Resp: 20    Physical Exam  Constitutional: She is oriented to person, place, and time.  Cardiovascular: Normal rate, regular rhythm and normal heart sounds.   Pulmonary/Chest: Effort normal and breath sounds normal.  Neurological: She is alert and oriented to person, place, and time.  Skin: Skin is warm and dry.  Psychiatric: She has a normal mood and affect.     Lab Results  Component Value Date   WBC 8.1 05/12/2013   HGB 12.8 05/12/2013   HCT 37.6 05/12/2013   MCV 76.6* 05/12/2013   PLT 167 05/12/2013   Lab Results  Component Value Date   CREATININE 0.94 11/21/2014   BUN 14 11/21/2014   NA 139 11/21/2014   K 4.1 11/21/2014   CL 105 11/21/2014   CO2 25 11/21/2014    Lab Results  Component Value Date   HGBA1C 6.1 06/15/2013   Lipid Panel      Component Value Date/Time   CHOL 182 05/03/2014 1220   TRIG 112 05/03/2014 1220   HDL 50 05/03/2014 1220   CHOLHDL 3.6 05/03/2014 1220   VLDL 22 05/03/2014 1220   LDLCALC 110* 05/03/2014 1220       Assessment and plan:   Riona was seen today for anxiety.  Diagnoses and all orders for this visit:  Heart palpitations -     EKG 12-Lead -     Basic Metabolic Panel -     Hemoglobin A1c -     CBC with Differential -     Vitamin D, 25-hydroxy EKG: unchanged from previous tracings.  GAD (generalized anxiety disorder) I have again advised patient to seek psychiatry care to be placed on proper medication. Patient given [facility]. Discussed ways to deal with stress and anxiety.  Return if symptoms worsen or fail to improve.       Ambrose Finland, NP-C [facility] [phone] 12/05/2015, 2:34 PM

## 2015-12-05 NOTE — Progress Notes (Signed)
Patient c/o shaky hands. Patient reports that she randomly been having chest pain.  Patient states that sxs gets worse over time.  Patient denies any other pain.

## 2015-12-06 LAB — HEMOGLOBIN A1C
HEMOGLOBIN A1C: 7.1 % — AB (ref <5.7)
MEAN PLASMA GLUCOSE: 157 mg/dL

## 2015-12-06 LAB — VITAMIN D 25 HYDROXY (VIT D DEFICIENCY, FRACTURES): Vit D, 25-Hydroxy: 21 ng/mL — ABNORMAL LOW (ref 30–100)

## 2015-12-11 ENCOUNTER — Other Ambulatory Visit: Payer: Self-pay | Admitting: Internal Medicine

## 2015-12-13 ENCOUNTER — Ambulatory Visit: Payer: Self-pay

## 2015-12-23 ENCOUNTER — Telehealth: Payer: Self-pay

## 2015-12-23 NOTE — Telephone Encounter (Signed)
-----   Message from Ambrose FinlandValerie A Keck, NP sent at 12/19/2015  9:42 PM EDT ----- Patient tested positive for diabetes. Have her to come back for a doctors appointment to discuss treatment with Metformin very soon.  Vitamin D is low get OTC Vitamin D 1,000 IU to take once daily.

## 2015-12-23 NOTE — Telephone Encounter (Signed)
Contacted patient, patient didn't answer. Left a message for the patient to return my call asap.   Note to patient:  ----- Message from Ambrose FinlandValerie A Keck, NP sent at 12/19/2015 9:42 PM EDT ----- Patient tested positive for diabetes. Have her to come back for a doctors appointment to discuss treatment with Metformin very soon.  Vitamin D is low get OTC Vitamin D 1,000 IU to take once daily.

## 2015-12-23 NOTE — Telephone Encounter (Signed)
Per nurse results were given to patient.  Patient was schedule appointment with new PCP for 5/1

## 2015-12-24 MED FILL — ?OMEPRAZOLE DR 20 MG CAPSUL: 20 | 30 days supply | Qty: 30 | Fill #1

## 2015-12-24 MED FILL — LISINOPRIL-HCTZ 20-12.5 MG: 20-12.5 | 30 days supply | Qty: 30 | Fill #3

## 2015-12-24 MED FILL — PRAVASTATIN NA 40 MG TAB: 40 | 30 days supply | Qty: 30 | Fill #3

## 2016-01-06 ENCOUNTER — Ambulatory Visit: Payer: Self-pay | Admitting: Family Medicine

## 2016-02-12 MED FILL — ?OMEPRAZOLE DR 20 MG CAPSUL: 20 | 30 days supply | Qty: 30 | Fill #2

## 2016-02-13 MED FILL — LISINOPRIL-HCTZ 20-12.5 MG: 20-12.5 | 30 days supply | Qty: 30 | Fill #4

## 2016-02-13 MED FILL — ?PRAVASTATIN NA 40 MG TAB: 40 MG | 30 days supply | Qty: 30 | Fill #4

## 2016-02-13 MED FILL — MEGESTROL 40 MG TABLET: 40 | 30 days supply | Qty: 60 | Fill #2

## 2016-03-20 MED FILL — ?OMEPRAZOLE DR 20 MG CAPSUL: 20 | 30 days supply | Qty: 30 | Fill #3

## 2016-03-26 MED FILL — LISINOPRIL-HCTZ 20-12.5 MG: 20-12.5 | 30 days supply | Qty: 30 | Fill #5

## 2016-03-26 MED FILL — ?PRAVASTATIN NA 40 MG TAB: 40 MG | 30 days supply | Qty: 30 | Fill #5

## 2016-05-06 ENCOUNTER — Other Ambulatory Visit: Payer: Self-pay | Admitting: Internal Medicine

## 2016-05-06 DIAGNOSIS — K219 Gastro-esophageal reflux disease without esophagitis: Secondary | ICD-10-CM

## 2016-05-06 DIAGNOSIS — I10 Essential (primary) hypertension: Secondary | ICD-10-CM

## 2016-05-06 DIAGNOSIS — E785 Hyperlipidemia, unspecified: Secondary | ICD-10-CM

## 2016-05-12 MED FILL — OMEPRAZOLE DR 20 MG CAPSULE: 20 | 30 days supply | Qty: 30 | Fill #0

## 2016-05-12 MED FILL — ?PRAVASTATIN NA 40 MG TAB: 40 MG | 30 days supply | Qty: 30 | Fill #0

## 2016-05-12 MED FILL — MEGESTROL 40 MG TABLET: 40 | 30 days supply | Qty: 60 | Fill #3

## 2016-05-12 MED FILL — LISINOPRIL-HCTZ 20-12.5 MG: 20-12.5 | 30 days supply | Qty: 30 | Fill #0

## 2016-06-06 ENCOUNTER — Encounter (HOSPITAL_COMMUNITY): Payer: Self-pay | Admitting: Emergency Medicine

## 2016-06-06 ENCOUNTER — Emergency Department (HOSPITAL_COMMUNITY)
Admission: EM | Admit: 2016-06-06 | Discharge: 2016-06-06 | Disposition: A | Discharge status: Home / Self Care | Payer: Self-pay | Attending: Emergency Medicine | Admitting: Emergency Medicine

## 2016-06-06 DIAGNOSIS — I10 Essential (primary) hypertension: Secondary | ICD-10-CM | POA: Insufficient documentation

## 2016-06-06 DIAGNOSIS — J020 Streptococcal pharyngitis: Secondary | ICD-10-CM | POA: Insufficient documentation

## 2016-06-06 LAB — RAPID STREP SCREEN (MED CTR MEBANE ONLY): Streptococcus, Group A Screen (Direct): POSITIVE — AB

## 2016-06-06 MED ORDER — PENICILLIN G BENZATHINE 1200000 UNIT/2ML IM SUSP
1.2000 10*6.[IU] | Freq: Once | INTRAMUSCULAR | Status: AC
  Administered 2016-06-06: 1.2 10*6.[IU] via INTRAMUSCULAR
  Filled 2016-06-06: qty 2

## 2016-06-06 MED ORDER — DEXAMETHASONE SODIUM PHOSPHATE 10 MG/ML IJ SOLN
10.0000 mg | Freq: Once | INTRAMUSCULAR | Status: AC
  Administered 2016-06-06: 10 mg via INTRAMUSCULAR
  Filled 2016-06-06: qty 1

## 2016-06-06 NOTE — ED Triage Notes (Signed)
Pt reports sore throat x2 days, also states her ears "seem funny; itchy." denies fever, states cough starting this morning.

## 2016-06-06 NOTE — ED Provider Notes (Signed)
[facility]-EMERGENCY DEPT Provider Note   CSN: [id number] Arrival date & time: 06/06/16  0450     History   Chief Complaint Chief Complaint  Patient presents with  . Sore Throat    HPI Sabrina Ortiz is a 45 y.o. female.  Sabrina Ortiz is a 45 y.o. female with h/o depression, anxiety, HTN, HCHOL, GERD presents to ED with complaint of sore throat. Sore throat started yesterday morning, described as dull ache. States it hurts to swallow. Has associated itchy left ear, nausea, and cervical adenopathy. She reports onset of non-productive cough this morning. Denies fever, nasal congestion, rhinorrhea, watery eyes, trouble swallowing, trouble breathing, headache, abdominal pain. She is able to eat and drink. Managing oral secretions. She has tried throat spray, halls, salt water, and hot tea with minimal relief.       Past Medical History:  Diagnosis Date  . Anxiety   . Chronic back pain   . Depression   . Hypertension   . Mental disorder   . PTSD (post-traumatic stress disorder)     Patient Active Problem List   Diagnosis Date Noted  . Unspecified essential hypertension 06/15/2013  . Dyslipidemia 06/15/2013  . Neuropathy (HCC) 06/15/2013  . Generalized anxiety disorder 03/10/2012  . Major depressive disorder, recurrent, 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HCC) 03/08/2012    History reviewed. No pertinent surgical history.  OB History    Gravida Para Term Preterm AB Living   3 1     2 1    SAB TAB Ectopic Multiple Live Births     2             Home Medications    Prior to Admission medications   Medication Sig Start Date End Date Taking? Authorizing Provider  ALPRAZolam Prudy Feeler) 0.5 MG tablet Take 1 tablet (0.5 mg total) by mouth at bedtime as needed for anxiety. 06/11/15   Ambrose Finland, NP  cetirizine (ZYRTEC) 10 MG tablet Take 1 tablet (10 mg total) by mouth daily. 01/28/15   Doris Cheadle, MD  gabapentin (NEURONTIN) 300 MG capsule Take 1 capsule (300 mg total) by mouth  at bedtime. For anxiety/discomfort Patient not taking: Reported on 09/11/2015 01/28/15   Doris Cheadle, MD  levocetirizine (XYZAL) 5 MG tablet Take 1 tablet (5 mg total) by mouth every evening. Patient not taking: Reported on 09/11/2015 01/28/15   Doris Cheadle, MD  lisinopril-hydrochlorothiazide (PRINZIDE,ZESTORETIC) 20-12.5 MG tablet TAKE 1 TABLET BY MOUTH DAILY. 05/06/16   Quentin Angst, MD  megestrol (MEGACE) 40 MG tablet Take 1 tablet (40 mg total) by mouth 2 (two) times daily. 09/11/15   Peggy Constant, MD  omeprazole (PRILOSEC) 20 MG capsule TAKE 1 CAPSULE BY MOUTH DAILY. 05/06/16   Quentin Angst, MD  pravastatin (PRAVACHOL) 40 MG tablet TAKE 1 TABLET BY MOUTH DAILY. 05/06/16   Quentin Angst, MD  sertraline (ZOLOFT) 50 MG tablet Take 1 tablet (50 mg total) by mouth daily. For depression. Patient not taking: Reported on 09/11/2015 01/28/15   Doris Cheadle, MD  traZODone (DESYREL) 50 MG tablet Take 1 tablet (50 mg total) by mouth at bedtime. Patient not taking: Reported on 09/11/2015 01/28/15   Doris Cheadle, MD    Family History No family history on file.  Social History Social History  Substance Use Topics  . Smoking status: Never Smoker  . Smokeless tobacco: Never Used  . Alcohol use No     Allergies   Review of patient's allergies indicates no known allergies.  Review of Systems Review of Systems  Constitutional: Negative for fever.  HENT: Positive for sore throat. Negative for congestion, rhinorrhea and trouble swallowing.        Ear itching  Eyes: Negative for discharge.  Respiratory: Positive for cough. Negative for shortness of breath.   Gastrointestinal: Positive for nausea. Negative for vomiting.  Musculoskeletal: Positive for neck pain.  Neurological: Negative for headaches.  Hematological: Positive for adenopathy.     Physical Exam Updated Vital Signs BP (!) 159/118 (BP Location: Left Arm)   Pulse 111   Temp 98.9 F (37.2 C)   Resp 17   Ht 5\' 6"   (1.676 m)   Wt 79.8 kg   SpO2 98%   BMI 28.41 kg/m   Physical Exam  Constitutional: She appears well-developed and well-nourished. No distress.  HENT:  Head: Normocephalic and atraumatic.  Right Ear: Tympanic membrane, external ear and ear canal normal.  Left Ear: Tympanic membrane, external ear and ear canal normal.  Nose: Nose normal.  Mouth/Throat: Uvula is midline. Mucous membranes are dry ( mild). No trismus in the jaw. Posterior oropharyngeal erythema present.  No trismus. Uvula midline. Erythematous posterior oropharynx.   Eyes: Conjunctivae and EOM are normal. Pupils are equal, round, and reactive to light. No scleral icterus.  Neck: Normal range of motion. No spinous process tenderness present. No neck rigidity. Normal range of motion present.  No nuchal rigidity. ROM intact.   Cardiovascular: Regular rhythm, normal heart sounds and intact distal pulses.   No murmur heard. Pulmonary/Chest: Effort normal and breath sounds normal. No respiratory distress. She has no wheezes. She has no rales.  Abdominal: She exhibits no distension.  Neurological: She is alert. She is not disoriented. GCS eye subscore is 4. GCS verbal subscore is 5. GCS motor subscore is 6.  Skin: Skin is warm and dry. She is not diaphoretic.  Psychiatric: She has a normal mood and affect. Her behavior is normal.     ED Treatments / Results  Labs (all labs ordered are listed, but only abnormal results are displayed) Labs Reviewed  RAPID STREP SCREEN (NOT [facility]) - Abnormal; Notable for the following:       Result Value   Streptococcus, Group A Screen (Direct) POSITIVE (*)    All other components within normal limits    EKG  EKG Interpretation None       Radiology No results found.  Procedures Procedures (including critical care time)  Medications Ordered in ED Medications  penicillin g benzathine (BICILLIN LA) 1200000 UNIT/2ML injection 1.2 Million Units (1.2 Million Units Intramuscular  Given 06/06/16 0636)  dexamethasone (DECADRON) injection 10 mg (10 mg Intramuscular Given 06/06/16 [id number])     Initial Impression / Assessment and Plan / ED Course  I have reviewed the triage vital signs and the nursing notes.  Pertinent labs & imaging results that were available during my care of the patient were reviewed by me and considered in my medical decision making (see chart for details).  Vitals:   06/06/16 0450 06/06/16 0641  BP: (!) 159/118 (!) 148/105  Pulse: 111 91  Resp: 17 16  Temp: 98.9 F (37.2 C) 99.2 F (37.3 C)  TempSrc:  Oral  SpO2: 98% 100%  Weight: 79.8 kg   Height: 5\' 6"  (1.676 m)     Clinical Course    Patient presents to ED sore throat. Patient is afebrile and non-toxic appearing in NAD. Vital signs initially remarkable for tachycardiac; however, on re-evaluation improved. Blood pressure slightly  elevated - pt has h/o elevated BP. Pt afebrile with posterior oropharynx erythema, shotty cervical lymphadenopathy; diagnosis of strep. Treated in the ED with steroids and PCN IM.  Pt appears mildly dehydrated, discussed importance of water rehydration. Presentation non concerning for PTA or infxn spread to soft tissue. No trismus or uvula deviation. No nuchal rigidity. Neck ROM intact. Follow up with PCP as needed. Specific return precautions discussed. Intact airway, patient managing oral secretions. Recommended PCP follow up.      Final Clinical Impressions(s) / ED Diagnoses   Final diagnoses:  Strep throat    New Prescriptions New Prescriptions   No medications on file     Lona Kettle, PA-C 06/06/16 1610    Rolland Porter, MD 06/11/16 [phone]

## 2016-06-06 NOTE — Discharge Instructions (Signed)
Read the information below.  You have strep throat. You were treated with antibiotics and steroids in the ED.  It is very important that you stay well hydrated.  You are contagious for the next 24 hours.  You can use cepacol drops or spray to help with symptom relief.  Be sure to follow up with your primary provider for re-evaluation next week.  You may return to the Emergency Department at any time for worsening condition or any new symptoms that concern you. Return to ED if you are unable to open your mouth, have trouble swallowing, have trouble breathing, or develop any other symptoms that are new or concerning to you.

## 2016-07-01 ENCOUNTER — Other Ambulatory Visit: Payer: Self-pay | Admitting: Internal Medicine

## 2016-07-01 DIAGNOSIS — I10 Essential (primary) hypertension: Secondary | ICD-10-CM

## 2016-07-01 DIAGNOSIS — K219 Gastro-esophageal reflux disease without esophagitis: Secondary | ICD-10-CM

## 2016-07-01 DIAGNOSIS — E785 Hyperlipidemia, unspecified: Secondary | ICD-10-CM

## 2016-07-01 MED FILL — LISINOPRIL-HCTZ 20-12.5 MG: 20-12.5 | 30 days supply | Qty: 30 | Fill #0

## 2016-07-01 MED FILL — ?OMEPRAZOLE DR 20 MG CAPSUL: 20 | 30 days supply | Qty: 30 | Fill #0

## 2016-07-01 MED FILL — PRAVASTATIN NA 40 MG TAB: 40 | 30 days supply | Qty: 30 | Fill #0

## 2016-07-10 ENCOUNTER — Ambulatory Visit: Payer: Self-pay | Attending: Family Medicine | Admitting: Family Medicine

## 2016-07-10 ENCOUNTER — Encounter: Payer: Self-pay | Admitting: Family Medicine

## 2016-07-10 ENCOUNTER — Other Ambulatory Visit: Payer: Self-pay | Admitting: Family Medicine

## 2016-07-10 VITALS — BP 134/87 | HR 86 | Temp 98.6°F | Ht 66.0 in | Wt 177.2 lb

## 2016-07-10 DIAGNOSIS — K219 Gastro-esophageal reflux disease without esophagitis: Secondary | ICD-10-CM | POA: Insufficient documentation

## 2016-07-10 DIAGNOSIS — I10 Essential (primary) hypertension: Secondary | ICD-10-CM | POA: Insufficient documentation

## 2016-07-10 DIAGNOSIS — F329 Major depressive disorder, single episode, unspecified: Secondary | ICD-10-CM | POA: Insufficient documentation

## 2016-07-10 DIAGNOSIS — G8929 Other chronic pain: Secondary | ICD-10-CM | POA: Insufficient documentation

## 2016-07-10 DIAGNOSIS — E785 Hyperlipidemia, unspecified: Secondary | ICD-10-CM | POA: Insufficient documentation

## 2016-07-10 DIAGNOSIS — F4310 Post-traumatic stress disorder, unspecified: Secondary | ICD-10-CM | POA: Insufficient documentation

## 2016-07-10 DIAGNOSIS — M545 Low back pain: Secondary | ICD-10-CM | POA: Insufficient documentation

## 2016-07-10 DIAGNOSIS — N898 Other specified noninflammatory disorders of vagina: Secondary | ICD-10-CM | POA: Insufficient documentation

## 2016-07-10 NOTE — Progress Notes (Signed)
Subjective:  Patient ID: Sabrina Ortiz, female    DOB: 1970-11-22  Age: 45 y.o. MRN: [id number]  CC: Vaginal Discharge   HPI Sabrina Ortiz presents Complaining of a one-week history of vaginal discharge. She had not been sexually active until 2 weeks ago when she engaged in sexual activity without protection. Noticed yellowish vaginal discharge with no odor;  denies vaginal itching or urinary symptoms. Has no nausea, abdominal pain or fever.  Medical history significant for hypertension, anxiety, GERD, hyperlipidemia.  Past Medical History:  Diagnosis Date  . Anxiety   . Chronic back pain   . Depression   . Hypertension   . Mental disorder   . PTSD (post-traumatic stress disorder)     History reviewed. No pertinent surgical history.   Outpatient Medications Prior to Visit  Medication Sig Dispense Refill  . ALPRAZolam (XANAX) 0.5 MG tablet Take 1 tablet (0.5 mg total) by mouth at bedtime as needed for anxiety. 60 tablet 0  . cetirizine (ZYRTEC) 10 MG tablet Take 1 tablet (10 mg total) by mouth daily. 30 tablet 3  . gabapentin (NEURONTIN) 300 MG capsule Take 1 capsule (300 mg total) by mouth at bedtime. For anxiety/discomfort 30 capsule 3  . lisinopril-hydrochlorothiazide (PRINZIDE,ZESTORETIC) 20-12.5 MG tablet TAKE 1 TABLET BY MOUTH DAILY. 30 tablet 0  . megestrol (MEGACE) 40 MG tablet Take 1 tablet (40 mg total) by mouth 2 (two) times daily. 60 tablet 3  . omeprazole (PRILOSEC) 20 MG capsule TAKE 1 CAPSULE BY MOUTH DAILY. 30 capsule 0  . pravastatin (PRAVACHOL) 40 MG tablet TAKE 1 TABLET BY MOUTH DAILY. 30 tablet 0  . traZODone (DESYREL) 50 MG tablet Take 1 tablet (50 mg total) by mouth at bedtime. 30 tablet 3  . levocetirizine (XYZAL) 5 MG tablet Take 1 tablet (5 mg total) by mouth every evening. (Patient not taking: Reported on 07/10/2016) 30 tablet 3  . sertraline (ZOLOFT) 50 MG tablet Take 1 tablet (50 mg total) by mouth daily. For depression. (Patient not taking:  Reported on 07/10/2016) 30 tablet 3   No facility-administered medications prior to visit.     ROS Review of Systems  Constitutional: Negative for activity change and appetite change.  HENT: Negative for sinus pressure and sore throat.   Respiratory: Negative for chest tightness, shortness of breath and wheezing.   Cardiovascular: Negative for chest pain and palpitations.  Gastrointestinal: Negative for abdominal distention, abdominal pain and constipation.  Genitourinary:       See history of present illness  Musculoskeletal: Negative.   Psychiatric/Behavioral: Negative for behavioral problems and dysphoric mood.    Objective:  BP 134/87 (BP Location: Right Arm, Patient Position: Sitting, Cuff Size: Large)   Pulse 86   Temp 98.6 F (37 C) (Oral)   Ht 5\' 6"  (1.676 m)   Wt 177 lb 3.2 oz (80.4 kg)   SpO2 99%   BMI 28.60 kg/m   BP/Weight 07/10/2016 06/06/2016 12/05/2015  Systolic BP 134 148 134  Diastolic BP 87 105 90  Wt. (Lbs) 177.2 176 176.4  BMI 28.6 28.41 28.49  Some encounter information is confidential and restricted. Go to Review Flowsheets activity to see all data.      Physical Exam  Constitutional: She is oriented to person, place, and time. She appears well-developed and well-nourished.  Cardiovascular: Normal rate, normal heart sounds and intact distal pulses.   No murmur heard. Pulmonary/Chest: Effort normal and breath sounds normal. She has no wheezes. She has no rales. She exhibits  no tenderness.  Abdominal: Soft. Bowel sounds are normal. She exhibits no distension and no mass. There is no tenderness.  Genitourinary:  Genitourinary Comments: Yellowish vaginal discharge  Musculoskeletal: Normal range of motion.  Neurological: She is alert and oriented to person, place, and time.     Assessment & Plan:   1. Vaginal discharge Sexual precautions to prevent STDs - Cervicovaginal ancillary only - HSV(herpes smplx)abs-1+2(IgG+IgM)-bld   No orders of the  defined types were placed in this encounter.   Follow-up: Return in about 3 weeks (around 07/31/2016) for Follow-up of chronic medical conditions.   Jaclyn ShaggyEnobong Amao MD

## 2016-07-13 LAB — HSV 1/2 AB (IGM), IFA W/RFLX TITER
HSV 1 IGM SCREEN: NEGATIVE
HSV 2 IgM Screen: NEGATIVE

## 2016-07-13 LAB — HSV(HERPES SIMPLEX VRS) I + II AB-IGG
HSV 1 Glycoprotein G Ab, IgG: 19.7 Index — ABNORMAL HIGH (ref <0.90)
HSV 2 Glycoprotein G Ab, IgG: >23 Index — ABNORMAL HIGH (ref <0.90)

## 2016-07-14 ENCOUNTER — Telehealth: Payer: Self-pay | Admitting: Family Medicine

## 2016-07-14 DIAGNOSIS — N898 Other specified noninflammatory disorders of vagina: Secondary | ICD-10-CM

## 2016-07-14 NOTE — Telephone Encounter (Signed)
Patient notified per Dr. Venetia NightAmao that her vaginal culture was cancelled and a urine needs to be obtained.  Patient states understanding and will be in today to give that urine sample. Lab and front desk notified.

## 2016-07-14 NOTE — Telephone Encounter (Signed)
Please inform this patient had a vaginal culture was canceled. I will need her to come into the clinic so we can send off a urine specimen to check for STDs. I have placed future orders in Epic for this. Thank you.

## 2016-07-15 ENCOUNTER — Other Ambulatory Visit: Payer: Self-pay

## 2016-07-15 DIAGNOSIS — N898 Other specified noninflammatory disorders of vagina: Secondary | ICD-10-CM

## 2016-07-16 LAB — URINE CYTOLOGY ANCILLARY ONLY
Chlamydia: NEGATIVE
Neisseria Gonorrhea: NEGATIVE
TRICH (WINDOWPATH): POSITIVE — AB

## 2016-07-17 ENCOUNTER — Other Ambulatory Visit: Payer: Self-pay | Admitting: Family Medicine

## 2016-07-17 DIAGNOSIS — A599 Trichomoniasis, unspecified: Secondary | ICD-10-CM

## 2016-07-17 DIAGNOSIS — A6009 Herpesviral infection of other urogenital tract: Secondary | ICD-10-CM | POA: Insufficient documentation

## 2016-07-17 MED ORDER — METRONIDAZOLE 500 MG PO TABS: 2000.0000 mg | ORAL_TABLET | Freq: Once | ORAL | 0 refills | Status: AC

## 2016-07-17 MED ORDER — VALACYCLOVIR HCL 1 G PO TABS: 1000.0000 mg | ORAL_TABLET | Freq: Two times a day (BID) | ORAL | 0 refills | Status: DC

## 2016-07-17 MED FILL — ?METRONIDAZOLE 500 MG TABLE: 500 | 1 days supply | Qty: 4 | Fill #0

## 2016-07-17 MED FILL — ?VALACYCLOVIR HCL 1 GRAM TA: 1 | 10 days supply | Qty: 20 | Fill #0

## 2016-07-20 ENCOUNTER — Telehealth: Payer: Self-pay

## 2016-07-20 LAB — URINE CYTOLOGY ANCILLARY ONLY
BACTERIAL VAGINITIS: NEGATIVE
Candida vaginitis: NEGATIVE

## 2016-07-20 NOTE — Telephone Encounter (Signed)
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 Herpes type II (genital herpes) is an STD and I have sent a prescription for Valtrex to the pharmacy. You could have flares which presents with genital ulcers or burning and at other times could be asymptomatic but will always test positive. For frequent flares you will need to be on a preventative medication .Should you have any questions regarding this feel free to schedule an appointment to see me.

## 2016-07-20 NOTE — Telephone Encounter (Signed)
Writer spoke with patient regarding her test results per Dr. Venetia NightAmao.  Patient stated that she did see the msg on My Chart regarding the trichomonas and did treat appropriately.  Patient was very tearful about the genital herpes diagnosis.  Patient stated understanding and will call with any questions.

## 2016-07-24 ENCOUNTER — Ambulatory Visit: Payer: Self-pay | Admitting: Family Medicine

## 2016-08-24 ENCOUNTER — Other Ambulatory Visit: Payer: Self-pay | Admitting: Internal Medicine

## 2016-08-24 ENCOUNTER — Other Ambulatory Visit: Payer: Self-pay | Admitting: Obstetrics and Gynecology

## 2016-08-24 DIAGNOSIS — I10 Essential (primary) hypertension: Secondary | ICD-10-CM

## 2016-08-24 DIAGNOSIS — E785 Hyperlipidemia, unspecified: Secondary | ICD-10-CM

## 2016-08-24 MED FILL — LISINOPRIL-HCTZ 20-12.5 MG: 20-12.5 | 30 days supply | Qty: 30 | Fill #0

## 2016-08-24 MED FILL — PRAVASTATIN NA 40 MG TAB: 40 | 30 days supply | Qty: 30 | Fill #0

## 2016-08-26 ENCOUNTER — Other Ambulatory Visit: Payer: Self-pay | Admitting: Internal Medicine

## 2016-08-26 DIAGNOSIS — K219 Gastro-esophageal reflux disease without esophagitis: Secondary | ICD-10-CM

## 2016-08-26 MED FILL — OMEPRAZOLE DR 20 MG CAPSULE: 20 | 30 days supply | Qty: 30 | Fill #0

## 2016-08-28 MED FILL — MEGESTROL 40 MG TABLET: 40 | 30 days supply | Qty: 60 | Fill #0

## 2016-10-14 MED FILL — LISINOPRIL-HCTZ 20-12.5 MG: 20-12.5 | 30 days supply | Qty: 30 | Fill #1

## 2016-11-04 MED FILL — PRAVASTATIN NA 40 MG TAB: 40 | 30 days supply | Qty: 30 | Fill #1

## 2016-11-04 MED FILL — ?OMEPRAZOLE DR 20 MG CAPSUL: 20 | 30 days supply | Qty: 30 | Fill #1

## 2016-12-10 MED FILL — LISINOPRIL-HCTZ 20-12.5 MG: 20-12.5 | 30 days supply | Qty: 30 | Fill #2

## 2016-12-10 MED FILL — MEGESTROL 40 MG TABLET: 40 | 30 days supply | Qty: 60 | Fill #1

## 2017-01-07 MED FILL — PRAVASTATIN NA 40 MG TAB: 40 | 30 days supply | Qty: 30 | Fill #2

## 2017-01-07 MED FILL — OMEPRAZOLE DR 20 MG CAPSULE: 20 | 30 days supply | Qty: 30 | Fill #2

## 2017-02-02 ENCOUNTER — Other Ambulatory Visit: Payer: Self-pay | Admitting: Family Medicine

## 2017-02-02 DIAGNOSIS — I10 Essential (primary) hypertension: Secondary | ICD-10-CM

## 2017-02-02 MED FILL — LISINOPRIL-HCTZ 20-12.5 MG: 20-12.5 | 30 days supply | Qty: 30 | Fill #3

## 2017-02-19 ENCOUNTER — Other Ambulatory Visit: Payer: Self-pay | Admitting: Family Medicine

## 2017-02-19 DIAGNOSIS — A6009 Herpesviral infection of other urogenital tract: Secondary | ICD-10-CM

## 2017-03-04 ENCOUNTER — Other Ambulatory Visit: Payer: Self-pay | Admitting: Family Medicine

## 2017-03-04 DIAGNOSIS — K219 Gastro-esophageal reflux disease without esophagitis: Secondary | ICD-10-CM

## 2017-03-04 MED FILL — ?OMEPRAZOLE DR 20 MG CAPSUL: 20 | 30 days supply | Qty: 30 | Fill #0

## 2017-03-04 MED FILL — PRAVASTATIN NA 40 MG TAB: 40 | 30 days supply | Qty: 30 | Fill #3

## 2017-03-04 MED FILL — ?VALACYCLOVIR 1 GRAM TAB: 1000 MG | 10 days supply | Qty: 20 | Fill #0

## 2017-03-19 ENCOUNTER — Other Ambulatory Visit: Payer: Self-pay | Admitting: Family Medicine

## 2017-03-19 DIAGNOSIS — I10 Essential (primary) hypertension: Secondary | ICD-10-CM

## 2017-03-19 MED FILL — MEGESTROL 40 MG TABLET: 40 | 30 days supply | Qty: 60 | Fill #2

## 2017-03-25 DIAGNOSIS — H40033 Anatomical narrow angle, bilateral: Secondary | ICD-10-CM | POA: Diagnosis not present

## 2017-03-25 DIAGNOSIS — H1013 Acute atopic conjunctivitis, bilateral: Secondary | ICD-10-CM | POA: Diagnosis not present

## 2017-03-31 ENCOUNTER — Ambulatory Visit: Payer: Medicare Other | Attending: Family Medicine | Admitting: Family Medicine

## 2017-03-31 ENCOUNTER — Encounter: Payer: Self-pay | Admitting: Family Medicine

## 2017-03-31 VITALS — BP 150/104 | HR 93 | Temp 98.1°F | Ht 66.0 in | Wt 177.0 lb

## 2017-03-31 DIAGNOSIS — R251 Tremor, unspecified: Secondary | ICD-10-CM | POA: Diagnosis not present

## 2017-03-31 DIAGNOSIS — F419 Anxiety disorder, unspecified: Secondary | ICD-10-CM | POA: Diagnosis not present

## 2017-03-31 DIAGNOSIS — E785 Hyperlipidemia, unspecified: Secondary | ICD-10-CM | POA: Diagnosis not present

## 2017-03-31 DIAGNOSIS — I10 Essential (primary) hypertension: Secondary | ICD-10-CM | POA: Diagnosis not present

## 2017-03-31 DIAGNOSIS — K219 Gastro-esophageal reflux disease without esophagitis: Secondary | ICD-10-CM | POA: Diagnosis not present

## 2017-03-31 DIAGNOSIS — Z79899 Other long term (current) drug therapy: Secondary | ICD-10-CM | POA: Insufficient documentation

## 2017-03-31 DIAGNOSIS — F4310 Post-traumatic stress disorder, unspecified: Secondary | ICD-10-CM | POA: Diagnosis not present

## 2017-03-31 DIAGNOSIS — F329 Major depressive disorder, single episode, unspecified: Secondary | ICD-10-CM | POA: Insufficient documentation

## 2017-03-31 MED ORDER — OMEPRAZOLE 20 MG PO CPDR: 20.0000 mg | DELAYED_RELEASE_CAPSULE | Freq: Every day | ORAL | 5 refills | Status: DC

## 2017-03-31 MED ORDER — PRAVASTATIN SODIUM 40 MG PO TABS: 40.0000 mg | ORAL_TABLET | Freq: Every day | ORAL | 5 refills | Status: DC

## 2017-03-31 MED ORDER — LISINOPRIL-HYDROCHLOROTHIAZIDE 20-12.5 MG PO TABS: 1.0000 | ORAL_TABLET | Freq: Every day | ORAL | 5 refills | Status: DC

## 2017-03-31 MED FILL — PRAVASTATIN NA 40 MG TAB: 40 | 30 days supply | Qty: 30 | Fill #0

## 2017-03-31 MED FILL — ?OMEPRAZOLE DR 20 MG CAPSUL: 20 | 30 days supply | Qty: 30 | Fill #0

## 2017-03-31 MED FILL — LISINOPRIL-HCTZ 20-12.5 MG: 20-12.5 | 30 days supply | Qty: 30 | Fill #0

## 2017-03-31 NOTE — Progress Notes (Signed)
Subjective:  Patient ID: Sabrina Ortiz, female    DOB: 08/21/1971  Age: 46 y.o. MRN: [id number]  CC: Hypertension   HPI Sabrina Ortiz is a 46 year old female with a history of hypertension, hyperlipidemia, GERD, anxiety and depression who presents today for a follow-up visit. Her last office visit was 8 months ago.  She has run out of all her medications hence her elevated blood pressure.  Anxiety is managed at [facility] where she also receives medications-she is unable to recall the name of the medication by heart.  Reflux symptoms are stable.  Complains of intermittent episodes of tremors in her hands and feet and sometimes wakes up with palpitations. She thinks this is related to her anxiety She informs me that Sominex was the only medication that helps her symptoms. She denies chest pains or shortness of breath.  Past Medical History:  Diagnosis Date  . Anxiety   . Chronic back pain   . Depression   . Hypertension   . Mental disorder   . PTSD (post-traumatic stress disorder)     No past surgical history on file.  No Known Allergies   Outpatient Medications Prior to Visit  Medication Sig Dispense Refill  . cetirizine (ZYRTEC) 10 MG tablet Take 1 tablet (10 mg total) by mouth daily. 30 tablet 3  . gabapentin (NEURONTIN) 300 MG capsule Take 1 capsule (300 mg total) by mouth at bedtime. For anxiety/discomfort 30 capsule 3  . megestrol (MEGACE) 40 MG tablet TAKE 1 TABLET BY MOUTH TWICE DAILY. 60 tablet 3  . traZODone (DESYREL) 50 MG tablet Take 1 tablet (50 mg total) by mouth at bedtime. 30 tablet 3  . valACYclovir (VALTREX) 1000 MG tablet TAKE 1 TABLET BY MOUTH 2 TIMES DAILY. 20 tablet 0  . ALPRAZolam (XANAX) 0.5 MG tablet Take 1 tablet (0.5 mg total) by mouth at bedtime as needed for anxiety. 60 tablet 0  . lisinopril-hydrochlorothiazide (PRINZIDE,ZESTORETIC) 20-12.5 MG tablet TAKE 1 TABLET BY MOUTH DAILY. 30 tablet 3  . omeprazole (PRILOSEC) 20 MG capsule TAKE 1  CAPSULE BY MOUTH DAILY. 30 capsule 2  . pravastatin (PRAVACHOL) 40 MG tablet TAKE 1 TABLET BY MOUTH DAILY. 30 tablet 3  . levocetirizine (XYZAL) 5 MG tablet Take 1 tablet (5 mg total) by mouth every evening. (Patient not taking: Reported on 07/10/2016) 30 tablet 3  . sertraline (ZOLOFT) 50 MG tablet Take 1 tablet (50 mg total) by mouth daily. For depression. (Patient not taking: Reported on 07/10/2016) 30 tablet 3   No facility-administered medications prior to visit.     ROS Review of Systems  Constitutional: Negative for activity change, appetite change and fatigue.  HENT: Negative for congestion, sinus pressure and sore throat.   Eyes: Negative for visual disturbance.  Respiratory: Negative for cough, chest tightness, shortness of breath and wheezing.   Cardiovascular: Positive for palpitations. Negative for chest pain.  Gastrointestinal: Negative for abdominal distention, abdominal pain and constipation.  Endocrine: Negative for polydipsia.  Genitourinary: Negative for dysuria and frequency.  Musculoskeletal: Negative for arthralgias and back pain.  Skin: Negative for rash.  Neurological: Positive for tremors. Negative for light-headedness and numbness.  Hematological: Does not bruise/bleed easily.  Psychiatric/Behavioral: Negative for agitation and behavioral problems.       Positive for anxiety    Objective:  BP (!) 150/104   Pulse 93   Temp 98.1 F (36.7 C) (Oral)   Ht _0  (1.676 m)   Wt 177 lb (80.3 kg)   SpO2  99%   BMI 28.57 kg/m   BP/Weight 03/31/2017 07/10/2016 7/86/7672  Systolic BP 094 709 628  Diastolic BP 366 87 294  Wt. (Lbs) 177 177.2 176  BMI 28.57 28.6 28.41  Some encounter information is confidential and restricted. Go to Review Flowsheets activity to see all data.      Physical Exam  Constitutional: She is oriented to person, place, and time. She appears well-developed and well-nourished.  Cardiovascular: Normal rate, normal heart sounds and intact  distal pulses.   No murmur heard. Pulmonary/Chest: Effort normal and breath sounds normal. She has no wheezes. She has no rales. She exhibits no tenderness.  Abdominal: Soft. Bowel sounds are normal. She exhibits no distension and no mass. There is no tenderness.  Musculoskeletal: Normal range of motion.  Neurological: She is alert and oriented to person, place, and time.  Absence of tremor on outstretched hand  Skin: Skin is warm and dry.  Psychiatric: She has a normal mood and affect.     CMP Latest Ref Rng & Units 12/05/2015 11/21/2014 05/03/2014  Glucose 65 - 99 mg/dL 233(H) 152(H) 132(H)  BUN 7 - 25 mg/dL _0 Creatinine 0.50 - 1.10 mg/dL 1.18(H) 0.94 1.00  Sodium 135 - 146 mmol/L 138 139 135  Potassium 3.5 - 5.3 mmol/L 3.6 4.1 4.6  Chloride 98 - 110 mmol/L 105 105 102  CO2 20 - 31 mmol/L _1 Calcium 8.6 - 10.2 mg/dL 9.1 8.8 9.2  Total Protein 6.0 - 8.3 g/dL - 6.4 7.3  Total Bilirubin 0.2 - 1.2 mg/dL - 0.4 0.5  Alkaline Phos 39 - 117 U/L - 57 64  AST 0 - 37 U/L - 14 21  ALT 0 - 35 U/L - 16 24    Lipid Panel     Component Value Date/Time   CHOL 182 05/03/2014 1220   TRIG 112 05/03/2014 1220   HDL 50 05/03/2014 1220   CHOLHDL 3.6 05/03/2014 1220   VLDL 22 05/03/2014 1220   LDLCALC 110 (H) 05/03/2014 1220    Assessment & Plan:   1. Essential hypertension Uncontrolled due to running out of her antihypertensives Low-sodium diet Emphasized need to be more compliant - lisinopril-hydrochlorothiazide (PRINZIDE,ZESTORETIC) 20-12.5 MG tablet; Take 1 tablet by mouth daily.  Dispense: 30 tablet; Refill: 5 - CMP14+EGFR - Lipid panel  2. Dyslipidemia Controlled - pravastatin (PRAVACHOL) 40 MG tablet; Take 1 tablet (40 mg total) by mouth daily.  Dispense: 30 tablet; Refill: 5  3. Gastroesophageal reflux disease, esophagitis presence not specified Stable - omeprazole (PRILOSEC) 20 MG capsule; Take 1 capsule (20 mg total) by mouth daily.  Dispense: 30 capsule; Refill:  5  4. Anxiety Have informed her I will be unable to write a prescription for Xanax Currently receiving treatment from [facility] I have advised her to discuss her anxiety with a psychiatrist for optimal control.    5. Occasional tremors Will need to exclude thyroid disorder This could also be secondary to anxiety - TSH   Meds ordered this encounter  Medications  . lisinopril-hydrochlorothiazide (PRINZIDE,ZESTORETIC) 20-12.5 MG tablet    Sig: Take 1 tablet by mouth daily.    Dispense:  30 tablet    Refill:  5  . pravastatin (PRAVACHOL) 40 MG tablet    Sig: Take 1 tablet (40 mg total) by mouth daily.    Dispense:  30 tablet    Refill:  5  . omeprazole (PRILOSEC) 20 MG capsule    Sig: Take 1 capsule (20  mg total) by mouth daily.    Dispense:  30 capsule    Refill:  5    Follow-up: Return in about 1 month (around 05/01/2017) for Complete physical exam.   Arnoldo Morale MD

## 2017-04-01 LAB — CMP14+EGFR
A/G RATIO: 1.7 (ref 1.2–2.2)
ALT: 51 IU/L — AB (ref 0–32)
AST: 43 IU/L — AB (ref 0–40)
Albumin: 4.5 g/dL (ref 3.5–5.5)
Alkaline Phosphatase: 66 IU/L (ref 39–117)
BILIRUBIN TOTAL: 0.5 mg/dL (ref 0.0–1.2)
BUN/Creatinine Ratio: 9 (ref 9–23)
BUN: 10 mg/dL (ref 6–24)
CALCIUM: 9.7 mg/dL (ref 8.7–10.2)
CHLORIDE: 103 mmol/L (ref 96–106)
CO2: 21 mmol/L (ref 20–29)
Creatinine, Ser: 1.07 mg/dL — ABNORMAL HIGH (ref 0.57–1.00)
GFR, EST AFRICAN AMERICAN: 72 mL/min/{1.73_m2} (ref >59)
GFR, EST NON AFRICAN AMERICAN: 62 mL/min/{1.73_m2} (ref >59)
GLUCOSE: 115 mg/dL — AB (ref 65–99)
Globulin, Total: 2.7 g/dL (ref 1.5–4.5)
POTASSIUM: 4 mmol/L (ref 3.5–5.2)
Sodium: 141 mmol/L (ref 134–144)
TOTAL PROTEIN: 7.2 g/dL (ref 6.0–8.5)

## 2017-04-01 LAB — LIPID PANEL
Chol/HDL Ratio: 3.9 ratio (ref 0.0–4.4)
Cholesterol, Total: 220 mg/dL — ABNORMAL HIGH (ref 100–199)
HDL: 56 mg/dL (ref >39)
LDL Calculated: 139 mg/dL — ABNORMAL HIGH (ref 0–99)
TRIGLYCERIDES: 125 mg/dL (ref 0–149)
VLDL Cholesterol Cal: 25 mg/dL (ref 5–40)

## 2017-04-01 LAB — TSH: TSH: 1.22 u[IU]/mL (ref 0.450–4.500)

## 2017-05-05 ENCOUNTER — Encounter: Payer: Self-pay | Admitting: Family Medicine

## 2017-05-05 MED FILL — LISINOPRIL-HCTZ 20-12.5 MG: 20-12.5 | 30 days supply | Qty: 30 | Fill #1

## 2017-05-26 MED FILL — ?OMEPRAZOLE DR 20 MG CAPSUL: 20 | 30 days supply | Qty: 30 | Fill #1

## 2017-06-21 ENCOUNTER — Encounter: Payer: Self-pay | Admitting: Family Medicine

## 2017-06-22 MED FILL — LISINOPRIL-HCTZ 20-12.5 MG: 20-12.5 | 30 days supply | Qty: 30 | Fill #2

## 2017-06-22 MED FILL — MEGESTROL 40 MG TABLET: 40 | 30 days supply | Qty: 60 | Fill #3

## 2017-06-22 MED FILL — PRAVASTATIN NA 40 MG TAB: 40 | 30 days supply | Qty: 30 | Fill #1

## 2017-08-02 MED FILL — PRAVASTATIN NA 40 MG TAB: 40 | 30 days supply | Qty: 30 | Fill #2

## 2017-08-02 MED FILL — LISINOPRIL-HCTZ 20-12.5 MG: 20-12.5 | 30 days supply | Qty: 30 | Fill #3

## 2017-10-01 ENCOUNTER — Other Ambulatory Visit: Payer: Self-pay | Admitting: Obstetrics and Gynecology

## 2017-10-01 MED FILL — ?PRAVASTATIN SODIUM 40MG TA: 40 | 30 days supply | Qty: 30 | Fill #3

## 2017-10-01 MED FILL — LISINOPRIL-HCTZ 20-12.5 MG: 20-12.5 | 30 days supply | Qty: 30 | Fill #4

## 2017-10-01 MED FILL — ?OMEPRAZOLE 20 MG CPDR: 20 | 30 days supply | Qty: 30 | Fill #2

## 2017-10-04 MED FILL — MEGESTROL 40 MG TABLET: 40 | 30 days supply | Qty: 60 | Fill #0

## 2017-11-29 MED FILL — PRAVASTATIN NA 40 MG TAB: 40 | 30 days supply | Qty: 30 | Fill #4

## 2017-11-29 MED FILL — LISINOPRIL-HCTZ 20-12.5 MG: 20-12.5 | 30 days supply | Qty: 30 | Fill #5

## 2017-11-29 MED FILL — OMEPRAZOLE DR 20 MG CAPSULE: 20 | 30 days supply | Qty: 30 | Fill #1

## 2018-01-11 ENCOUNTER — Other Ambulatory Visit: Payer: Self-pay | Admitting: Family Medicine

## 2018-01-11 DIAGNOSIS — I10 Essential (primary) hypertension: Secondary | ICD-10-CM

## 2018-01-11 MED FILL — PRAVASTATIN NA 40 MG TAB: 40 | 30 days supply | Qty: 30 | Fill #5

## 2018-01-11 MED FILL — LISINOPRIL-HCTZ 20-12.5 MG: 20-12.5 | 30 days supply | Qty: 30 | Fill #0

## 2018-01-11 MED FILL — OMEPRAZOLE DR 20 MG CAPSULE: 20 | 30 days supply | Qty: 30 | Fill #2

## 2018-01-11 MED FILL — MEGESTROL 40 MG TABLET: 40 | 30 days supply | Qty: 60 | Fill #1

## 2018-01-27 ENCOUNTER — Ambulatory Visit: Payer: Medicare Other | Attending: Family Medicine | Admitting: Physician Assistant

## 2018-01-27 ENCOUNTER — Other Ambulatory Visit: Payer: Self-pay

## 2018-01-27 VITALS — BP 123/81 | HR 90 | Temp 99.3°F | Resp 16 | Ht 66.0 in | Wt 174.0 lb

## 2018-01-27 DIAGNOSIS — I10 Essential (primary) hypertension: Secondary | ICD-10-CM | POA: Diagnosis not present

## 2018-01-27 DIAGNOSIS — G4730 Sleep apnea, unspecified: Secondary | ICD-10-CM

## 2018-01-27 DIAGNOSIS — F4310 Post-traumatic stress disorder, unspecified: Secondary | ICD-10-CM | POA: Diagnosis not present

## 2018-01-27 DIAGNOSIS — G8929 Other chronic pain: Secondary | ICD-10-CM | POA: Diagnosis not present

## 2018-01-27 DIAGNOSIS — Z91013 Allergy to seafood: Secondary | ICD-10-CM | POA: Insufficient documentation

## 2018-01-27 DIAGNOSIS — F329 Major depressive disorder, single episode, unspecified: Secondary | ICD-10-CM | POA: Diagnosis not present

## 2018-01-27 DIAGNOSIS — R531 Weakness: Secondary | ICD-10-CM | POA: Diagnosis not present

## 2018-01-27 DIAGNOSIS — M549 Dorsalgia, unspecified: Secondary | ICD-10-CM | POA: Diagnosis not present

## 2018-01-27 DIAGNOSIS — N951 Menopausal and female climacteric states: Secondary | ICD-10-CM | POA: Diagnosis not present

## 2018-01-27 DIAGNOSIS — E559 Vitamin D deficiency, unspecified: Secondary | ICD-10-CM | POA: Diagnosis not present

## 2018-01-27 DIAGNOSIS — R000 Tachycardia, unspecified: Secondary | ICD-10-CM | POA: Insufficient documentation

## 2018-01-27 DIAGNOSIS — R5383 Other fatigue: Secondary | ICD-10-CM | POA: Diagnosis not present

## 2018-01-27 DIAGNOSIS — Z79899 Other long term (current) drug therapy: Secondary | ICD-10-CM | POA: Diagnosis not present

## 2018-01-27 DIAGNOSIS — R232 Flushing: Secondary | ICD-10-CM | POA: Diagnosis not present

## 2018-01-27 LAB — POCT URINALYSIS DIPSTICK
Glucose, UA: POSITIVE — AB
Leukocytes, UA: NEGATIVE
NITRITE UA: NEGATIVE
PH UA: 5.5 (ref 5.0–8.0)
Protein, UA: POSITIVE — AB
Spec Grav, UA: 1.02 (ref 1.010–1.025)
UROBILINOGEN UA: 1 U/dL

## 2018-01-27 NOTE — Progress Notes (Signed)
Patient ID: Sabrina Ortiz, female   DOB: [birth year], 47 y.o.   MRN: [id number]     Sabrina Ortiz, is a 47 y.o. female  GEX:[id number]  [id number]:[id number]  DOB - 1970-11-04  Subjective:  Chief Complaint and HPI: Sabrina Ortiz is a 47 y.o. female here today Sunday-was approaching the podium for prayer and started shaking and feeling hot and weak and felt as though she was going to pass out.  She denies CP/SOB at the time of the event.    Hot flashes have been worse over the last couple of months.  Mom menopause late 42s.    C/o snoring and breathing irregularities in sleep.    Family history:  No FH early cardiac/stroke issues.  Mom menopause in late 40s-50s  ROS:   Constitutional:  No f/c, No night sweats, No unexplained weight loss. EENT:  No vision changes, No blurry vision, No hearing changes. No mouth, throat, or ear problems.  Respiratory: No cough, No SOB Cardiac: No CP, no palpitations GI:  No abd pain, No N/V/D. GU: No Urinary s/sx Musculoskeletal: No joint pain Neuro: No headache, no dizziness, no motor weakness.  Skin: No rash Endocrine:  No polydipsia. No polyuria.  Psych: Denies SI/HI  No problems updated.  ALLERGIES: Allergies  Allergen Reactions  . Shellfish Allergy     Swelling up throat. Eat seafood, her throat may swell up when eat 2-3x a month.     PAST MEDICAL HISTORY: Past Medical History:  Diagnosis Date  . Anxiety   . Chronic back pain   . Depression   . Hypertension   . Mental disorder   . PTSD (post-traumatic stress disorder)     MEDICATIONS AT HOME: Prior to Admission medications   Medication Sig Start Date End Date Taking? Authorizing Provider  cetirizine (ZYRTEC) 10 MG tablet Take 1 tablet (10 mg total) by mouth daily. 01/28/15  Yes Advani, Ayesha Rumpf, MD  gabapentin (NEURONTIN) 300 MG capsule Take 1 capsule (300 mg total) by mouth at bedtime. For anxiety/discomfort 01/28/15  Yes Advani, Ayesha Rumpf, MD  lisinopril-hydrochlorothiazide  (PRINZIDE,ZESTORETIC) 20-12.5 MG tablet TAKE 1 TABLET BY MOUTH DAILY. 01/11/18  Yes Newlin, Odette Horns, MD  megestrol (MEGACE) 40 MG tablet TAKE 1 TABLET BY MOUTH TWICE DAILY. 10/02/17  Yes Constant, Peggy, MD  omeprazole (PRILOSEC) 20 MG capsule Take 1 capsule (20 mg total) by mouth daily. 03/31/17  Yes Hoy Register, MD  pravastatin (PRAVACHOL) 40 MG tablet Take 1 tablet (40 mg total) by mouth daily. 03/31/17  Yes Newlin, Odette Horns, MD  valACYclovir (VALTREX) 1000 MG tablet TAKE 1 TABLET BY MOUTH 2 TIMES DAILY. 02/22/17  Yes Hoy Register, MD  levocetirizine (XYZAL) 5 MG tablet Take 1 tablet (5 mg total) by mouth every evening. Patient not taking: Reported on 07/10/2016 01/28/15   Doris Cheadle, MD  traZODone (DESYREL) 50 MG tablet Take 1 tablet (50 mg total) by mouth at bedtime. Patient not taking: Reported on 01/27/2018 01/28/15   Doris Cheadle, MD     Objective:  EXAM:   Vitals:   01/27/18 1537  BP: 123/81  Pulse: 90  Resp: 16  Temp: 99.3 F (37.4 C)  TempSrc: Oral  SpO2: 98%  Weight: 174 lb (78.9 kg)  Height:  (1.676 m)    General appearance : A&OX3. NAD. Non-toxic-appearing HEENT: Atraumatic and Normocephalic.  PERRLA. EOM intact.  TM clear B. Mouth-MMM, post pharynx WNL w/o erythema, No PND.  mallampati 3 Neck: supple, no JVD. No cervical lymphadenopathy. No thyromegaly Chest/Lungs:  Breathing-non-labored,  Good air entry bilaterally, breath sounds normal without rales, rhonchi, or wheezing  CVS: S1 S2 regular, no murmurs, gallops, rubs  Extremities: Bilateral Lower Ext shows no edema, both legs are warm to touch with = pulse throughout Neurology:  CN II-XII grossly intact, Non focal.   Psych:  TP linear. J/I WNL. Normal speech. Appropriate eye contact and affect.  Skin:  No Rash  Data Review Lab Results  Component Value Date   HGBA1C 7.1 (H) 12/05/2015   HGBA1C 6.1 06/15/2013     Assessment & Plan   1. Weakness Likely due to dehydration - TSH - Comprehensive  metabolic panel - EKG 12-Lead-ekg with sinus tachycardia and no specific ST changes.  CVD risk stratification is low.   - Urinalysis Dipstick Call 911 if any CP/SOB  2. Fatigue, unspecified type - TSH - Vitamin D, 25-hydroxy - Comprehensive metabolic panel  3. Hot flashes - TSH - FSH/LH  4. Vitamin D deficiency - Vitamin D, 25-hydroxy  5. Sleep apnea, unspecified type mallampati 3 - Split night study; Future  6. Sinus tachycardia Likely due to dehydration.  Hydration imperative.  Call 911 if CP/SOB develops.  She says she felt anxious during the EKG, rate returned to 90 after EKG.     Patient have been counseled extensively about nutrition and exercise  Return in about 1 month (around 02/27/2018) for newlin; f/up sleep study/hot flashes.  The patient was given clear instructions to go to ER or return to medical center if symptoms don't improve, worsen or new problems develop. The patient verbalized understanding. The patient was told to call to get lab results if they haven't heard anything in the next week.     Georgian Co, PA-C [facility] [place], [place] [phone]   01/27/2018, 5:12 PM

## 2018-01-27 NOTE — Progress Notes (Signed)
pctPt. Is here for hot flashes, sore throat, and almost faint. Pt. Stated she is allergic to seafood with swelling on her throat.

## 2018-01-27 NOTE — Patient Instructions (Signed)
Call 911 if symptoms return or you have chest pain.  Drink 80-100ounces of water daily as you are dehydrated.   Dehydration, Adult Dehydration is when there is not enough fluid or water in your body. This happens when you lose more fluids than you take in. Dehydration can range from mild to very bad. It should be treated right away to keep it from getting very bad. Symptoms of mild dehydration may include:  Thirst.  Dry lips.  Slightly dry mouth.  Dry, warm skin.  Dizziness. Symptoms of moderate dehydration may include:  Very dry mouth.  Muscle cramps.  Dark pee (urine). Pee may be the color of tea.  Your body making less pee.  Your eyes making fewer tears.  Heartbeat that is uneven or faster than normal (palpitations).  Headache.  Light-headedness, especially when you stand up from sitting.  Fainting (syncope). Symptoms of very bad dehydration may include:  Changes in skin, such as: ? Cold and clammy skin. ? Blotchy (mottled) or pale skin. ? Skin that does not quickly return to normal after being lightly pinched and let go (poor skin turgor).  Changes in body fluids, such as: ? Feeling very thirsty. ? Your eyes making fewer tears. ? Not sweating when body temperature is high, such as in hot weather. ? Your body making very little pee.  Changes in vital signs, such as: ? Weak pulse. ? Pulse that is more than 100 beats a minute when you are sitting still. ? Fast breathing. ? Low blood pressure.  Other changes, such as: ? Sunken eyes. ? Cold hands and feet. ? Confusion. ? Lack of energy (lethargy). ? Trouble waking up from sleep. ? Short-term weight loss. ? Unconsciousness. Follow these instructions at home:  If told by your doctor, drink an ORS: ? Make an ORS by using instructions on the package. ? Start by drinking small amounts, about  cup (120 mL) every 5-10 minutes. ? Slowly drink more until you have had the amount that your doctor said to  have.  Drink enough clear fluid to keep your pee clear or pale yellow. If you were told to drink an ORS, finish the ORS first, then start slowly drinking clear fluids. Drink fluids such as: ? Water. Do not drink only water by itself. Doing that can make the salt (sodium) level in your body get too low (hyponatremia). ? Ice chips. ? Fruit juice that you have added water to (diluted). ? Low-calorie sports drinks.  Avoid: ? Alcohol. ? Drinks that have a lot of sugar. These include high-calorie sports drinks, fruit juice that does not have water added, and soda. ? Caffeine. ? Foods that are greasy or have a lot of fat or sugar.  Take over-the-counter and prescription medicines only as told by your doctor.  Do not take salt tablets. Doing that can make the salt level in your body get too high (hypernatremia).  Eat foods that have minerals (electrolytes). Examples include bananas, oranges, potatoes, tomatoes, and spinach.  Keep all follow-up visits as told by your doctor. This is important. Contact a doctor if:  You have belly (abdominal) pain that: ? Gets worse. ? Stays in one area (localizes).  You have a rash.  You have a stiff neck.  You get angry or annoyed more easily than normal (irritability).  You are more sleepy than normal.  You have a harder time waking up than normal.  You feel: ? Weak. ? Dizzy. ? Very thirsty.  You have peed (  urinated) only a small amount of very dark pee during 6-8 hours. Get help right away if:  You have symptoms of very bad dehydration.  You cannot drink fluids without throwing up (vomiting).  Your symptoms get worse with treatment.  You have a fever.  You have a very bad headache.  You are throwing up or having watery poop (diarrhea) and it: ? Gets worse. ? Does not go away.  You have blood or something green (bile) in your throw-up.  You have blood in your poop (stool). This may cause poop to look black and tarry.  You have  not peed in 6-8 hours.  You pass out (faint).  Your heart rate when you are sitting still is more than 100 beats a minute.  You have trouble breathing. This information is not intended to replace advice given to you by your health care provider. Make sure you discuss any questions you have with your health care provider. Document Released: 06/20/2009 Document Revised: 03/13/2016 Document Reviewed: 10/18/2015 Elsevier Interactive Patient Education  2018 [employer].

## 2018-01-28 LAB — COMPREHENSIVE METABOLIC PANEL
A/G RATIO: 1.7 (ref 1.2–2.2)
ALT: 55 IU/L — AB (ref 0–32)
AST: 40 IU/L (ref 0–40)
Albumin: 4.5 g/dL (ref 3.5–5.5)
Alkaline Phosphatase: 65 IU/L (ref 39–117)
BUN/Creatinine Ratio: 11 (ref 9–23)
BUN: 13 mg/dL (ref 6–24)
Bilirubin Total: 0.5 mg/dL (ref 0.0–1.2)
CALCIUM: 9.3 mg/dL (ref 8.7–10.2)
CO2: 21 mmol/L (ref 20–29)
CREATININE: 1.2 mg/dL — AB (ref 0.57–1.00)
Chloride: 105 mmol/L (ref 96–106)
GFR, EST AFRICAN AMERICAN: 63 mL/min/{1.73_m2} (ref >59)
GFR, EST NON AFRICAN AMERICAN: 54 mL/min/{1.73_m2} — AB (ref >59)
Globulin, Total: 2.7 g/dL (ref 1.5–4.5)
Glucose: 209 mg/dL — ABNORMAL HIGH (ref 65–99)
Potassium: 3.6 mmol/L (ref 3.5–5.2)
Sodium: 142 mmol/L (ref 134–144)
TOTAL PROTEIN: 7.2 g/dL (ref 6.0–8.5)

## 2018-01-28 LAB — FSH/LH
FSH: 5.7 m[IU]/mL
LH: 2.7 m[IU]/mL

## 2018-01-28 LAB — TSH: TSH: 1.98 u[IU]/mL (ref 0.450–4.500)

## 2018-01-28 LAB — VITAMIN D 25 HYDROXY (VIT D DEFICIENCY, FRACTURES): VIT D 25 HYDROXY: 10.8 ng/mL — AB (ref 30.0–100.0)

## 2018-01-31 ENCOUNTER — Other Ambulatory Visit: Payer: Self-pay | Admitting: Physician Assistant

## 2018-01-31 MED ORDER — VITAMIN D (ERGOCALCIFEROL) 1.25 MG (50000 UNIT) PO CAPS: 50000.0000 [IU] | ORAL_CAPSULE | ORAL | 0 refills | Status: DC

## 2018-02-01 ENCOUNTER — Telehealth: Payer: Self-pay

## 2018-02-01 NOTE — Telephone Encounter (Signed)
CMA called patient to inform on lab results.  Patient understood. Patient verified DOB.  

## 2018-02-01 NOTE — Telephone Encounter (Signed)
-----   Message from Anders Simmonds, [name] sent at 01/31/2018  4:35 PM EDT ----- Your vitamin D is very low.  This can contribute to muscle aches, anxiety, fatigue, and depression.  I have sent a prescription to the pharmacy for you to take once a week.  We will recheck this level in 3-4 months.  Your blood sugar was high and you may have diabetes.  Cut back on sugar and schedule an appt so we can check some other blood work within the next week or 2.  Your thyroid studies were normal. Your hormones do not appear menopausal at this time.  Drink more water.  Cut down on any alcohol or tylenol products because your liver function is a little elevated. Thanks, Georgian Co, PA-C

## 2018-02-22 ENCOUNTER — Ambulatory Visit: Payer: Medicare Other | Attending: Family Medicine | Admitting: Licensed Clinical Social Worker

## 2018-02-22 ENCOUNTER — Encounter: Payer: Self-pay | Admitting: Family Medicine

## 2018-02-22 ENCOUNTER — Ambulatory Visit: Payer: Medicare Other | Attending: Family Medicine | Admitting: Family Medicine

## 2018-02-22 VITALS — BP 139/92 | HR 89 | Temp 98.2°F | Ht 66.0 in | Wt 173.6 lb

## 2018-02-22 DIAGNOSIS — F329 Major depressive disorder, single episode, unspecified: Secondary | ICD-10-CM | POA: Insufficient documentation

## 2018-02-22 DIAGNOSIS — F4310 Post-traumatic stress disorder, unspecified: Secondary | ICD-10-CM | POA: Insufficient documentation

## 2018-02-22 DIAGNOSIS — I10 Essential (primary) hypertension: Secondary | ICD-10-CM | POA: Insufficient documentation

## 2018-02-22 DIAGNOSIS — G8929 Other chronic pain: Secondary | ICD-10-CM | POA: Diagnosis not present

## 2018-02-22 DIAGNOSIS — K219 Gastro-esophageal reflux disease without esophagitis: Secondary | ICD-10-CM | POA: Diagnosis not present

## 2018-02-22 DIAGNOSIS — R232 Flushing: Secondary | ICD-10-CM | POA: Diagnosis not present

## 2018-02-22 DIAGNOSIS — F411 Generalized anxiety disorder: Secondary | ICD-10-CM | POA: Insufficient documentation

## 2018-02-22 DIAGNOSIS — R21 Rash and other nonspecific skin eruption: Secondary | ICD-10-CM | POA: Diagnosis not present

## 2018-02-22 DIAGNOSIS — M549 Dorsalgia, unspecified: Secondary | ICD-10-CM | POA: Insufficient documentation

## 2018-02-22 DIAGNOSIS — E559 Vitamin D deficiency, unspecified: Secondary | ICD-10-CM | POA: Insufficient documentation

## 2018-02-22 DIAGNOSIS — Z79899 Other long term (current) drug therapy: Secondary | ICD-10-CM | POA: Diagnosis not present

## 2018-02-22 DIAGNOSIS — Z91018 Allergy to other foods: Secondary | ICD-10-CM | POA: Insufficient documentation

## 2018-02-22 DIAGNOSIS — N951 Menopausal and female climacteric states: Secondary | ICD-10-CM | POA: Diagnosis not present

## 2018-02-22 DIAGNOSIS — E785 Hyperlipidemia, unspecified: Secondary | ICD-10-CM

## 2018-02-22 DIAGNOSIS — F419 Anxiety disorder, unspecified: Secondary | ICD-10-CM

## 2018-02-22 MED ORDER — LISINOPRIL-HYDROCHLOROTHIAZIDE 20-12.5 MG PO TABS: 1.0000 | ORAL_TABLET | Freq: Every day | ORAL | 5 refills | Status: DC

## 2018-02-22 MED ORDER — CLONIDINE HCL 0.1 MG PO TABS
0.1000 mg | ORAL_TABLET | Freq: Every day | ORAL | 3 refills | Status: DC
Start: 2018-02-22 — End: 2018-04-14

## 2018-02-22 MED ORDER — HYDROXYZINE HCL 25 MG PO TABS: 25.0000 mg | ORAL_TABLET | Freq: Three times a day (TID) | ORAL | 0 refills | Status: DC | PRN

## 2018-02-22 MED ORDER — PRAVASTATIN SODIUM 40 MG PO TABS: 40.0000 mg | ORAL_TABLET | Freq: Every day | ORAL | 5 refills | Status: DC

## 2018-02-22 MED ORDER — CETIRIZINE HCL 10 MG PO TABS: 10.0000 mg | ORAL_TABLET | Freq: Every day | ORAL | 3 refills | Status: DC

## 2018-02-22 MED ORDER — OMEPRAZOLE 20 MG PO CPDR: 20.0000 mg | DELAYED_RELEASE_CAPSULE | Freq: Every day | ORAL | 5 refills | Status: DC

## 2018-02-22 MED ORDER — EPINEPHRINE 0.3 MG/0.3ML IJ SOAJ: 0.3000 mg | Freq: Once | INTRAMUSCULAR | 1 refills | Status: AC

## 2018-02-22 MED ORDER — CARVEDILOL 6.25 MG PO TABS: 6.2500 mg | ORAL_TABLET | Freq: Two times a day (BID) | ORAL | 3 refills | Status: DC

## 2018-02-22 MED FILL — LISINOPRIL-HCTZ 20-12.5 MG: 20-12.5 | 30 days supply | Qty: 30 | Fill #0

## 2018-02-22 MED FILL — hydrOXYzine HCL 25 MG TABS: 25 | 30 days supply | Qty: 90 | Fill #0

## 2018-02-22 MED FILL — cloNIDine HCL 0.1 MG TABS: 0.1 | 30 days supply | Qty: 30 | Fill #0

## 2018-02-22 MED FILL — OMEPRAZOLE 20 MG CAP: 20 | 30 days supply | Qty: 30 | Fill #0

## 2018-02-22 MED FILL — PRAVASTATIN NA 40 MG TAB: 40 | 30 days supply | Qty: 30 | Fill #0

## 2018-02-22 NOTE — Progress Notes (Signed)
Subjective:  Patient ID: Sabrina Ortiz, female    DOB: Dec 28, 1970  Age: 47 y.o. MRN: [id number]  CC: Anxiety and Hypertension   HPI Sabrina Ortiz is a 47 year old female with a history of hypertension, hyperlipidemia, GERD, anxiety and depression who presents today for a follow-up visit. She complains of a history of anxiety attacks and this is more around crowds with symptoms starting with her legs being tremulous and then her whole body.  She also has performance anxiety.  Seen at [facility] the past but has not been there lately and symptoms occur about once every week.  Denies suicidal ideations or intents. She has a history of allergy to shellfish however she has noticed that she has had laryngeal edema and feeling of her throat closing even when she has eaten foods without shrimp and sometimes wakes up at night gagging and we shortness of breath.  She denies breaking out in hives but endorses a history of pruritus. Doing well on her antihypertensive and her statin and denies adverse effects from her medications. Her reflux symptoms are controlled.  Past Medical History:  Diagnosis Date  . Anxiety   . Chronic back pain   . Depression   . Hypertension   . Mental disorder   . PTSD (post-traumatic stress disorder)     No past surgical history on file.  Allergies  Allergen Reactions  . Shellfish Allergy     Swelling up throat. Eat seafood, her throat may swell up when eat 2-3x a month.      Outpatient Medications Prior to Visit  Medication Sig Dispense Refill  . gabapentin (NEURONTIN) 300 MG capsule Take 1 capsule (300 mg total) by mouth at bedtime. For anxiety/discomfort 30 capsule 3  . levocetirizine (XYZAL) 5 MG tablet Take 1 tablet (5 mg total) by mouth every evening. 30 tablet 3  . megestrol (MEGACE) 40 MG tablet TAKE 1 TABLET BY MOUTH TWICE DAILY. 60 tablet 3  . traZODone (DESYREL) 50 MG tablet Take 1 tablet (50 mg total) by mouth at bedtime. 30 tablet 3  .  valACYclovir (VALTREX) 1000 MG tablet TAKE 1 TABLET BY MOUTH 2 TIMES DAILY. 20 tablet 0  . Vitamin D, Ergocalciferol, (DRISDOL) 50000 units CAPS capsule Take 1 capsule (50,000 Units total) by mouth every 7 (seven) days. 16 capsule 0  . cetirizine (ZYRTEC) 10 MG tablet Take 1 tablet (10 mg total) by mouth daily. 30 tablet 3  . lisinopril-hydrochlorothiazide (PRINZIDE,ZESTORETIC) 20-12.5 MG tablet TAKE 1 TABLET BY MOUTH DAILY. 30 tablet 0  . omeprazole (PRILOSEC) 20 MG capsule Take 1 capsule (20 mg total) by mouth daily. 30 capsule 5  . pravastatin (PRAVACHOL) 40 MG tablet Take 1 tablet (40 mg total) by mouth daily. 30 tablet 5   No facility-administered medications prior to visit.     ROS Review of Systems  Constitutional: Negative for activity change, appetite change and fatigue.  HENT: Negative for congestion, sinus pressure and sore throat.   Eyes: Negative for visual disturbance.  Respiratory: Negative for cough, chest tightness, shortness of breath and wheezing.   Cardiovascular: Negative for chest pain and palpitations.  Gastrointestinal: Negative for abdominal distention, abdominal pain and constipation.  Endocrine: Negative for polydipsia.  Genitourinary: Negative for dysuria and frequency.  Musculoskeletal: Negative for arthralgias and back pain.  Skin: Negative for rash.  Neurological: Negative for tremors, light-headedness and numbness.  Hematological: Does not bruise/bleed easily.  Psychiatric/Behavioral: Negative for agitation and behavioral problems.  Positive for anxiety    Objective:  BP (!) 139/92   Pulse 89   Temp 98.2 F (36.8 C) (Oral)   Ht 5\' 6"  (1.676 m)   Wt 173 lb 9.6 oz (78.7 kg)   SpO2 98%   BMI 28.02 kg/m   BP/Weight 02/22/2018 01/27/2018 03/31/2017  Systolic BP 139 123 150  Diastolic BP 92 81 104  Wt. (Lbs) 173.6 174 177  BMI 28.02 28.08 28.57  Some encounter information is confidential and restricted. Go to Review Flowsheets activity to see  all data.      Physical Exam  Constitutional: She is oriented to person, place, and time. She appears well-developed and well-nourished.  Cardiovascular: Normal rate, normal heart sounds and intact distal pulses.  No murmur heard. Pulmonary/Chest: Effort normal and breath sounds normal. She has no wheezes. She has no rales. She exhibits no tenderness.  Abdominal: Soft. Bowel sounds are normal. She exhibits no distension and no mass. There is no tenderness.  Musculoskeletal: Normal range of motion.  Neurological: She is alert and oriented to person, place, and time.  Skin: Skin is warm and dry.  Psychiatric: She has a normal mood and affect.    Depression screen [facility] 2/9 02/22/2018 01/27/2018 07/10/2016 12/05/2015  Decreased Interest 2 2 2  0  Down, Depressed, Hopeless 2 1 2  0  PHQ - 2 Score 4 3 4  0  Altered sleeping 3 3 3  -  Tired, decreased energy 2 3 2  -  Change in appetite 1 2 2  -  Feeling bad or failure about yourself  1 - 0 -  Trouble concentrating 1 2 1  -  Moving slowly or fidgety/restless 0 1 0 -  Suicidal thoughts 0 0 0 -  PHQ-9 Score 12 14 12  -    GAD 7 : Generalized Anxiety Score 02/22/2018 01/27/2018 07/10/2016  Nervous, Anxious, on Edge 2 3 3   Control/stop worrying 1 1 3   Worry too much - different things 1 1 2   Trouble relaxing 2 2 2   Restless 1 1 1   Easily annoyed or irritable 3 2 2   Afraid - awful might happen 0 1 0  Total GAD 7 Score 10 11 13      Assessment & Plan:   1. Vitamin D deficiency Currently on Drisdol  2. Generalized anxiety disorder Would love to commence back so I will however she indicates she would like to be followed at [facility] so I have commenced hydroxyzine in the meantime. PHQ 9 score of 12 and GAD 7 score of 10 LCSW called in for counseling - hydrOXYzine (ATARAX/VISTARIL) 25 MG tablet; Take 1 tablet (25 mg total) by mouth 3 (three) times daily as needed.  Dispense: 90 tablet; Refill: 0  3. Rash and nonspecific skin eruption - cetirizine  (ZYRTEC) 10 MG tablet; Take 1 tablet (10 mg total) by mouth daily.  Dispense: 30 tablet; Refill: 3  4. Essential hypertension Controlled Counseled on blood pressure goal of less than 130/80, low-sodium, DASH diet, medication compliance, 150 minutes of moderate intensity exercise per week. Discussed medication compliance, adverse effects. - lisinopril-hydrochlorothiazide (PRINZIDE,ZESTORETIC) 20-12.5 MG tablet; Take 1 tablet by mouth daily.  Dispense: 30 tablet; Refill: 5  5. Gastroesophageal reflux disease, esophagitis presence not specified Controlled - omeprazole (PRILOSEC) 20 MG capsule; Take 1 capsule (20 mg total) by mouth daily.  Dispense: 30 capsule; Refill: 5  6. Dyslipidemia Stable Low-cholesterol diet - pravastatin (PRAVACHOL) 40 MG tablet; Take 1 tablet (40 mg total) by mouth daily.  Dispense: 30 tablet;  Refill: 5  7. Allergy to food She likely has multiple allergies besides shellfish allergy - EPINEPHrine (EPIPEN 2-PAK) 0.3 mg/0.3 mL IJ SOAJ injection; Inject 0.3 mLs (0.3 mg total) into the muscle once for 1 dose.  Dispense: 1 Device; Refill: 1 - Ambulatory referral to Allergy - Allergy Panel 19, Seafood Group - Allergy Panel 16, Vegetable Group - Allergy Panel 15, Cereal Group - Allergy Panel 18, Nut Mix Group - Food Allergy Profile  8. Hot flashes Commenced on clonidine - cloNIDine (CATAPRES) 0.1 MG tablet; Take 1 tablet (0.1 mg total) by mouth at bedtime.  Dispense: 30 tablet; Refill: 3   Addendum: I gave the patient a call on the phone and advised her to discontinue her ACE inhibitor as she could be having angioedema with lisinopril.  Prescription for carvedilol has been sent to the pharmacy instead.  We will hold off on allergy referral until her next visit to evaluate for improvement in symptoms.  Meds ordered this encounter  Medications  . cloNIDine (CATAPRES) 0.1 MG tablet    Sig: Take 1 tablet (0.1 mg total) by mouth at bedtime.    Dispense:  30 tablet     Refill:  3  . hydrOXYzine (ATARAX/VISTARIL) 25 MG tablet    Sig: Take 1 tablet (25 mg total) by mouth 3 (three) times daily as needed.    Dispense:  90 tablet    Refill:  0  . cetirizine (ZYRTEC) 10 MG tablet    Sig: Take 1 tablet (10 mg total) by mouth daily.    Dispense:  30 tablet    Refill:  3  . lisinopril-hydrochlorothiazide (PRINZIDE,ZESTORETIC) 20-12.5 MG tablet    Sig: Take 1 tablet by mouth daily.    Dispense:  30 tablet    Refill:  5  . omeprazole (PRILOSEC) 20 MG capsule    Sig: Take 1 capsule (20 mg total) by mouth daily.    Dispense:  30 capsule    Refill:  5  . pravastatin (PRAVACHOL) 40 MG tablet    Sig: Take 1 tablet (40 mg total) by mouth daily.    Dispense:  30 tablet    Refill:  5  . EPINEPHrine (EPIPEN 2-PAK) 0.3 mg/0.3 mL IJ SOAJ injection    Sig: Inject 0.3 mLs (0.3 mg total) into the muscle once for 1 dose.    Dispense:  1 Device    Refill:  1    Follow-up: Return in about 1 month (around 03/24/2018) for follow up on Hot flashes and anxiety.   Hoy Register MD

## 2018-02-22 NOTE — BH Specialist Note (Signed)
Integrated Behavioral Health Initial Visit  MRN: [id number] Name: Lorin GlassJerry L Kuenzi  Number of Integrated Behavioral Health Clinician visits:: 1/6 Session Start time: 3:15 PM  Session End time: 3:45 PM Total time: 30 minutes  Type of Service: Integrated Behavioral Health- Individual/Family Interpretor:No. Interpretor Name and Language: N.A   Warm Hand Off Completed.       SUBJECTIVE: Lorin GlassJerry L Neitzke is a 47 y.o. female accompanied by self Patient was referred by Dr. Alvis LemmingsNewlin for depression and anxiety. Patient reports the following symptoms/concerns: decreased interest in doing things, hx of trauma, feelings of sadness and worry, difficulty sleeping, irritability, panic attacks, and difficulty relaxing Duration of problem: 2009; Severity of problem: moderate  OBJECTIVE: Mood: Anxious and Affect: Appropriate Risk of harm to self or others: No plan to harm self or others  LIFE CONTEXT: Family and Social: Pt receives support from family (2 sisters reside nearby) and community organization ([employer]) School/Work: Pt did not report concerns Self-Care: Pt participates in medication management Life Changes: Pt has ongoing medical conditions and reports having an increase in her anxiety resulting in panic attacks.   GOALS ADDRESSED: Patient will: 1. Reduce symptoms of: anxiety and depression 2. Increase knowledge and/or ability of: coping skills  3. Demonstrate ability to: Increase adequate support systems for patient/family  INTERVENTIONS: Interventions utilized: Mindfulness or Management consultantelaxation Training, Supportive Counseling, Psychoeducation and/or Health Education and Link to [employer]  Standardized Assessments completed: GAD-7 and PHQ 2&9  ASSESSMENT: Patient currently experiencing depression and anxiety. She reports decreased interest in doing things, hx of trauma, feelings of sadness and worry, difficulty sleeping, irritability, panic attacks, and difficulty relaxing. Pt  received [facility] and [facility] approximately one year ago. She plans on reinitiating services due to an increase in anxiety. Pt receives additional support from friends and family. She denies SI/HI/AVH.    Patient may benefit from psychoeducation and psychotherapy. LCSWA educated pt on correlation between one's physical and mental health. LCSWA introduced grounding interventions for pt to utilize prior and/or during panic attacks to assist in decreasing symptoms. She has agreed to participate in medication management through PCP until she is able to re-initiate behavioral health services [facility]. Additional resources were provided for crisis intervention, psychotherapy, and medication management.   PLAN: 1. Follow up with behavioral health clinician on : Pt was encouraged to contact LCSWA if symptoms worsen or fail to improve to schedule behavioral appointments at [facility]. 2. Behavioral recommendations: LCSWA recommends that pt apply healthy coping skills discussed, comply with medication management, utilize provided resources, and re-initiate behavioral health services with community agency. Pt is encouraged to schedule follow up appointment with LCSWA 3. Referral(s): Integrated Art gallery managerBehavioral Health Services (In Clinic) and Community Mental Health Services (LME/Outside Clinic) 4. "From scale of 1-10, how likely are you to follow plan?": 9/10  Bridgett LarssonJasmine D Kailena Lubas, LCSW 02/23/18 5:16 PM

## 2018-02-22 NOTE — Patient Instructions (Signed)
Menopause Menopause is the normal time of life when menstrual periods stop completely. Menopause is complete when you have missed 12 consecutive menstrual periods. It usually occurs between the ages of 48 years and 55 years. Very rarely does a woman develop menopause before the age of 40 years. At menopause, your ovaries stop producing the female hormones estrogen and progesterone. This can cause undesirable symptoms and also affect your health. Sometimes the symptoms may occur 4-5 years before the menopause begins. There is no relationship between menopause and:  Oral contraceptives.  Number of children you had.  Race.  The age your menstrual periods started (menarche).  Heavy smokers and very thin women may develop menopause earlier in life. What are the causes?  The ovaries stop producing the female hormones estrogen and progesterone. Other causes include:  Surgery to remove both ovaries.  The ovaries stop functioning for no known reason.  Tumors of the pituitary gland in the brain.  Medical disease that affects the ovaries and hormone production.  Radiation treatment to the abdomen or pelvis.  Chemotherapy that affects the ovaries.  What are the signs or symptoms?  Hot flashes.  Night sweats.  Decrease in sex drive.  Vaginal dryness and thinning of the vagina causing painful intercourse.  Dryness of the skin and developing wrinkles.  Headaches.  Tiredness.  Irritability.  Memory problems.  Weight gain.  Bladder infections.  Hair growth of the face and chest.  Infertility. More serious symptoms include:  Loss of bone (osteoporosis) causing breaks (fractures).  Depression.  Hardening and narrowing of the arteries (atherosclerosis) causing heart attacks and strokes.  How is this diagnosed?  When the menstrual periods have stopped for 12 straight months.  Physical exam.  Hormone studies of the blood. How is this treated? There are many treatment  choices and nearly as many questions about them. The decisions to treat or not to treat menopausal changes is an individual choice made with your health care provider. Your health care provider can discuss the treatments with you. Together, you can decide which treatment will work best for you. Your treatment choices may include:  Hormone therapy (estrogen and progesterone).  Non-hormonal medicines.  Treating the individual symptoms with medicine (for example antidepressants for depression).  Herbal medicines that may help specific symptoms.  Counseling by a psychiatrist or psychologist.  Group therapy.  Lifestyle changes including: ? Eating healthy. ? Regular exercise. ? Limiting caffeine and alcohol. ? Stress management and meditation.  No treatment.  Follow these instructions at home:  Take the medicine your health care provider gives you as directed.  Get plenty of sleep and rest.  Exercise regularly.  Eat a diet that contains calcium (good for the bones) and soy products (acts like estrogen hormone).  Avoid alcoholic beverages.  Do not smoke.  If you have hot flashes, dress in layers.  Take supplements, calcium, and vitamin D to strengthen bones.  You can use over-the-counter lubricants or moisturizers for vaginal dryness.  Group therapy is sometimes very helpful.  Acupuncture may be helpful in some cases. Contact a health care provider if:  You are not sure you are in menopause.  You are having menopausal symptoms and need advice and treatment.  You are still having menstrual periods after age 55 years.  You have pain with intercourse.  Menopause is complete (no menstrual period for 12 months) and you develop vaginal bleeding.  You need a referral to a specialist (gynecologist, psychiatrist, or psychologist) for treatment. Get help right   away if:  You have severe depression.  You have excessive vaginal bleeding.  You fell and think you have a  broken bone.  You have pain when you urinate.  You develop leg or chest pain.  You have a fast pounding heart beat (palpitations).  You have severe headaches.  You develop vision problems.  You feel a lump in your breast.  You have abdominal pain or severe indigestion. This information is not intended to replace advice given to you by your health care provider. Make sure you discuss any questions you have with your health care provider. Document Released: 11/14/2003 Document Revised: 01/30/2016 Document Reviewed: 03/23/2013 Elsevier Interactive Patient Education  2017 [employer]  

## 2018-02-26 LAB — ALLERGY PANEL 18, NUT MIX GROUP
Allergen Coconut IgE: <0.1 kU/L
F020-IgE Almond: <0.1 kU/L
F202-IgE Cashew Nut: <0.1 kU/L
Hazelnut (Filbert) IgE: <0.1 kU/L
Pecan Nut IgE: <0.1 kU/L

## 2018-02-26 LAB — ALLERGEN PROFILE, VEGETABLE I
Allergen Broccoli: <0.1 kU/L
Allergen Cabbage IgE: <0.1 kU/L
Allergen Cauliflower IgE: <0.1 kU/L
Allergen Celery IgE: <0.1 kU/L
Allergen Lettuce IgE: <0.1 kU/L
F214-IgE Spinach: <0.1 kU/L

## 2018-02-26 LAB — FOOD ALLERGY PROFILE
Allergen Corn, IgE: <0.1 kU/L
Clam IgE: <0.1 kU/L
Egg White IgE: <0.1 kU/L

## 2018-02-26 LAB — ALLERGEN PROFILE, VEGETABLE II
Allergen Green Bean IgE: <0.1 kU/L
Allergen Green Pea IgE: <0.1 kU/L
Allergen Potato, White IgE: <0.1 kU/L
Allergen Tomato, IgE: <0.1 kU/L
Pumpkin IgE: <0.1 kU/L
Soybean IgE: <0.1 kU/L

## 2018-02-26 LAB — ALLERGY PANEL 19, SEAFOOD GROUP
F023-IgE Crab: <0.1 kU/L
Shrimp IgE: 0.21 kU/L — AB
Tuna: <0.1 kU/L

## 2018-02-27 ENCOUNTER — Ambulatory Visit (HOSPITAL_BASED_OUTPATIENT_CLINIC_OR_DEPARTMENT_OTHER): Payer: Medicare Other | Attending: Physician Assistant | Admitting: Internal Medicine

## 2018-02-27 VITALS — Ht 66.0 in | Wt 174.0 lb

## 2018-02-27 DIAGNOSIS — G4730 Sleep apnea, unspecified: Secondary | ICD-10-CM | POA: Diagnosis present

## 2018-02-27 DIAGNOSIS — G4733 Obstructive sleep apnea (adult) (pediatric): Secondary | ICD-10-CM | POA: Insufficient documentation

## 2018-02-27 DIAGNOSIS — G4700 Insomnia, unspecified: Secondary | ICD-10-CM | POA: Diagnosis not present

## 2018-02-28 ENCOUNTER — Telehealth: Payer: Self-pay

## 2018-02-28 NOTE — Telephone Encounter (Signed)
Patient called and I informed her that her allergy panel is only positive for shrimp allergy.

## 2018-02-28 NOTE — Telephone Encounter (Signed)
Patient was called and a voicemail was left informing patient to contact office for lab results.   If patient returns phone call please inform patient of lab results below.

## 2018-02-28 NOTE — Telephone Encounter (Signed)
-----   Message from Hoy RegisterEnobong Newlin, MD sent at 02/26/2018 11:28 PM EDT ----- Allergy panel is only positive for shrimp allergy.

## 2018-03-10 DIAGNOSIS — G4730 Sleep apnea, unspecified: Secondary | ICD-10-CM

## 2018-03-10 NOTE — Procedures (Signed)
    Patient Name: Sabrina Ortiz, Angelina Study Date: 02/27/2018 Gender: Female D.O.B: June 04, 1971 Age (years): 46 Referring Provider: Anders SimmondsAngela M McClung PA-C Height (inches): 66 Interpreting Physician: Jetty Duhamellinton Young MD, ABSM Weight (lbs): 174 RPSGT: Sabrina ParrDubili, Fred BMI: 28 MRN: [id number] Neck Size: 15.00  CLINICAL INFORMATION Sleep Study Type: NPSG Indication for sleep study: Fatigue, Hypertension, Snoring, Witnesses Apnea / Gasping During Sleep  Epworth Sleepiness Score: 10  SLEEP STUDY TECHNIQUE As per the AASM Manual for the Scoring of Sleep and Associated Events v2.3 (April 2016) with a hypopnea requiring 4% desaturations.  The channels recorded and monitored were frontal, central and occipital EEG, electrooculogram (EOG), submentalis EMG (chin), nasal and oral airflow, thoracic and abdominal wall motion, anterior tibialis EMG, snore microphone, electrocardiogram, and pulse oximetry.  MEDICATIONS Medications self-administered by patient taken the night of the study : HYDROXYZINE  SLEEP ARCHITECTURE The study was initiated at 11:05:52 PM and ended at 5:37:49 AM.  Sleep onset time was 82.4 minutes and the sleep efficiency was 30.4%%. The total sleep time was 119 minutes.  Stage REM latency was 257.5 minutes.  The patient spent 1.3%% of the night in stage N1 sleep, 86.1%% in stage N2 sleep, 0.0%% in stage N3 and 12.61% in REM.  Alpha intrusion was absent.  Supine sleep was 0.00%.  RESPIRATORY PARAMETERS The overall apnea/hypopnea index (AHI) was 10.1 per hour. There were 10 total apneas, including 5 obstructive, 0 central and 5 mixed apneas. There were 10 hypopneas and 19 RERAs.  The AHI during Stage REM sleep was 52.0 per hour.  AHI while supine was N/A per hour.  The mean oxygen saturation was 95.9%. The minimum SpO2 during sleep was 89.0%.  moderate snoring was noted during this study.  CARDIAC DATA The 2 lead EKG demonstrated sinus rhythm. The mean heart rate was 100.0  beats per minute. Other EKG findings include: PVCs.  LEG MOVEMENT DATA The total PLMS were 0 with a resulting PLMS index of 0.0. Associated arousal with leg movement index was 0.0 .  IMPRESSIONS - Mild obstructive sleep apnea occurred during this study (AHI = 10.1/h). - Significant difficulty initiating and maintaining sleep with only 119 minutes total sleep time recorded - No significant central sleep apnea occurred during this study (CAI = 0.0/h). - The patient had minimal oxygen desaturation during the study (Min O2 = 89.0%) - The patient snored with moderate snoring volume. - EKG findings include PVCs and sinus tachycardia. - Clinically significant periodic limb movements did not occur during sleep. No significant associated arousals.  DIAGNOSIS - Obstructive Sleep Apnea (327.23 [G47.33 ICD-10]) - Insomnia  RECOMMENDATIONS - Suggest CPAP titration sleep study or DME autopap. Other options would be based on clinical judgment. - Depending on home sleep pattern, patient may benefit from treatment for insomnia beyond hydroxyzine. - Sleep hygiene should be reviewed to assess factors that may improve sleep quality. - Weight management and regular exercise should be initiated or continued if appropriate.  [Electronically signed] 03/10/2018 04:36 PM  Jetty Duhamellinton Young MD, ABSM Diplomate, American Board of Sleep Medicine   NPI: [id number]                         Jetty Duhamellinton Young Diplomate, American Board of Sleep Medicine  ELECTRONICALLY SIGNED ON:  03/10/2018, 4:31 PM [facility] PH: [phone]   FX: [phone] ACCREDITED BY THE AMERICAN ACADEMY OF SLEEP MEDICINE

## 2018-03-14 ENCOUNTER — Other Ambulatory Visit: Payer: Self-pay | Admitting: Family Medicine

## 2018-03-14 DIAGNOSIS — G4733 Obstructive sleep apnea (adult) (pediatric): Secondary | ICD-10-CM

## 2018-03-22 ENCOUNTER — Telehealth: Payer: Self-pay | Admitting: Family Medicine

## 2018-03-22 NOTE — Telephone Encounter (Signed)
Viewed by Lorin GlassJerry L Tinnon on 03/22/2018 2:02 PM  Written by Hoy RegisterNewlin, Rondy Krupinski, MD on 03/14/2018 8:09 AM  Your sleep study revealed sleep apnea but additional testing in the form of CPAP titration is required which I have ordered  The above note is what I pulled from epic regarding her sleep study report sent to her via my chart.Thanks

## 2018-03-22 NOTE — Telephone Encounter (Signed)
Patient called for results I dont have any. Please follow up.

## 2018-03-22 NOTE — Telephone Encounter (Signed)
Sleep study results.

## 2018-03-23 NOTE — Telephone Encounter (Signed)
Patient was called and informed of results. 

## 2018-03-29 ENCOUNTER — Ambulatory Visit (INDEPENDENT_AMBULATORY_CARE_PROVIDER_SITE_OTHER): Payer: Medicare Other | Admitting: Allergy and Immunology

## 2018-03-29 ENCOUNTER — Encounter: Payer: Self-pay | Admitting: Allergy and Immunology

## 2018-03-29 VITALS — BP 136/72 | HR 82 | Temp 98.6°F | Resp 18 | Ht 65.0 in | Wt 169.0 lb

## 2018-03-29 DIAGNOSIS — J310 Chronic rhinitis: Secondary | ICD-10-CM

## 2018-03-29 DIAGNOSIS — L500 Allergic urticaria: Secondary | ICD-10-CM

## 2018-03-29 DIAGNOSIS — L509 Urticaria, unspecified: Secondary | ICD-10-CM | POA: Diagnosis not present

## 2018-03-29 DIAGNOSIS — T7840XD Allergy, unspecified, subsequent encounter: Secondary | ICD-10-CM | POA: Diagnosis not present

## 2018-03-29 DIAGNOSIS — T783XXD Angioneurotic edema, subsequent encounter: Secondary | ICD-10-CM | POA: Diagnosis not present

## 2018-03-29 DIAGNOSIS — T783XXA Angioneurotic edema, initial encounter: Secondary | ICD-10-CM | POA: Insufficient documentation

## 2018-03-29 DIAGNOSIS — T7840XA Allergy, unspecified, initial encounter: Secondary | ICD-10-CM | POA: Insufficient documentation

## 2018-03-29 MED ORDER — FLUTICASONE PROPIONATE 50 MCG/ACT NA SUSP: NASAL | 5 refills | Status: DC

## 2018-03-29 MED ORDER — EPINEPHRINE 0.3 MG/0.3ML IJ SOAJ: 0.3000 mg | Freq: Once | INTRAMUSCULAR | 1 refills | Status: AC

## 2018-03-29 MED FILL — FLUTICASONE PROP 50 MCG SPR: 50 | 30 days supply | Qty: 16 | Fill #0

## 2018-03-29 NOTE — Patient Instructions (Addendum)
Allergic reaction The patients history suggests allergic reaction with an unclear trigger. Food allergen skin tests were negative today despite a positive histamine control. The negative predictive value for skin tests is excellent (greater than 95%). We will proceed with in vitro lab studies to help establish an etiology.  The following labs have been ordered: FCeRI antibody, anti-thyroglobulin antibody, thyroid peroxidase antibody, baseline serum tryptase, C4 level, CBC, CMP, ESR, ANA, and serum specific IgE against citrus fruits panel, shellfish panel, fish panel, and galactose-alpha-1,3-galactose panel.   Until an etiology is established, I have recommended careful avoidance of shellfish, fish, and citrus fruits.  Should symptoms recur, a journal is to be kept recording any foods eaten, beverages consumed, medications taken within a 6 hour period prior to the onset of symptoms, as well as activities performed, and environmental conditions. For any symptoms concerning for anaphylaxis, epinephrine is to be administered and 911 is to be called immediately.  A prescription has been provided for epinephrine autoinjector 2 pack along with instructions for its proper administration.  Recurrent urticaria  Labs have been ordered (as above).  Continue levocetirizine 5 mg daily as needed.  Rhinitis Seasonal and perennial aeroallergens skin tests were negative despite a positive histamine control.  A prescription has been provided for fluticasone nasal spray, one spray per nostril 1-2 times daily as needed. Proper nasal spray technique has been discussed and demonstrated.  Nasal saline spray (i.e., Simply Saline) or nasal saline lavage (i.e., NeilMed) is recommended as needed and prior to medicated nasal sprays.   When lab results have returned the patient will be called with further recommendations and follow up instructions.

## 2018-03-29 NOTE — Assessment & Plan Note (Signed)
Seasonal and perennial aeroallergens skin tests were negative despite a positive histamine control.  A prescription has been provided for fluticasone nasal spray, one spray per nostril 1-2 times daily as needed. Proper nasal spray technique has been discussed and demonstrated.  Nasal saline spray (i.e., Simply Saline) or nasal saline lavage (i.e., NeilMed) is recommended as needed and prior to medicated nasal sprays.

## 2018-03-29 NOTE — Assessment & Plan Note (Addendum)
The patients history suggests allergic reaction with an unclear trigger. Food allergen skin tests were negative today despite a positive histamine control. The negative predictive value for skin tests is excellent (greater than 95%). We will proceed with in vitro lab studies to help establish an etiology.  The following labs have been ordered: FCeRI antibody, anti-thyroglobulin antibody, thyroid peroxidase antibody, baseline serum tryptase, C4 level, CBC, CMP, ESR, ANA, and serum specific IgE against citrus fruits panel, shellfish panel, fish panel, and galactose-alpha-1,3-galactose panel.   Until an etiology is established, I have recommended careful avoidance of shellfish, fish, and citrus fruits.  Should symptoms recur, a journal is to be kept recording any foods eaten, beverages consumed, medications taken within a 6 hour period prior to the onset of symptoms, as well as activities performed, and environmental conditions. For any symptoms concerning for anaphylaxis, epinephrine is to be administered and 911 is to be called immediately.  A prescription has been provided for epinephrine autoinjector 2 pack along with instructions for its proper administration.

## 2018-03-29 NOTE — Progress Notes (Signed)
New Patient Note  RE: Sabrina Ortiz MRN: [id number] DOB: [birth year] Date of Office Visit: 03/29/2018  Referring provider: Charlott Rakes, MD Primary care provider: Charlott Rakes, MD  Chief Complaint: Allergic Reaction   History of present illness: Sabrina Ortiz is a 47 y.o. female seen today in consultation requested by Charlott Rakes, MD.  She reports that approximately 4 or 5 years ago she consumed Tylenol as needed shrimp and experienced pharyngeal pruritus.  Later that evening she developed a swelling sensation in her throat.  Since that time she has had multiple episodes of itchy throat followed a few hours later by the sensation of throat swelling.  She reports that this typically occurs if she consumes a significant amount of citrus fruit and/or fish and/or shellfish.  She states that her "uvula swells up real bad" typically several hours after consuming the suspected culprit food and that she often times awakens from sleep "gasping for air."  She claims that she is able to consume small amounts of fish and shellfish without experiencing symptoms.  She states that she was prescribed an epinephrine autoinjector but admits that she did not pick it up because of the expense. Sabrina Ortiz takes levocetirizine as needed for occasional generalized urticaria.  She first started having episodes of recurrent hives in 2009.  The hives are described as erythematous, raised, and pruritic.  She believes that the urticaria is related to anxiety. She has experienced nasal congestion, rhinorrhea, and sneezing.  The symptoms have historically been most frequent and severe during the summertime.  Assessment and plan: Allergic reaction The patients history suggests allergic reaction with an unclear trigger. Food allergen skin tests were negative today despite a positive histamine control. The negative predictive value for skin tests is excellent (greater than 95%). We will proceed with in vitro lab  studies to help establish an etiology.  The following labs have been ordered: FCeRI antibody, anti-thyroglobulin antibody, thyroid peroxidase antibody, baseline serum tryptase, C4 level, CBC, CMP, ESR, ANA, and serum specific IgE against citrus fruits panel, shellfish panel, fish panel, and galactose-alpha-1,3-galactose panel.   Until an etiology is established, I have recommended careful avoidance of shellfish, fish, and citrus fruits.  Should symptoms recur, a journal is to be kept recording any foods eaten, beverages consumed, medications taken within a 6 hour period prior to the onset of symptoms, as well as activities performed, and environmental conditions. For any symptoms concerning for anaphylaxis, epinephrine is to be administered and 911 is to be called immediately.  A prescription has been provided for epinephrine autoinjector 2 pack along with instructions for its proper administration.  Recurrent urticaria  Labs have been ordered (as above).  Continue levocetirizine 5 mg daily as needed.  Rhinitis Seasonal and perennial aeroallergens skin tests were negative despite a positive histamine control.  A prescription has been provided for fluticasone nasal spray, one spray per nostril 1-2 times daily as needed. Proper nasal spray technique has been discussed and demonstrated.  Nasal saline spray (i.e., Simply Saline) or nasal saline lavage (i.e., NeilMed) is recommended as needed and prior to medicated nasal sprays.   Meds ordered this encounter  Medications  . EPINEPHrine 0.3 mg/0.3 mL IJ SOAJ injection    Sig: Inject 0.3 mLs (0.3 mg total) into the muscle once for 1 dose.    Dispense:  2 Device    Refill:  1    Please dispense Mylan brand generic only. Thank you.  . fluticasone (FLONASE) 50 MCG/ACT nasal spray  Sig: 1 spray in each nostril 1-2 times daily as needed    Dispense:  16 g    Refill:  5    Diagnostics: Environmental skin testing: Negative despite a  positive histamine control. Food allergen skin testing: Negative despite a positive histamine control.    Physical examination: Blood pressure 136/72, pulse 82, temperature 98.6 F (37 C), temperature source Oral, resp. rate 18, height _0  (1.651 m), weight 169 lb (76.7 kg), SpO2 97 %.  General: Alert, interactive, in no acute distress. HEENT: TMs pearly gray, turbinates moderately edematous with thick discharge, post-pharynx mildly erythematous. Neck: Supple without lymphadenopathy. Lungs: Clear to auscultation without wheezing, rhonchi or rales. CV: Normal S1, S2 without murmurs. Abdomen: Nondistended, nontender. Skin: Warm and dry, without lesions or rashes. Extremities:  No clubbing, cyanosis or edema. Neuro:   Grossly intact.  Review of systems:  Review of systems negative except as noted in HPI / PMHx or noted below: Review of Systems  Constitutional: Negative.   HENT: Negative.   Eyes: Negative.   Respiratory: Negative.   Cardiovascular: Negative.   Gastrointestinal: Negative.   Genitourinary: Negative.   Musculoskeletal: Negative.   Skin: Negative.   Neurological: Negative.   Endo/Heme/Allergies: Negative.   Psychiatric/Behavioral: Negative.     Past medical history:  Past Medical History:  Diagnosis Date  . Angio-edema   . Anxiety   . Chronic back pain   . Depression   . Hypertension   . Mental disorder   . PTSD (post-traumatic stress disorder)   . Urticaria     Past surgical history:  Past Surgical History:  Procedure Laterality Date  . CYST REMOVAL HAND      Family history: History reviewed. No pertinent family history.  Social history: Social History   Socioeconomic History  . Marital status: Single    Spouse name: Not on file  . Number of children: Not on file  . Years of education: Not on file  . Highest education level: Not on file  Occupational History  . Not on file  Social Needs  . Financial resource strain: Not on file  . Food  insecurity:    Worry: Not on file    Inability: Not on file  . Transportation needs:    Medical: Not on file    Non-medical: Not on file  Tobacco Use  . Smoking status: Never Smoker  . Smokeless tobacco: Never Used  Substance and Sexual Activity  . Alcohol use: No  . Drug use: No  . Sexual activity: Not on file  Lifestyle  . Physical activity:    Days per week: Not on file    Minutes per session: Not on file  . Stress: Not on file  Relationships  . Social connections:    Talks on phone: Not on file    Gets together: Not on file    Attends religious service: Not on file    Active member of club or organization: Not on file    Attends meetings of clubs or organizations: Not on file    Relationship status: Not on file  . Intimate partner violence:    Fear of current or ex partner: Not on file    Emotionally abused: Not on file    Physically abused: Not on file    Forced sexual activity: Not on file  Other Topics Concern  . Not on file  Social History Narrative  . Not on file   Environmental History: The patient lives in a  47 year old house with hardwood floors throughout, gas heat, and central air.  She is a non-smoker without pets.  There is no known mold/water damage in the home.  Allergies as of 03/29/2018      Reactions   Shellfish Allergy    Swelling up throat. Eat seafood, her throat may swell up when eat 2-3x a month.       Medication List        Accurate as of 03/29/18  7:08 PM. Always use your most recent med list.          carvedilol 6.25 MG tablet Commonly known as:  COREG Take 1 tablet (6.25 mg total) by mouth 2 (two) times daily with a meal.   cloNIDine 0.1 MG tablet Commonly known as:  CATAPRES Take 1 tablet (0.1 mg total) by mouth at bedtime.   EPINEPHrine 0.3 mg/0.3 mL Soaj injection Commonly known as:  EPI-PEN Inject 0.3 mLs (0.3 mg total) into the muscle once for 1 dose.   fluticasone 50 MCG/ACT nasal spray Commonly known as:  FLONASE 1  spray in each nostril 1-2 times daily as needed   hydrOXYzine 25 MG tablet Commonly known as:  ATARAX/VISTARIL Take 1 tablet (25 mg total) by mouth 3 (three) times daily as needed.   levocetirizine 5 MG tablet Commonly known as:  XYZAL Take 1 tablet (5 mg total) by mouth every evening.   lisinopril-hydrochlorothiazide 20-12.5 MG tablet Commonly known as:  PRINZIDE,ZESTORETIC Take 1 tablet by mouth daily.   megestrol 40 MG tablet Commonly known as:  MEGACE TAKE 1 TABLET BY MOUTH TWICE DAILY.   omeprazole 20 MG capsule Commonly known as:  PRILOSEC Take 1 capsule (20 mg total) by mouth daily.   pravastatin 40 MG tablet Commonly known as:  PRAVACHOL Take 1 tablet (40 mg total) by mouth daily.   traZODone 50 MG tablet Commonly known as:  DESYREL Take 1 tablet (50 mg total) by mouth at bedtime.   Vitamin D (Ergocalciferol) 50000 units Caps capsule Commonly known as:  DRISDOL Take 1 capsule (50,000 Units total) by mouth every 7 (seven) days.       Known medication allergies: Allergies  Allergen Reactions  . Shellfish Allergy     Swelling up throat. Eat seafood, her throat may swell up when eat 2-3x a month.     I appreciate the opportunity to take part in Sabrina Ortiz's care. Please do not hesitate to contact me with questions.  Sincerely,   R. Edgar Frisk, MD

## 2018-03-29 NOTE — Assessment & Plan Note (Signed)
   Labs have been ordered (as above).  Continue levocetirizine 5 mg daily as needed.

## 2018-04-05 LAB — COMPREHENSIVE METABOLIC PANEL
ALT: 70 IU/L — ABNORMAL HIGH (ref 0–32)
AST: 44 IU/L — ABNORMAL HIGH (ref 0–40)
Albumin/Globulin Ratio: 1.7 (ref 1.2–2.2)
Albumin: 4.8 g/dL (ref 3.5–5.5)
Alkaline Phosphatase: 55 IU/L (ref 39–117)
BUN/Creatinine Ratio: 13 (ref 9–23)
BUN: 13 mg/dL (ref 6–24)
Bilirubin Total: 0.5 mg/dL (ref 0.0–1.2)
CO2: 21 mmol/L (ref 20–29)
Calcium: 9.8 mg/dL (ref 8.7–10.2)
Chloride: 100 mmol/L (ref 96–106)
Creatinine, Ser: 1.04 mg/dL — ABNORMAL HIGH (ref 0.57–1.00)
GFR calc Af Amer: 74 mL/min/{1.73_m2} (ref >59)
GFR calc non Af Amer: 64 mL/min/{1.73_m2} (ref >59)
Globulin, Total: 2.8 g/dL (ref 1.5–4.5)
Glucose: 120 mg/dL — ABNORMAL HIGH (ref 65–99)
Potassium: 4.2 mmol/L (ref 3.5–5.2)
Sodium: 140 mmol/L (ref 134–144)
Total Protein: 7.6 g/dL (ref 6.0–8.5)

## 2018-04-05 LAB — ALLERGEN PROFILE, FOOD-CITRUS
Allergen Grapefruit IgE: <0.1 kU/L
Allergen Lime IgE: <0.1 kU/L
Lemon: <0.1 kU/L
Orange: <0.1 kU/L
Tangerine IgE: <0.1 kU/L

## 2018-04-05 LAB — CBC WITH DIFFERENTIAL/PLATELET
Basophils Absolute: 0.1 10*3/uL (ref 0.0–0.2)
Basos: 1 %
EOS (ABSOLUTE): 0.6 10*3/uL — ABNORMAL HIGH (ref 0.0–0.4)
Eos: 9 %
Hematocrit: 45.6 % (ref 34.0–46.6)
Hemoglobin: 14.7 g/dL (ref 11.1–15.9)
Immature Grans (Abs): 0 10*3/uL (ref 0.0–0.1)
Immature Granulocytes: 0 %
Lymphocytes Absolute: 2.1 10*3/uL (ref 0.7–3.1)
Lymphs: 32 %
MCH: 26.3 pg — ABNORMAL LOW (ref 26.6–33.0)
MCHC: 32.2 g/dL (ref 31.5–35.7)
MCV: 81 fL (ref 79–97)
Monocytes Absolute: 0.5 10*3/uL (ref 0.1–0.9)
Monocytes: 7 %
Neutrophils Absolute: 3.4 10*3/uL (ref 1.4–7.0)
Neutrophils: 51 %
Platelets: 164 10*3/uL (ref 150–450)
RBC: 5.6 x10E6/uL — ABNORMAL HIGH (ref 3.77–5.28)
RDW: 14.6 % (ref 12.3–15.4)
WBC: 6.6 10*3/uL (ref 3.4–10.8)

## 2018-04-05 LAB — ALLERGEN PROFILE, SHELLFISH
Clam IgE: <0.1 kU/L
F023-IgE Crab: <0.1 kU/L
F080-IgE Lobster: <0.1 kU/L
F290-IgE Oyster: <0.1 kU/L
Scallop IgE: <0.1 kU/L
Shrimp IgE: 0.24 kU/L — AB

## 2018-04-05 LAB — ALLERGEN PROFILE, FOOD-FISH
Allergen Mackerel IgE: <0.1 kU/L
Allergen Salmon IgE: <0.1 kU/L
Allergen Trout IgE: <0.1 kU/L
Allergen Walley Pike IgE: <0.1 kU/L
Codfish IgE: <0.1 kU/L
Halibut IgE: <0.1 kU/L
Tuna: <0.1 kU/L

## 2018-04-05 LAB — CHRONIC URTICARIA: cu index: <1.4 (ref <10)

## 2018-04-05 LAB — ANA W/REFLEX: Anti Nuclear Antibody(ANA): NEGATIVE

## 2018-04-05 LAB — ALPHA-GAL PANEL
Alpha Gal IgE*: <0.1 kU/L (ref <0.10)
Beef (Bos spp) IgE: <0.1 kU/L (ref <0.35)
Class Interpretation: 0
Class Interpretation: 0
Class Interpretation: 0
Lamb/Mutton (Ovis spp) IgE: <0.1 kU/L (ref <0.35)
Pork (Sus spp) IgE: <0.1 kU/L (ref <0.35)

## 2018-04-05 LAB — THYROID PEROXIDASE ANTIBODY: Thyroperoxidase Ab SerPl-aCnc: 11 IU/mL (ref 0–34)

## 2018-04-05 LAB — SEDIMENTATION RATE: Sed Rate: 27 mm/hr (ref 0–32)

## 2018-04-05 LAB — C4 COMPLEMENT: Complement C4, Serum: 39 mg/dL (ref 14–44)

## 2018-04-05 LAB — TRYPTASE: Tryptase: 3.4 ug/L (ref 2.2–13.2)

## 2018-04-05 LAB — THYROGLOBULIN ANTIBODY: Thyroglobulin Antibody: <1 IU/mL (ref 0.0–0.9)

## 2018-04-06 ENCOUNTER — Ambulatory Visit: Payer: Self-pay | Admitting: Family Medicine

## 2018-04-09 ENCOUNTER — Inpatient Hospital Stay (HOSPITAL_COMMUNITY)
Admission: EM | Admit: 2018-04-09 | Discharge: 2018-04-14 | DRG: 286 | Disposition: A | Discharge status: Home / Self Care | Payer: Medicare Other | Attending: Cardiovascular Disease | Admitting: Cardiovascular Disease

## 2018-04-09 ENCOUNTER — Other Ambulatory Visit: Payer: Self-pay

## 2018-04-09 ENCOUNTER — Emergency Department (HOSPITAL_COMMUNITY): Payer: Medicare Other

## 2018-04-09 ENCOUNTER — Encounter (HOSPITAL_COMMUNITY): Payer: Self-pay | Admitting: Internal Medicine

## 2018-04-09 ENCOUNTER — Inpatient Hospital Stay (HOSPITAL_COMMUNITY): Payer: Medicare Other

## 2018-04-09 DIAGNOSIS — J811 Chronic pulmonary edema: Secondary | ICD-10-CM | POA: Diagnosis not present

## 2018-04-09 DIAGNOSIS — I5021 Acute systolic (congestive) heart failure: Secondary | ICD-10-CM | POA: Diagnosis not present

## 2018-04-09 DIAGNOSIS — R079 Chest pain, unspecified: Secondary | ICD-10-CM | POA: Diagnosis not present

## 2018-04-09 DIAGNOSIS — I420 Dilated cardiomyopathy: Secondary | ICD-10-CM | POA: Diagnosis present

## 2018-04-09 DIAGNOSIS — J810 Acute pulmonary edema: Secondary | ICD-10-CM | POA: Diagnosis not present

## 2018-04-09 DIAGNOSIS — I509 Heart failure, unspecified: Secondary | ICD-10-CM | POA: Diagnosis not present

## 2018-04-09 DIAGNOSIS — I161 Hypertensive emergency: Secondary | ICD-10-CM | POA: Diagnosis not present

## 2018-04-09 DIAGNOSIS — I10 Essential (primary) hypertension: Secondary | ICD-10-CM | POA: Diagnosis not present

## 2018-04-09 DIAGNOSIS — F411 Generalized anxiety disorder: Secondary | ICD-10-CM | POA: Diagnosis not present

## 2018-04-09 DIAGNOSIS — R739 Hyperglycemia, unspecified: Secondary | ICD-10-CM | POA: Diagnosis not present

## 2018-04-09 DIAGNOSIS — E559 Vitamin D deficiency, unspecified: Secondary | ICD-10-CM | POA: Diagnosis present

## 2018-04-09 DIAGNOSIS — Z91013 Allergy to seafood: Secondary | ICD-10-CM | POA: Diagnosis not present

## 2018-04-09 DIAGNOSIS — R7303 Prediabetes: Secondary | ICD-10-CM | POA: Diagnosis present

## 2018-04-09 DIAGNOSIS — R0602 Shortness of breath: Secondary | ICD-10-CM | POA: Diagnosis not present

## 2018-04-09 DIAGNOSIS — E785 Hyperlipidemia, unspecified: Secondary | ICD-10-CM | POA: Diagnosis present

## 2018-04-09 DIAGNOSIS — I110 Hypertensive heart disease with heart failure: Principal | ICD-10-CM | POA: Diagnosis present

## 2018-04-09 DIAGNOSIS — F4310 Post-traumatic stress disorder, unspecified: Secondary | ICD-10-CM | POA: Diagnosis not present

## 2018-04-09 DIAGNOSIS — I25110 Atherosclerotic heart disease of native coronary artery with unstable angina pectoris: Secondary | ICD-10-CM | POA: Diagnosis not present

## 2018-04-09 DIAGNOSIS — R0789 Other chest pain: Secondary | ICD-10-CM | POA: Diagnosis not present

## 2018-04-09 DIAGNOSIS — I272 Pulmonary hypertension, unspecified: Secondary | ICD-10-CM | POA: Diagnosis not present

## 2018-04-09 DIAGNOSIS — I5020 Unspecified systolic (congestive) heart failure: Secondary | ICD-10-CM | POA: Insufficient documentation

## 2018-04-09 DIAGNOSIS — E876 Hypokalemia: Secondary | ICD-10-CM | POA: Diagnosis not present

## 2018-04-09 LAB — COMPREHENSIVE METABOLIC PANEL
ALBUMIN: 3.9 g/dL (ref 3.5–5.0)
ALT: 52 U/L — ABNORMAL HIGH (ref 0–44)
ANION GAP: 12 (ref 5–15)
AST: 36 U/L (ref 15–41)
Alkaline Phosphatase: 56 U/L (ref 38–126)
BUN: 8 mg/dL (ref 6–20)
CALCIUM: 9.2 mg/dL (ref 8.9–10.3)
CO2: 21 mmol/L — AB (ref 22–32)
Chloride: 107 mmol/L (ref 98–111)
Creatinine, Ser: 1.17 mg/dL — ABNORMAL HIGH (ref 0.44–1.00)
GFR calc Af Amer: >60 mL/min (ref >60)
GFR calc non Af Amer: 55 mL/min — ABNORMAL LOW (ref >60)
GLUCOSE: 139 mg/dL — AB (ref 70–99)
Potassium: 3.5 mmol/L (ref 3.5–5.1)
SODIUM: 140 mmol/L (ref 135–145)
Total Bilirubin: 1.2 mg/dL (ref 0.3–1.2)
Total Protein: 7 g/dL (ref 6.5–8.1)

## 2018-04-09 LAB — BASIC METABOLIC PANEL
ANION GAP: 12 (ref 5–15)
BUN: 7 mg/dL (ref 6–20)
CO2: 21 mmol/L — AB (ref 22–32)
Calcium: 9.2 mg/dL (ref 8.9–10.3)
Chloride: 107 mmol/L (ref 98–111)
Creatinine, Ser: 1.15 mg/dL — ABNORMAL HIGH (ref 0.44–1.00)
GFR calc Af Amer: >60 mL/min (ref >60)
GFR calc non Af Amer: 56 mL/min — ABNORMAL LOW (ref >60)
Glucose, Bld: 142 mg/dL — ABNORMAL HIGH (ref 70–99)
POTASSIUM: 3.5 mmol/L (ref 3.5–5.1)
Sodium: 140 mmol/L (ref 135–145)

## 2018-04-09 LAB — URINALYSIS, ROUTINE W REFLEX MICROSCOPIC
BILIRUBIN URINE: NEGATIVE
Bacteria, UA: NONE SEEN
Glucose, UA: NEGATIVE mg/dL
KETONES UR: NEGATIVE mg/dL
LEUKOCYTES UA: NEGATIVE
NITRITE: NEGATIVE
PROTEIN: NEGATIVE mg/dL
Specific Gravity, Urine: 1.003 — ABNORMAL LOW (ref 1.005–1.030)
pH: 5 (ref 5.0–8.0)

## 2018-04-09 LAB — CBC
HEMATOCRIT: 45 % (ref 36.0–46.0)
HEMOGLOBIN: 14.1 g/dL (ref 12.0–15.0)
MCH: 26.7 pg (ref 26.0–34.0)
MCHC: 31.3 g/dL (ref 30.0–36.0)
MCV: 85.1 fL (ref 78.0–100.0)
Platelets: 143 10*3/uL — ABNORMAL LOW (ref 150–400)
RBC: 5.29 MIL/uL — ABNORMAL HIGH (ref 3.87–5.11)
RDW: 15.5 % (ref 11.5–15.5)
WBC: 7.8 10*3/uL (ref 4.0–10.5)

## 2018-04-09 LAB — I-STAT TROPONIN, ED: Troponin i, poc: 0.01 ng/mL (ref 0.00–0.08)

## 2018-04-09 LAB — I-STAT BETA HCG BLOOD, ED (MC, WL, AP ONLY): I-stat hCG, quantitative: <5 m[IU]/mL (ref <5)

## 2018-04-09 LAB — D-DIMER, QUANTITATIVE: D-Dimer, Quant: 0.6 ug/mL-FEU — ABNORMAL HIGH (ref 0.00–0.50)

## 2018-04-09 LAB — RAPID URINE DRUG SCREEN, HOSP PERFORMED
Amphetamines: NOT DETECTED
Barbiturates: NOT DETECTED
Benzodiazepines: NOT DETECTED
Cocaine: NOT DETECTED
Opiates: NOT DETECTED
Tetrahydrocannabinol: NOT DETECTED

## 2018-04-09 LAB — I-STAT CG4 LACTIC ACID, ED: LACTIC ACID, VENOUS: 1.5 mmol/L (ref 0.5–1.9)

## 2018-04-09 LAB — PROTIME-INR
INR: 1.1
Prothrombin Time: 14.1 seconds (ref 11.4–15.2)

## 2018-04-09 LAB — TSH: TSH: 2.345 u[IU]/mL (ref 0.350–4.500)

## 2018-04-09 LAB — BRAIN NATRIURETIC PEPTIDE: B Natriuretic Peptide: 1027.2 pg/mL — ABNORMAL HIGH (ref 0.0–100.0)

## 2018-04-09 LAB — TROPONIN I: Troponin I: <0.03 ng/mL (ref <0.03)

## 2018-04-09 LAB — MAGNESIUM: Magnesium: 2.1 mg/dL (ref 1.7–2.4)

## 2018-04-09 MED ORDER — HYDRALAZINE HCL 10 MG PO TABS: 10.0000 mg | ORAL_TABLET | Freq: Three times a day (TID) | ORAL | Status: DC | PRN

## 2018-04-09 MED ORDER — PANTOPRAZOLE SODIUM 40 MG PO TBEC
40.0000 mg | DELAYED_RELEASE_TABLET | Freq: Every day | ORAL | Status: DC
  Administered 2018-04-09 – 2018-04-14 (×6): 40 mg via ORAL
  Filled 2018-04-09 (×6): qty 1

## 2018-04-09 MED ORDER — IOPAMIDOL (ISOVUE-370) INJECTION 76%
100.0000 mL | Freq: Once | INTRAVENOUS | Status: AC | PRN
  Administered 2018-04-09: 100 mL via INTRAVENOUS

## 2018-04-09 MED ORDER — METOPROLOL TARTRATE 5 MG/5ML IV SOLN
5.0000 mg | Freq: Once | INTRAVENOUS | Status: AC
  Administered 2018-04-09: 5 mg via INTRAVENOUS
  Filled 2018-04-09: qty 5

## 2018-04-09 MED ORDER — ASPIRIN 81 MG PO CHEW
324.0000 mg | CHEWABLE_TABLET | Freq: Once | ORAL | Status: AC
  Administered 2018-04-09: 324 mg via ORAL
  Filled 2018-04-09: qty 4

## 2018-04-09 MED ORDER — FUROSEMIDE 10 MG/ML IJ SOLN
20.0000 mg | Freq: Every day | INTRAMUSCULAR | Status: DC
  Administered 2018-04-09 – 2018-04-10 (×2): 20 mg via INTRAVENOUS
  Filled 2018-04-09 (×2): qty 2

## 2018-04-09 MED ORDER — METOPROLOL TARTRATE 5 MG/5ML IV SOLN: 5.0000 mg | Freq: Once | INTRAVENOUS | Status: DC

## 2018-04-09 MED ORDER — NITROGLYCERIN 2 % TD OINT: 1.0000 [in_us] | TOPICAL_OINTMENT | Freq: Four times a day (QID) | TRANSDERMAL | Status: DC

## 2018-04-09 MED ORDER — NAPHAZOLINE-PHENIRAMINE 0.025-0.3 % OP SOLN
1.0000 [drp] | Freq: Four times a day (QID) | OPHTHALMIC | Status: DC | PRN
  Administered 2018-04-11 – 2018-04-13 (×2): 1 [drp] via OPHTHALMIC
  Filled 2018-04-09: qty 15

## 2018-04-09 MED ORDER — VITAMIN D (ERGOCALCIFEROL) 1.25 MG (50000 UNIT) PO CAPS
50000.0000 [IU] | ORAL_CAPSULE | ORAL | Status: DC
  Administered 2018-04-14: 50000 [IU] via ORAL
  Filled 2018-04-09: qty 1

## 2018-04-09 MED ORDER — ENOXAPARIN SODIUM 40 MG/0.4ML ~~LOC~~ SOLN
40.0000 mg | SUBCUTANEOUS | Status: DC
  Administered 2018-04-09 – 2018-04-13 (×5): 40 mg via SUBCUTANEOUS
  Filled 2018-04-09 (×5): qty 0.4

## 2018-04-09 MED ORDER — IOPAMIDOL (ISOVUE-370) INJECTION 76%
INTRAVENOUS | Status: AC
  Filled 2018-04-09: qty 100

## 2018-04-09 MED ORDER — PRAVASTATIN SODIUM 40 MG PO TABS
40.0000 mg | ORAL_TABLET | Freq: Every day | ORAL | Status: DC
  Administered 2018-04-09 – 2018-04-10 (×2): 40 mg via ORAL
  Filled 2018-04-09 (×2): qty 1

## 2018-04-09 MED ORDER — FUROSEMIDE 10 MG/ML IJ SOLN
40.0000 mg | Freq: Once | INTRAMUSCULAR | Status: AC
  Administered 2018-04-09: 40 mg via INTRAVENOUS
  Filled 2018-04-09: qty 4

## 2018-04-09 MED ORDER — MELATONIN 3 MG PO TABS
3.0000 mg | ORAL_TABLET | Freq: Every evening | ORAL | Status: DC | PRN
  Administered 2018-04-09 – 2018-04-12 (×4): 3 mg via ORAL
  Filled 2018-04-09 (×6): qty 1

## 2018-04-09 MED ORDER — NITROGLYCERIN 0.4 MG SL SUBL
0.4000 mg | SUBLINGUAL_TABLET | SUBLINGUAL | Status: DC | PRN
  Administered 2018-04-09: 0.4 mg via SUBLINGUAL
  Filled 2018-04-09: qty 1

## 2018-04-09 MED ORDER — CLONIDINE HCL 0.1 MG PO TABS
0.1000 mg | ORAL_TABLET | Freq: Every day | ORAL | Status: DC
  Administered 2018-04-09 – 2018-04-12 (×3): 0.1 mg via ORAL
  Filled 2018-04-09 (×4): qty 1

## 2018-04-09 NOTE — ED Notes (Signed)
Spoke to Admitting. Told to hold meds until they come to see the pt. Pt no longer having chest pain.

## 2018-04-09 NOTE — ED Notes (Signed)
spke to lab, states they will add additional orders

## 2018-04-09 NOTE — ED Provider Notes (Addendum)
[facility] EMERGENCY DEPARTMENT Provider Note   CSN: [id number] Arrival date & time: 04/09/18  1046     History   Chief Complaint Chief Complaint  Patient presents with  . Shortness of Breath  . Chest Pain    HPI Sabrina Ortiz is a 47 y.o. female.  HPI Shortness of breath increasing for the past couple of weeks.  Was gradual but much worse over past 3 to 4 days.  Patient reports she has a diffuse feeling of heaviness in her chest.  She reports it feels a little bit like her "heartburn".  Shortness of breath is much worse with any exertion and lying flat.  She reports that she has not been able to sleep very well for several nights now.  She denies pain or swelling in her legs.  No history of PE or DVT.  Positive history of hypertension.  Reports she did not take her morning medications because she felt so poorly.  Ports she is compliant with her medications yesterday.  Reports her only takes the blood pressure medication in the morning.  She cannot name which medication she takes.  Most recent visit is [phone] with allergist.  This medication indicates carvedilol twice daily.  Patient denies any knowledge of this medication and does not recognize the name.  She describes having her lisinopril discontinued due to concerns for allergies.  At this time, it is unclear exactly which blood pressure medications the patient is taking.  Any rate, last dose would be yesterday a.m. Past Medical History:  Diagnosis Date  . Angio-edema   . Anxiety   . Chronic back pain   . Depression   . Hypertension   . Mental disorder   . PTSD (post-traumatic stress disorder)   . Urticaria     Patient Active Problem List   Diagnosis Date Noted  . Allergic reaction 03/29/2018  . Angioedema 03/29/2018  . Recurrent urticaria 03/29/2018  . Rhinitis 03/29/2018  . Vitamin D deficiency 02/22/2018  . Anxiety 03/31/2017  . Trichomonas infection 07/17/2016  . Herpes genitalis in women  07/17/2016  . Unspecified essential hypertension 06/15/2013  . Dyslipidemia 06/15/2013  . Neuropathy 06/15/2013  . Generalized anxiety disorder 03/10/2012  . Major depressive disorder, recurrent, 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HCC) 03/08/2012    Past Surgical History:  Procedure Laterality Date  . CYST REMOVAL HAND       OB History    Gravida  3   Para  1   Term      Preterm      AB  2   Living  1     SAB      TAB  2   Ectopic      Multiple      Live Births               Home Medications    Prior to Admission medications   Medication Sig Start Date End Date Taking? Authorizing Provider  carvedilol (COREG) 6.25 MG tablet Take 1 tablet (6.25 mg total) by mouth 2 (two) times daily with a meal. 02/22/18   Hoy Register, MD  cloNIDine (CATAPRES) 0.1 MG tablet Take 1 tablet (0.1 mg total) by mouth at bedtime. 02/22/18   Hoy Register, MD  fluticasone (FLONASE) 50 MCG/ACT nasal spray 1 spray in each nostril 1-2 times daily as needed 03/29/18   Bobbitt, Heywood Iles, MD  hydrOXYzine (ATARAX/VISTARIL) 25 MG tablet Take 1 tablet (25 mg total) by mouth 3 (three)  times daily as needed. 02/22/18   Hoy Register, MD  levocetirizine (XYZAL) 5 MG tablet Take 1 tablet (5 mg total) by mouth every evening. 01/28/15   Advani, Ayesha Rumpf, MD  lisinopril-hydrochlorothiazide (PRINZIDE,ZESTORETIC) 20-12.5 MG tablet Take 1 tablet by mouth daily. 02/22/18   Hoy Register, MD  megestrol (MEGACE) 40 MG tablet TAKE 1 TABLET BY MOUTH TWICE DAILY. 10/02/17   Constant, Peggy, MD  omeprazole (PRILOSEC) 20 MG capsule Take 1 capsule (20 mg total) by mouth daily. 02/22/18   Hoy Register, MD  pravastatin (PRAVACHOL) 40 MG tablet Take 1 tablet (40 mg total) by mouth daily. 02/22/18   Hoy Register, MD  traZODone (DESYREL) 50 MG tablet Take 1 tablet (50 mg total) by mouth at bedtime. 01/28/15   Doris Cheadle, MD  Vitamin D, Ergocalciferol, (DRISDOL) 50000 units CAPS capsule Take 1 capsule (50,000  Units total) by mouth every 7 (seven) days. 01/31/18   Anders Simmonds, PA-C    Family History No family history on file.  Social History Social History   Tobacco Use  . Smoking status: Never Smoker  . Smokeless tobacco: Never Used  Substance Use Topics  . Alcohol use: No  . Drug use: No     Allergies   Shellfish allergy   Review of Systems Review of Systems 10 Systems reviewed and are negative for acute change except as noted in the HPI.   Physical Exam Updated Vital Signs BP (!) 234/215   Pulse (!) 136   Temp 99.7 F (37.6 C) (Oral)   Resp (!) 27   Ht 5\' 5"  (1.651 m)   Wt 76.7 kg (169 lb)   SpO2 95%   BMI 28.12 kg/m   Physical Exam  Constitutional: She is oriented to person, place, and time.  Patient is alert and nontoxic.  Moderate increased work of breathing with tachypnea.  HENT:  Head: Normocephalic and atraumatic.  Mouth/Throat: Oropharynx is clear and moist.  Eyes: EOM are normal.  Neck: Neck supple.  Cardiovascular:  Tachycardia.  No gross rub murmur gallop.  Monitor sinus rhythm 130s.  Pulmonary/Chest:  Tachypnea.  Speaking in full sentences.  Crackles lower one third of the lung fields.  No wheeze or rhonchi.  No cough during exam.  Abdominal: Soft. She exhibits no distension. There is no tenderness. There is no guarding.  Musculoskeletal: Normal range of motion. She exhibits no edema or tenderness.  Calf soft nontender.  No peripheral edema.  Neurological: She is alert and oriented to person, place, and time. She exhibits normal muscle tone. Coordination normal.  Skin: Skin is warm and dry.  Psychiatric: She has a normal mood and affect.     ED Treatments / Results  Labs (all labs ordered are listed, but only abnormal results are displayed) Labs Reviewed  BASIC METABOLIC PANEL - Abnormal; Notable for the following components:      Result Value   CO2 21 (*)    Glucose, Bld 142 (*)    Creatinine, Ser 1.15 (*)    GFR calc non Af Amer 56  (*)    All other components within normal limits  CBC - Abnormal; Notable for the following components:   RBC 5.29 (*)    Platelets 143 (*)    All other components within normal limits  COMPREHENSIVE METABOLIC PANEL - Abnormal; Notable for the following components:   CO2 21 (*)    Glucose, Bld 139 (*)    Creatinine, Ser 1.17 (*)    ALT 52 (*)  GFR calc non Af Amer 55 (*)    All other components within normal limits  BRAIN NATRIURETIC PEPTIDE - Abnormal; Notable for the following components:   B Natriuretic Peptide 1,027.2 (*)    All other components within normal limits  PROTIME-INR  URINALYSIS, ROUTINE W REFLEX MICROSCOPIC  RAPID URINE DRUG SCREEN, HOSP PERFORMED  D-DIMER, QUANTITATIVE (NOT AT [facility])  I-STAT TROPONIN, ED  I-STAT BETA HCG BLOOD, ED ([facility], [facility], [facility] ONLY)  I-STAT CG4 LACTIC ACID, ED    EKG EKG Interpretation  Date/Time:  Saturday April 09 2018 11:01:54 EDT Ventricular Rate:  131 PR Interval:    QRS Duration: 94 QT Interval:  388 QTC Calculation: 572 R Axis:   91 Text Interpretation:Critical Test Result: Long QTc Sinus tachycardia Rightward axis Nonspecific T wave abnormality Abnormal ECG no sig change from previous Confirmed by Arby Barrette [phone]) on 04/09/2018 12:11:54 PM   Radiology Dg Chest Portable 1 View  Result Date: 04/09/2018 CLINICAL DATA:  Diffuse chest pain and shortness of breath since yesterday. EXAM: PORTABLE CHEST 1 VIEW COMPARISON:  Chest x-ray dated 03/14/2013. FINDINGS: Mild cardiomegaly. Central pulmonary vascular congestion. Patchy bibasilar opacities, likely combination of atelectasis, edema and small pleural effusions. No pneumothorax seen. No acute or suspicious osseous finding. IMPRESSION: Cardiomegaly with central pulmonary vascular congestion indicating CHF/volume overload. Associated opacities at the lung bases which are likely a combination of associated edema, atelectasis and small pleural effusions. Electronically Signed   By: Bary Richard M.D.   On: 04/09/2018 12:22    Procedures Procedures (including critical care time) CRITICAL CARE Performed by: Arby Barrette   Total critical care time: 45 minutes  Critical care time was exclusive of separately billable procedures and treating other patients.  Critical care was necessary to treat or prevent imminent or life-threatening deterioration.  Critical care was time spent personally by me on the following activities: development of treatment plan with patient and/or surrogate as well as nursing, discussions with consultants, evaluation of patient's response to treatment, examination of patient, obtaining history from patient or surrogate, ordering and performing treatments and interventions, ordering and review of laboratory studies, ordering and review of radiographic studies, pulse oximetry and re-evaluation of patient's condition. Medications Ordered in ED Medications  nitroGLYCERIN (NITROSTAT) SL tablet 0.4 mg (0.4 mg Sublingual Given 04/09/18 1230)  metoprolol tartrate (LOPRESSOR) injection 5 mg (has no administration in time range)  nitroGLYCERIN (NITROGLYN) 2 % ointment 1 inch (has no administration in time range)  aspirin chewable tablet 324 mg (324 mg Oral Given 04/09/18 1228)  furosemide (LASIX) injection 40 mg (40 mg Intravenous Given 04/09/18 1226)     Initial Impression / Assessment and Plan / ED Course  I have reviewed the triage vital signs and the nursing notes.  Pertinent labs & imaging results that were available during my care of the patient were reviewed by me and considered in my medical decision making (see chart for details).  Clinical Course as of Apr 10 1307  Sat Apr 09, 2018  1305 Patient reports feeling much improved.  She reports chest pressure has resolved to between 0 and 1.  Shortness of breath is improved.  There is a documented, most recent pressure up to 234\215, this is consistent with a machine error.  I repeated the blood pressure and  reading was 143\107, consistent with patient's subjective improvement and medications received.   [MP]    Clinical Course User Index [MP] Arby Barrette, MD   Consult: Isaac Bliss tried hospitalist for admission.  Final Clinical Impressions(s) / ED Diagnoses   Final diagnoses:  Hypertensive emergency  Congestive heart failure, unspecified HF chronicity, unspecified heart failure type [facility](HCC)   Patient presents with insidious shortness of breath that got much worse over the past several days.  She had generalized feeling of chest pressure and tightness.  BNP and chest x-ray consistent with CHF.  Patient is hypertensive on arrival.  She had not taken her a.m. medications.  She describes compliance with her medications however there is some documentation of noncompliance in the EMR and patient did not seem well informed of which medications she is taking.Patient is unaware if she is chronically on a beta-blocker.  She did not recognize the name of Coreg or carvedilol which are listed in her medications.  She also has clonidine listed.  With administration of Lasix, nitroglycerin and aspirin patient had significant improvement of symptoms.  Upon recheck, she is breathing comfortably and reports resolution of chest pressure sensation.  . ED Discharge Orders    None       Arby BarrettePfeiffer, Teal Bontrager, MD 04/09/18 1312    Arby BarrettePfeiffer, Javana Schey, MD 04/09/18 1327

## 2018-04-09 NOTE — ED Notes (Signed)
Walked patient to the bathroom  

## 2018-04-09 NOTE — ED Notes (Signed)
Spoke to Dr. Donnald GarrePfeiffer about repeat I-Stat Lactic. Per MD, we could discontinue the order due to collecting one an hour ago.

## 2018-04-09 NOTE — ED Notes (Signed)
Admitting doctor at bedside 

## 2018-04-09 NOTE — ED Notes (Signed)
Dr. Pfeiffer at bedside   

## 2018-04-09 NOTE — ED Notes (Signed)
Pt ambulated to the bathroom.  

## 2018-04-09 NOTE — H&P (Addendum)
History and Physical    Sabrina Ortiz ZOX:[id number] DOB: 21-Oct-1970 DOA: 04/09/2018  PCP: Sabrina Register, MD  Patient coming from: Home  Chief Complaint: SOB  HPI: Sabrina Ortiz is a 47 y.o. female with medical history significant of GAD, chronic back pain, angioedema/urticaria, allergic rhinitis, HTN who presents for SOB.  She reports worsening SOB for several weeks which she thought was related to her anxiety.  She also noted chest pressure, orthopnea and DOE.  The SOB and chest pressure have improved with lasix and nitro in the ED, but she continues to have DOE and chest pressure on exertion.  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  [name] misunderstood this, and thought Clonidine was prescribed for her HTN and she has only been taking this medication for about 1-2 weeks.  She never picked up the coreg.  She has been on megace for 2 years for frequent periods in the perimenopausal time.  She reports no leg swelling, no leg pain, recent travel for a prolonged period, no surgery or immobilization.  She is not being treated for cancer.  Further symptoms include a cough for about 2 days, mild wheezing and increased stress at home.    ED Course: In the ED, she was noted to have pulmonary edema on CXR and was given lasix and nitroglycerin with improvement in her symptoms.  She was further noted to be tachycardic to the 120s.  She denied palpitations.  Renal function was stable within baseline and she had a mild elevated in her ALT which appears chronic.  BNP was > 1000.  TnI was normal.  EKG revealed sinus tachycardia with prolonged QTc.    Review of Systems: As per HPI otherwise 10 point review of systems negative.    Past Medical History:  Diagnosis Date  . Angio-edema   . Anxiety   . Chronic back pain   . Depression   .  Hypertension   . Mental disorder   . PTSD (post-traumatic stress disorder)   . Urticaria     Past Surgical History:  Procedure Laterality Date  . CYST REMOVAL HAND     Reviewed with patient.   reports that she has never smoked. She has never used smokeless tobacco. She reports that she does not drink alcohol or use drugs.  Allergies  Allergen Reactions  . Shellfish Allergy     Swelling up throat. Eat seafood, her throat may swell up when eat 2-3x a month.    Reviewed with patient.  Family History  Problem Relation Age of Onset  . Breast cancer Mother      Prior to Admission medications   Medication Sig Start Date End Date Taking? Authorizing Provider  cloNIDine (CATAPRES) 0.1 MG tablet Take 1 tablet (0.1 mg total) by mouth at bedtime. 02/22/18  Yes Sabrina Ortiz, Sabrina Horns, MD  megestrol (MEGACE) 40 MG tablet TAKE 1 TABLET BY MOUTH TWICE DAILY. Patient taking differently: TAKE 1 TABLET BY MOUTH once DAILY. 10/02/17  Yes Constant, Peggy, MD  naphazoline-pheniramine (EYE ALLERGY RELIEF) 0.025-0.3 % ophthalmic solution Place 1 drop into both eyes 4 (four) times daily as needed for eye irritation or allergies.   Yes [provider]  omeprazole (PRILOSEC) 20 MG capsule Take 1 capsule (20 mg total) by mouth daily. 02/22/18  Yes Sabrina Register, MD  pravastatin (PRAVACHOL) 40 MG tablet Take  1 tablet (40 mg total) by mouth daily. 02/22/18  Yes Sabrina RegisterNewlin, Enobong, MD  Vitamin D, Ergocalciferol, (DRISDOL) 50000 units CAPS capsule Take 1 capsule (50,000 Units total) by mouth every 7 (seven) days. Patient taking differently: Take 50,000 Units by mouth every Thursday.  01/31/18  Yes Sabrina Ortiz, Sabrina M, PA-C                                              Physical Exam: Constitutional: NAD, calm, sitting up in bed Vitals:   04/09/18 1215 04/09/18 1226 04/09/18 1303 04/09/18 1405  BP: (!) 169/122 (!) 234/215 (!) 143/102 (!) 139/105  Pulse: (!) 136 (!) 136 (!) 119 (!) 125  Resp: (!) 26 (!) 27 14  (!) 29  Temp:      TempSrc:      SpO2: 98% 95% 98% 99%  Weight:      Height:       Eyes:  lids and conjunctivae normal ENMT: Mucous membranes are moist. Sabrina Ortiz in place Neck: normal, supple Respiratory: crackles to mid lung fields, no wheezing noted.   Cardiovascular: Tachycardic, regular, no murmur noted Abdomen: +BS, NT, ND Musculoskeletal: No clubbing or cyanosis, normal muscle tone Skin: no rashes, lesions, ulcers. Some dry skin in the ankles.  + tattoo on right leg Neurologic: Grossly normal, moving all extremities.  Psychiatric: Normal judgment and insight. Alert and oriented x 3. Normal mood.   Labs on Admission: I have personally reviewed following labs and imaging studies  CBC: Recent Labs  Lab 04/09/18 1058  WBC 7.8  HGB 14.1  HCT 45.0  MCV 85.1  PLT 143*   Basic Metabolic Panel: Recent Labs  Lab 04/09/18 1058  NA 140  140  K 3.5  3.5  CL 107  107  CO2 21*  21*  GLUCOSE 139*  142*  BUN 8  7  CREATININE 1.17*  1.15*  CALCIUM 9.2  9.2   GFR: Estimated Creatinine Clearance: 62 mL/min (A) (by C-G formula based on SCr of 1.15 mg/dL (H)). Liver Function Tests: Recent Labs  Lab 04/09/18 1058  AST 36  ALT 52*  ALKPHOS 56  BILITOT 1.2  PROT 7.0  ALBUMIN 3.9   No results for input(s): LIPASE, AMYLASE in the last 168 hours. No results for input(s): AMMONIA in the last 168 hours. Coagulation Profile: Recent Labs  Lab 04/09/18 1058  INR 1.10   Cardiac Enzymes: No results for input(s): CKTOTAL, CKMB, CKMBINDEX, TROPONINI in the last 168 hours. BNP (last 3 results) No results for input(s): PROBNP in the last 8760 hours. HbA1C: No results for input(s): HGBA1C in the last 72 hours. CBG: No results for input(s): GLUCAP in the last 168 hours. Lipid Profile: No results for input(s): CHOL, HDL, LDLCALC, TRIG, CHOLHDL, LDLDIRECT in the last 72 hours. Thyroid Function Tests: No results for input(s): TSH, T4TOTAL, FREET4, T3FREE, THYROIDAB in the last  72 hours. Anemia Panel: No results for input(s): VITAMINB12, FOLATE, FERRITIN, TIBC, IRON, RETICCTPCT in the last 72 hours. Urine analysis:    Component Value Date/Time   COLORURINE COLORLESS (A) 04/09/2018 1350   APPEARANCEUR CLEAR 04/09/2018 1350   LABSPEC 1.003 (L) 04/09/2018 1350   PHURINE 5.0 04/09/2018 1350   GLUCOSEU NEGATIVE 04/09/2018 1350   HGBUR MODERATE (A) 04/09/2018 1350   BILIRUBINUR NEGATIVE 04/09/2018 1350   BILIRUBINUR  01/27/2018 1645     Comment:  500mg /dL   KETONESUR NEGATIVE 16/06/9603 1350   PROTEINUR NEGATIVE 04/09/2018 1350   UROBILINOGEN 1.0 01/27/2018 1645   UROBILINOGEN 0.2 05/12/2013 2020   NITRITE NEGATIVE 04/09/2018 1350   LEUKOCYTESUR NEGATIVE 04/09/2018 1350    Radiological Exams on Admission: Dg Chest Portable 1 View  Result Date: 04/09/2018 CLINICAL DATA:  Diffuse chest pain and shortness of breath since yesterday. EXAM: PORTABLE CHEST 1 VIEW COMPARISON:  Chest x-ray dated 03/14/2013. FINDINGS: Mild cardiomegaly. Central pulmonary vascular congestion. Patchy bibasilar opacities, likely combination of atelectasis, edema and small pleural effusions. No pneumothorax seen. No acute or suspicious osseous finding. IMPRESSION: Cardiomegaly with central pulmonary vascular congestion indicating CHF/volume overload. Associated opacities at the lung bases which are likely a combination of associated edema, atelectasis and small pleural effusions. Electronically Signed   By: Bary Richard Ortiz.D.   On: 04/09/2018 12:22    EKG: Independently reviewed. Sinus tach with QTc prolonged   Assessment/Plan Acute pulmonary edema, tachycardia, SOB - DDx - flash pulmonary edema due to HTN, pulmonary embolism, MI - Geneva score 5, Wells 1.5 - low to mod risk - check a d dimer, hold megace - lasix given in the ED, check AM CXR - Unclear reason for tachycardia, she had not started beta blocker outpatient, previous vitals at appointments show highest HR of 102 - Repeat  EKG, trend troponin given persistent chest pressure - I/O - Daily weight - IV lasix 20 daily, she is lasix naive - Clonidine started, only home medication she was taking for BP - TTE - Hydralazine PO prn for SBP > 180 - Denies illicit drug use such as cocaine - Check TSH  Generalized anxiety disorder - On hydroxyzine at home which she does not feel is adequately controlling her symptoms.  Continue for now  Essential hypertension - BP very high when she came in, she was only taking clonidine and she reports no missed doses - Restart clonidine - Lasix as above - Hydralazine PRN for SBP > 180    Vitamin D deficiency - Continue home supplementation  Transaminitis - Mild ALT elevation - Can consider repeat in the clinic and work up as an outpatient - Trend with CMET, ensure levels not rising  Prolonged QTc - Hydroxyzine can cause prolonged QTc but usually in combo with other agents that prolong - hold for now given finding - Repeat EKG - Check Magnesium  Hemoglobinuria - Repeat UA in the AM for confirmation, work up based on that finding.       DVT prophylaxis: Lovenox Code Status: Full Disposition Plan: Admit for diuresis and work up of pulmonary edema  Consults called: None Admission status: Inpatient, telemetry   Debe Coder MD [facility] Pager [phone]  If 7PM-7AM, please contact night-coverage [email or web address] Password [email or web address]  04/09/2018, 2:29 PM

## 2018-04-09 NOTE — ED Triage Notes (Signed)
Pt c/o insomnia that has caused shortness of breath and chest pain for 2 weeks-- pt is uncomfortable-- has to lean forward to breath easier.

## 2018-04-09 NOTE — ED Notes (Signed)
EDP at bedside  

## 2018-04-10 ENCOUNTER — Inpatient Hospital Stay (HOSPITAL_COMMUNITY): Payer: Medicare Other

## 2018-04-10 DIAGNOSIS — I161 Hypertensive emergency: Secondary | ICD-10-CM

## 2018-04-10 DIAGNOSIS — J810 Acute pulmonary edema: Secondary | ICD-10-CM

## 2018-04-10 LAB — BASIC METABOLIC PANEL
ANION GAP: 16 — AB (ref 5–15)
BUN: 10 mg/dL (ref 6–20)
CHLORIDE: 104 mmol/L (ref 98–111)
CO2: 19 mmol/L — AB (ref 22–32)
Calcium: 8.9 mg/dL (ref 8.9–10.3)
Creatinine, Ser: 1.16 mg/dL — ABNORMAL HIGH (ref 0.44–1.00)
GFR calc non Af Amer: 55 mL/min — ABNORMAL LOW (ref >60)
GLUCOSE: 127 mg/dL — AB (ref 70–99)
Potassium: 3.5 mmol/L (ref 3.5–5.1)
SODIUM: 139 mmol/L (ref 135–145)

## 2018-04-10 LAB — CBC
HCT: 43.8 % (ref 36.0–46.0)
HEMOGLOBIN: 13.9 g/dL (ref 12.0–15.0)
MCH: 26.5 pg (ref 26.0–34.0)
MCHC: 31.7 g/dL (ref 30.0–36.0)
MCV: 83.4 fL (ref 78.0–100.0)
Platelets: 132 10*3/uL — ABNORMAL LOW (ref 150–400)
RBC: 5.25 MIL/uL — AB (ref 3.87–5.11)
RDW: 15.3 % (ref 11.5–15.5)
WBC: 6.5 10*3/uL (ref 4.0–10.5)

## 2018-04-10 LAB — ECHOCARDIOGRAM COMPLETE
Height: 65 in
Weight: 2636.8 oz

## 2018-04-10 LAB — TROPONIN I: TROPONIN I: 0.04 ng/mL — AB (ref <0.03)

## 2018-04-10 LAB — HIV ANTIBODY (ROUTINE TESTING W REFLEX): HIV Screen 4th Generation wRfx: NONREACTIVE

## 2018-04-10 MED ORDER — CARVEDILOL 3.125 MG PO TABS
3.1250 mg | ORAL_TABLET | Freq: Two times a day (BID) | ORAL | Status: DC
Start: 2018-04-10 — End: 2018-04-10

## 2018-04-10 MED ORDER — CARVEDILOL 12.5 MG PO TABS: 12.5000 mg | ORAL_TABLET | Freq: Two times a day (BID) | ORAL | Status: DC

## 2018-04-10 MED ORDER — HYDROXYZINE HCL 25 MG PO TABS: 25.0000 mg | ORAL_TABLET | Freq: Three times a day (TID) | ORAL | Status: DC

## 2018-04-10 MED ORDER — DOBUTAMINE IN D5W 4-5 MG/ML-% IV SOLN
2.5000 ug/kg/min | INTRAVENOUS | Status: DC
  Administered 2018-04-10: 5 ug/kg/min via INTRAVENOUS
  Administered 2018-04-12: 2.5 ug/kg/min via INTRAVENOUS
  Filled 2018-04-10 (×2): qty 250

## 2018-04-10 MED ORDER — SODIUM CHLORIDE 0.9 % IV SOLN
INTRAVENOUS | Status: DC | PRN
  Administered 2018-04-10: 500 mL via INTRAVENOUS

## 2018-04-10 MED ORDER — POTASSIUM CHLORIDE ER 10 MEQ PO TBCR
10.0000 meq | EXTENDED_RELEASE_TABLET | Freq: Every day | ORAL | Status: DC
  Administered 2018-04-10: 10 meq via ORAL
  Filled 2018-04-10 (×3): qty 1

## 2018-04-10 MED ORDER — ATORVASTATIN CALCIUM 40 MG PO TABS
40.0000 mg | ORAL_TABLET | Freq: Every day | ORAL | Status: DC
Start: 2018-04-10 — End: 2018-04-14
  Administered 2018-04-10 – 2018-04-13 (×4): 40 mg via ORAL
  Filled 2018-04-10 (×4): qty 1

## 2018-04-10 MED ORDER — TRAZODONE HCL 50 MG PO TABS: 50.0000 mg | ORAL_TABLET | Freq: Every day | ORAL | Status: DC

## 2018-04-10 MED ORDER — PERFLUTREN LIPID MICROSPHERE
4.0000 mL | INTRAVENOUS | Status: AC | PRN
Start: 2018-04-10 — End: 2018-04-10
  Administered 2018-04-10: 4 mL via INTRAVENOUS
  Filled 2018-04-10: qty 4

## 2018-04-10 MED ORDER — CARVEDILOL 3.125 MG PO TABS
3.1250 mg | ORAL_TABLET | Freq: Two times a day (BID) | ORAL | Status: DC
  Administered 2018-04-10 – 2018-04-14 (×8): 3.125 mg via ORAL
  Filled 2018-04-10 (×7): qty 1

## 2018-04-10 MED ORDER — LOSARTAN POTASSIUM 25 MG PO TABS
25.0000 mg | ORAL_TABLET | Freq: Every day | ORAL | Status: DC
  Administered 2018-04-10 – 2018-04-12 (×3): 25 mg via ORAL
  Filled 2018-04-10 (×3): qty 1

## 2018-04-10 MED ORDER — DILTIAZEM HCL ER COATED BEADS 120 MG PO CP24
120.0000 mg | ORAL_CAPSULE | Freq: Every day | ORAL | Status: DC
  Administered 2018-04-10: 120 mg via ORAL
  Filled 2018-04-10: qty 1

## 2018-04-10 MED ORDER — FUROSEMIDE 10 MG/ML IJ SOLN
40.0000 mg | Freq: Once | INTRAMUSCULAR | Status: AC
  Administered 2018-04-10: 40 mg via INTRAVENOUS
  Filled 2018-04-10: qty 4

## 2018-04-10 NOTE — Plan of Care (Signed)
Spent time reviewing meds, what the purpose of her diuretic is and that she will need to be careful of her fluid intake so that she doesn't undo what we are doing with the medication, will give her the Living with [facility]ai[facility] booklet to start reviewing medications, diet, exercise and symptoms to be reported to her healthcare provider and will continue to reinforce the information.

## 2018-04-10 NOTE — Consult Note (Signed)
Referring Physician:  FAUSTINA GEBERT is an 47 y.o. female.                       Chief Complaint: Shortness of breath and chest pain  HPI: 47 year old female with 3 to 4 day history of shortness of breath with exertion and diffuse heaviness in chest. She is also tachycardic and admits to chronic anxiety from family situation. Her BNP was 1,027.2, chest x-ray and CT chest was positive for cardiomegaly and pulmonary edema. She feels little better today post diuresis.  Past Medical History:  Diagnosis Date  . Angio-edema   . Anxiety   . Chronic back pain   . Depression   . Hypertension   . Mental disorder   . PTSD (post-traumatic stress disorder)   . Urticaria       Past Surgical History:  Procedure Laterality Date  . CYST REMOVAL HAND      Family History  Problem Relation Age of Onset  . Breast cancer Mother    Social History:  reports that she has never smoked. She has never used smokeless tobacco. She reports that she does not drink alcohol or use drugs.  Allergies:  Allergies  Allergen Reactions  . Shellfish Allergy     Swelling up throat. Eat seafood, her throat may swell up when eat 2-3x a month.     Medications Prior to Admission  Medication Sig Dispense Refill  . cloNIDine (CATAPRES) 0.1 MG tablet Take 1 tablet (0.1 mg total) by mouth at bedtime. 30 tablet 3  . megestrol (MEGACE) 40 MG tablet TAKE 1 TABLET BY MOUTH TWICE DAILY. (Patient taking differently: TAKE 1 TABLET BY MOUTH once DAILY.) 60 tablet 3  . Melatonin 3 MG CAPS Take by mouth.    . naphazoline-pheniramine (EYE ALLERGY RELIEF) 0.025-0.3 % ophthalmic solution Place 1 drop into both eyes 4 (four) times daily as needed for eye irritation or allergies.    Marland Kitchen omeprazole (PRILOSEC) 20 MG capsule Take 1 capsule (20 mg total) by mouth daily. 30 capsule 5  . pravastatin (PRAVACHOL) 40 MG tablet Take 1 tablet (40 mg total) by mouth daily. 30 tablet 5  . Vitamin D, Ergocalciferol, (DRISDOL) 50000 units CAPS  capsule Take 1 capsule (50,000 Units total) by mouth every 7 (seven) days. (Patient taking differently: Take 50,000 Units by mouth every Thursday. ) 16 capsule 0  . carvedilol (COREG) 6.25 MG tablet Take 1 tablet (6.25 mg total) by mouth 2 (two) times daily with a meal. (Patient not taking: Reported on 04/09/2018) 60 tablet 3  . fluticasone (FLONASE) 50 MCG/ACT nasal spray 1 spray in each nostril 1-2 times daily as needed (Patient not taking: Reported on 04/09/2018) 16 g 5  . hydrOXYzine (ATARAX/VISTARIL) 25 MG tablet Take 1 tablet (25 mg total) by mouth 3 (three) times daily as needed. (Patient not taking: Reported on 04/09/2018) 90 tablet 0  . levocetirizine (XYZAL) 5 MG tablet Take 1 tablet (5 mg total) by mouth every evening. (Patient not taking: Reported on 04/09/2018) 30 tablet 3  . lisinopril-hydrochlorothiazide (PRINZIDE,ZESTORETIC) 20-12.5 MG tablet Take 1 tablet by mouth daily. (Patient not taking: Reported on 04/09/2018) 30 tablet 5  . traZODone (DESYREL) 50 MG tablet Take 1 tablet (50 mg total) by mouth at bedtime. (Patient not taking: Reported on 04/09/2018) 30 tablet 3    Results for orders placed or performed during the hospital encounter of 04/09/18 (from the past 48 hour(s))  Basic metabolic panel  Status: Abnormal   Collection Time: 04/09/18 10:58 AM  Result Value Ref Range   Sodium 140 135 - 145 mmol/L   Potassium 3.5 3.5 - 5.1 mmol/L   Chloride 107 98 - 111 mmol/L   CO2 21 (L) 22 - 32 mmol/L   Glucose, Bld 142 (H) 70 - 99 mg/dL   BUN 7 6 - 20 mg/dL   Creatinine, Ser 1.15 (H) 0.44 - 1.00 mg/dL   Calcium 9.2 8.9 - 10.3 mg/dL   GFR calc non Af Amer 56 (L) >60 mL/min   GFR calc Af Amer >60 >60 mL/min    Comment: (NOTE) The eGFR has been calculated using the CKD EPI equation. This calculation has not been validated in all clinical situations. eGFR's persistently <60 mL/min signify possible Chronic Kidney Disease.    Anion gap 12 5 - 15    Comment: Performed at [facility] [address], [place], [place] [address]  CBC     Status: Abnormal   Collection Time: 04/09/18 10:58 AM  Result Value Ref Range   WBC 7.8 4.0 - 10.5 K/uL   RBC 5.29 (H) 3.87 - 5.11 MIL/uL   Hemoglobin 14.1 12.0 - 15.0 g/dL   HCT 45.0 36.0 - 46.0 %   MCV 85.1 78.0 - 100.0 fL   MCH 26.7 26.0 - 34.0 pg   MCHC 31.3 30.0 - 36.0 g/dL   RDW 15.5 11.5 - 15.5 %   Platelets 143 (L) 150 - 400 K/uL    Comment: Performed at [facility], [place] [address] [place], [place] [address]  Comprehensive metabolic panel     Status: Abnormal   Collection Time: 04/09/18 10:58 AM  Result Value Ref Range   Sodium 140 135 - 145 mmol/L   Potassium 3.5 3.5 - 5.1 mmol/L   Chloride 107 98 - 111 mmol/L   CO2 21 (L) 22 - 32 mmol/L   Glucose, Bld 139 (H) 70 - 99 mg/dL   BUN 8 6 - 20 mg/dL   Creatinine, Ser 1.17 (H) 0.44 - 1.00 mg/dL   Calcium 9.2 8.9 - 10.3 mg/dL   Total Protein 7.0 6.5 - 8.1 g/dL   Albumin 3.9 3.5 - 5.0 g/dL   AST 36 15 - 41 U/L   ALT 52 (H) 0 - 44 U/L   Alkaline Phosphatase 56 38 - 126 U/L   Total Bilirubin 1.2 0.3 - 1.2 mg/dL   GFR calc non Af Amer 55 (L) >60 mL/min   GFR calc Af Amer >60 >60 mL/min    Comment: (NOTE) The eGFR has been calculated using the CKD EPI equation. This calculation has not been validated in all clinical situations. eGFR's persistently <60 mL/min signify possible Chronic Kidney Disease.    Anion gap 12 5 - 15    Comment: Performed at [facility] [address] [place], [place] [address]  Brain natriuretic peptide     Status: Abnormal   Collection Time: 04/09/18 10:58 AM  Result Value Ref Range   B Natriuretic Peptide 1,027.2 (H) 0.0 - 100.0 pg/mL    Comment: Performed at [facility] [address], [place], [place] [address]  Protime-INR     Status: None   Collection Time: 04/09/18 10:58 AM  Result Value Ref Range   Prothrombin Time 14.1 11.4 - 15.2 seconds   INR 1.10     Comment: Performed at [facility] [address] [place], [place] [address]  I-stat troponin, ED     Status: None   Collection Time: 04/09/18 11:47 AM  Result Value Ref Range   Troponin i, poc 0.01 0.00 - 0.08 ng/mL   Comment 3            Comment: Due to the release kinetics of cTnI, a negative result within the first hours of the onset of symptoms does not rule out myocardial infarction with certainty. If myocardial infarction is still suspected, repeat the test at appropriate intervals.   I-Stat beta hCG blood, ED     Status: None   Collection Time: 04/09/18 11:48 AM  Result Value Ref Range   I-stat hCG, quantitative <5.0 <5 mIU/mL   Comment 3            Comment:   GEST. AGE      CONC.  (mIU/mL)   <=1 WEEK        5 - 50     2 WEEKS       50 - 500     3 WEEKS       100 - 10,000     4 WEEKS     1,000 - 30,000        FEMALE AND NON-PREGNANT FEMALE:     LESS THAN 5 mIU/mL   I-Stat CG4 Lactic Acid, ED     Status: None   Collection Time: 04/09/18 12:29 PM  Result Value Ref Range   Lactic Acid, Venous 1.50 0.5 - 1.9 mmol/L  Urinalysis, Routine w reflex microscopic     Status: Abnormal   Collection Time: 04/09/18  1:50 PM  Result Value Ref Range   Color, Urine COLORLESS (A) YELLOW   APPearance CLEAR CLEAR   Specific Gravity, Urine 1.003 (L) 1.005 - 1.030   pH 5.0 5.0 - 8.0   Glucose, UA NEGATIVE NEGATIVE mg/dL   Hgb urine dipstick MODERATE (A) NEGATIVE   Bilirubin Urine NEGATIVE NEGATIVE   Ketones, ur NEGATIVE NEGATIVE mg/dL   Protein, ur NEGATIVE NEGATIVE mg/dL   Nitrite NEGATIVE NEGATIVE   Leukocytes, UA NEGATIVE NEGATIVE   Bacteria, UA NONE SEEN NONE SEEN   Squamous Epithelial / LPF 0-5 0 - 5   Hyaline Casts, UA PRESENT     Comment: Performed at [facility], [place] [address] [place], [place] [address]  Urine rapid drug screen (hosp performed)     Status: None   Collection Time: 04/09/18  1:50 PM  Result Value Ref Range   Opiates NONE DETECTED NONE DETECTED   Cocaine NONE DETECTED NONE DETECTED   Benzodiazepines  NONE DETECTED NONE DETECTED   Amphetamines NONE DETECTED NONE DETECTED   Tetrahydrocannabinol NONE DETECTED NONE DETECTED   Barbiturates NONE DETECTED NONE DETECTED    Comment: (NOTE) DRUG SCREEN FOR MEDICAL PURPOSES ONLY.  IF CONFIRMATION IS NEEDED FOR ANY PURPOSE, NOTIFY LAB WITHIN 5 DAYS. LOWEST DETECTABLE LIMITS FOR URINE DRUG SCREEN Drug Class                     Cutoff (ng/mL) Amphetamine and metabolites    1000 Barbiturate and metabolites    200 Benzodiazepine                 683 Tricyclics and metabolites     300 Opiates and metabolites        300 Cocaine and metabolites        300 THC  50 Performed at [facility], [place] [address], [place], [place] [address]   D-dimer, quantitative (not at [facility])     Status: Abnormal   Collection Time: 04/09/18  3:52 PM  Result Value Ref Range   D-Dimer, Quant 0.60 (H) 0.00 - 0.50 ug/mL-FEU    Comment: (NOTE) At the manufacturer cut-off of 0.50 ug/mL FEU, this assay has been documented to exclude PE with a sensitivity and negative predictive value of 97 to 99%.  At this time, this assay has not been approved by the FDA to exclude DVT/VTE. Results should be correlated with clinical presentation. Performed at [facility], [place] [address] [place], [place] [address]   HIV antibody (Routine Testing)     Status: None   Collection Time: 04/09/18  3:52 PM  Result Value Ref Range   HIV Screen 4th Generation wRfx Non Reactive Non Reactive    Comment: (NOTE) Performed At: [facility] [place], [place] [id number] Rush Farmer MD [id number]   TSH     Status: None   Collection Time: 04/09/18  3:52 PM  Result Value Ref Range   TSH 2.345 0.350 - 4.500 uIU/mL    Comment: Performed by a 3rd Generation assay with a functional sensitivity of <=0.01 uIU/mL. Performed at [facility], [place] [address], [place], [place] [address]   Troponin I (q 6hr x 3)     Status: None    Collection Time: 04/09/18  3:52 PM  Result Value Ref Range   Troponin I <0.03 <0.03 ng/mL    Comment: Performed at [facility] [address] [place], [place] [address]  Magnesium     Status: None   Collection Time: 04/09/18  3:52 PM  Result Value Ref Range   Magnesium 2.1 1.7 - 2.4 mg/dL    Comment: Performed at [facility] [address], [place], [place] [address]  Troponin I (q 6hr x 3)     Status: None   Collection Time: 04/09/18 10:14 PM  Result Value Ref Range   Troponin I <0.03 <0.03 ng/mL    Comment: Performed at [facility] [address], [place], [place] [address]  Troponin I (q 6hr x 3)     Status: Abnormal   Collection Time: 04/10/18  4:29 AM  Result Value Ref Range   Troponin I 0.04 (HH) <0.03 ng/mL    Comment: CRITICAL RESULT CALLED TO, READ BACK BY AND VERIFIED WITH: RODGERS D,RN 04/10/18 0631 WAYK Performed at [facility], [place] [address], [place], [place] [address]   Basic metabolic panel     Status: Abnormal   Collection Time: 04/10/18  4:29 AM  Result Value Ref Range   Sodium 139 135 - 145 mmol/L   Potassium 3.5 3.5 - 5.1 mmol/L    Comment: SLIGHT HEMOLYSIS   Chloride 104 98 - 111 mmol/L   CO2 19 (L) 22 - 32 mmol/L   Glucose, Bld 127 (H) 70 - 99 mg/dL   BUN 10 6 - 20 mg/dL   Creatinine, Ser 1.16 (H) 0.44 - 1.00 mg/dL   Calcium 8.9 8.9 - 10.3 mg/dL   GFR calc non Af Amer 55 (L) >60 mL/min   GFR calc Af Amer >60 >60 mL/min    Comment: (NOTE) The eGFR has been calculated using the CKD EPI equation. This calculation has not been validated in all clinical situations. eGFR's persistently <60 mL/min signify possible Chronic Kidney Disease.  Anion gap 16 (H) 5 - 15    Comment: Performed at [facility], [place] [address], [place], [place] [address]  CBC     Status: Abnormal   Collection Time: 04/10/18  4:29 AM  Result Value Ref Range   WBC 6.5 4.0 - 10.5 K/uL   RBC 5.25 (H) 3.87 - 5.11 MIL/uL   Hemoglobin 13.9 12.0 -  15.0 g/dL   HCT 43.8 36.0 - 46.0 %   MCV 83.4 78.0 - 100.0 fL   MCH 26.5 26.0 - 34.0 pg   MCHC 31.7 30.0 - 36.0 g/dL   RDW 15.3 11.5 - 15.5 %   Platelets 132 (L) 150 - 400 K/uL    Comment: PLATELET COUNT CONFIRMED BY SMEAR Performed at [facility], [place] [address], [place], [place] [address]    Ct Angio Chest Pe W Or Wo Contrast  Result Date: 04/09/2018 CLINICAL DATA:  Shortness of breath increasing for the past couple of weeks. Was gradual but much worse over past 3 to 4 days. Patient reports she has a diffuse feeling of heaviness in her chest. Feels a bit like heartburn. EXAM: CT ANGIOGRAPHY CHEST WITH CONTRAST TECHNIQUE: Multidetector CT imaging of the chest was performed using the standard protocol during bolus administration of intravenous contrast. Multiplanar CT image reconstructions and MIPs were obtained to evaluate the vascular anatomy. CONTRAST:  144m ISOVUE-370 IOPAMIDOL (ISOVUE-370) INJECTION 76% COMPARISON:  Chest x-ray on 04/09/2018 FINDINGS: Cardiovascular: The heart is mildly enlarged. There are is minimal atherosclerotic calcification of the thoracic aorta. No aneurysm. No significant coronary artery calcifications. No pericardial effusion. The pulmonary arteries are well opacified by contrast bolus in there is no acute pulmonary embolus. Mediastinum/Nodes: Thyroid is normal in appearance. Esophagus is normal in appearance. No mediastinal, hilar, or axillary adenopathy. Lungs/Pleura: There are bilateral pleural effusions. There is smooth septal thickening and ground-glass densities with a dependent gradient consistent with pulmonary edema. No suspicious pulmonary nodules. No consolidations. Upper Abdomen: Diffuse low-attenuation of the liver is consistent with hepatic steatosis. Musculoskeletal: No chest wall abnormality. No acute or significant osseous findings. Review of the MIP images confirms the above findings. IMPRESSION: 1. Cardiomegaly and pulmonary edema.  Bilateral  pleural effusions. 2. No evidence for acute pulmonary embolus. 3. Hepatic steatosis. 4.  Aortic Atherosclerosis (ICD10-I70.0). Electronically Signed   By: ENolon NationsM.D.   On: 04/09/2018 19:01   Dg Chest Port 1 View  Result Date: 04/10/2018 CLINICAL DATA:  Acute pulmonary edema EXAM: PORTABLE CHEST 1 VIEW COMPARISON:  04/09/2018 FINDINGS: Cardiac shadow is enlarged but accentuated by the portable technique. The lungs are well aerated bilaterally with improved aeration in the bases. Some slight reduction in the pleural effusions seen on prior CT are noted. No bony abnormality is seen. IMPRESSION: Improved aeration when compare with the previous day. Electronically Signed   By: MInez CatalinaM.D.   On: 04/10/2018 07:58   Dg Chest Portable 1 View  Result Date: 04/09/2018 CLINICAL DATA:  Diffuse chest pain and shortness of breath since yesterday. EXAM: PORTABLE CHEST 1 VIEW COMPARISON:  Chest x-ray dated 03/14/2013. FINDINGS: Mild cardiomegaly. Central pulmonary vascular congestion. Patchy bibasilar opacities, likely combination of atelectasis, edema and small pleural effusions. No pneumothorax seen. No acute or suspicious osseous finding. IMPRESSION: Cardiomegaly with central pulmonary vascular congestion indicating CHF/volume overload. Associated opacities at the lung bases which are likely a combination of associated edema, atelectasis and small pleural effusions. Electronically Signed   By: SRoxy HorsemanD.  On: 04/09/2018 12:22    Review Of Systems Constitutional: No fever, chills, weight loss or gain. Eyes: No vision change, wears glasses. No discharge or pain. Ears: No hearing loss, No tinnitus. Respiratory: No asthma, COPD, pneumonias. Positive shortness of breath. No hemoptysis. Cardiovascular: Positive chest pain, palpitation, no leg edema. Gastrointestinal: No nausea, vomiting, diarrhea, constipation. No GI bleed. No hepatitis. Genitourinary: No dysuria, hematuria, kidney stone. No  incontinance. Neurological: No headache, stroke, seizures.  Psychiatry: No psych facility admission for anxiety, depression, suicide. No detox. Skin: No rash. Musculoskeletal: Positive joint pain, fibromyalgia. No neck pain, back pain. Lymphadenopathy: No lymphadenopathy. Hematology: No anemia or easy bruising.   Blood pressure (!) 122/97, pulse (!) 124, temperature 99.9 F (37.7 C), temperature source Oral, resp. rate 20, height 5' 5" (1.651 m), weight 74.8 kg (164 lb 12.8 oz), SpO2 95 %. Body mass index is 27.42 kg/m. General appearance: alert, cooperative, appears stated age and no distress Head: Normocephalic, atraumatic. Eyes: Brown eyes, pink conjunctiva, corneas clear.  Neck: No adenopathy, no carotid bruit, no JVD, supple, symmetrical, trachea midline and thyroid not enlarged. Resp: Clearing to auscultation bilaterally. Cardio: Regular rate and rhythm, S1, S2 normal, II/VI systolic murmur, no click, rub or gallop GI: Soft, non-tender; bowel sounds normal; no organomegaly. Extremities: No edema, cyanosis or clubbing. Skin: Warm and dry.  Neurologic: Alert and oriented X 3, normal strength. Normal coordination.  Assessment/Plan Acute systolic left heart failure R/O CAD  Hypertension Hyperlipidemia Anxiety, chronic  Agree with diuresis. Echocardiogram for LV function. Consider left and right heart cath.   Birdie Riddle, MD  04/10/2018, 11:46 AM

## 2018-04-10 NOTE — Consult Note (Signed)
Echocardiogram shows poor LV systolic function without RV dysfunction.  Will discontinue diltiazem. IV dobutamine 5 mcg/kg/min for inotropic support in patient with hypotension.

## 2018-04-10 NOTE — Progress Notes (Signed)
Patient care assumed by Dr. Garnetta BuddyKadakia----greatly appreciate assistance  Pleas KochJai Almon Whitford, MD [facility] 4:17 PM

## 2018-04-11 ENCOUNTER — Encounter (HOSPITAL_COMMUNITY): Payer: Self-pay

## 2018-04-11 ENCOUNTER — Other Ambulatory Visit: Payer: Self-pay

## 2018-04-11 LAB — URINALYSIS, ROUTINE W REFLEX MICROSCOPIC
BILIRUBIN URINE: NEGATIVE
GLUCOSE, UA: 150 mg/dL — AB
Ketones, ur: NEGATIVE mg/dL
LEUKOCYTES UA: NEGATIVE
NITRITE: NEGATIVE
PH: 6 (ref 5.0–8.0)
Protein, ur: 30 mg/dL — AB
SPECIFIC GRAVITY, URINE: 1.028 (ref 1.005–1.030)

## 2018-04-11 LAB — BASIC METABOLIC PANEL
ANION GAP: 9 (ref 5–15)
BUN: 10 mg/dL (ref 6–20)
CALCIUM: 8.8 mg/dL — AB (ref 8.9–10.3)
CO2: 27 mmol/L (ref 22–32)
Chloride: 103 mmol/L (ref 98–111)
Creatinine, Ser: 1.23 mg/dL — ABNORMAL HIGH (ref 0.44–1.00)
GFR, EST AFRICAN AMERICAN: 60 mL/min — AB (ref >60)
GFR, EST NON AFRICAN AMERICAN: 51 mL/min — AB (ref >60)
Glucose, Bld: 126 mg/dL — ABNORMAL HIGH (ref 70–99)
Potassium: 3 mmol/L — ABNORMAL LOW (ref 3.5–5.1)
Sodium: 139 mmol/L (ref 135–145)

## 2018-04-11 MED ORDER — SODIUM CHLORIDE 0.9 % IV SOLN: INTRAVENOUS | Status: DC | PRN

## 2018-04-11 MED ORDER — ASPIRIN 81 MG PO CHEW
81.0000 mg | CHEWABLE_TABLET | ORAL | Status: AC
  Administered 2018-04-12: 81 mg via ORAL
  Filled 2018-04-11: qty 1

## 2018-04-11 MED ORDER — MAGNESIUM OXIDE 400 (241.3 MG) MG PO TABS
400.0000 mg | ORAL_TABLET | Freq: Every day | ORAL | Status: DC
  Administered 2018-04-11 – 2018-04-13 (×3): 400 mg via ORAL
  Filled 2018-04-11 (×3): qty 1

## 2018-04-11 MED ORDER — POTASSIUM CHLORIDE CRYS ER 10 MEQ PO TBCR
20.0000 meq | EXTENDED_RELEASE_TABLET | Freq: Three times a day (TID) | ORAL | Status: DC
  Administered 2018-04-11 – 2018-04-13 (×7): 20 meq via ORAL
  Filled 2018-04-11 (×12): qty 2

## 2018-04-11 MED ORDER — SODIUM CHLORIDE 0.9% FLUSH
3.0000 mL | Freq: Two times a day (BID) | INTRAVENOUS | Status: DC
  Administered 2018-04-12: 3 mL via INTRAVENOUS

## 2018-04-11 MED ORDER — SODIUM CHLORIDE 0.9 % IV SOLN
INTRAVENOUS | Status: DC
  Administered 2018-04-11: via INTRAVENOUS

## 2018-04-11 MED ORDER — SODIUM CHLORIDE 0.9 % IV SOLN: 250.0000 mL | INTRAVENOUS | Status: DC | PRN

## 2018-04-11 MED ORDER — SODIUM CHLORIDE 0.9% FLUSH
3.0000 mL | INTRAVENOUS | Status: DC | PRN
Start: 2018-04-11 — End: 2018-04-12

## 2018-04-11 MED ORDER — SODIUM CHLORIDE 0.9 % IV SOLN: INTRAVENOUS | Status: AC | PRN

## 2018-04-11 NOTE — H&P (View-Only) (Signed)
Ref: Newlin, Enobong, MD   Subjective:  Awake. Breathing is improving. Mild tachycardia on inotropic support. Echocardiogram showed poor LV systolic and preserved RV systolic function.  Objective:  Vital Signs in the last 24 hours: Temp:  [98.4 F (36.9 C)-98.7 F (37.1 C)] 98.7 F (37.1 C) (08/05 0431) Pulse Rate:  [93-111] 111 (08/05 0431) Cardiac Rhythm: Normal sinus rhythm (08/05 0709) Resp:  [20] 20 (08/05 0431) BP: (93-124)/(63-86) 118/82 (08/05 0746) SpO2:  [90 %-100 %] 100 % (08/05 0431) Weight:  [74.1 kg (163 lb 6.4 oz)] 74.1 kg (163 lb 6.4 oz) (08/05 0431)  Physical Exam: BP Readings from Last 1 Encounters:  04/11/18 118/82     Wt Readings from Last 1 Encounters:  04/11/18 74.1 kg (163 lb 6.4 oz)    Weight change: -2.54 kg (-5 lb 9.6 oz) Body mass index is 27.19 kg/m. HEENT: [place]/AT, Eyes-Brown, PERL, EOMI, Conjunctiva-Pink, Sclera-Non-icteric Neck: No JVD, No bruit, Trachea midline. Lungs:  Clear, Bilateral. Cardiac:  Regular rhythm, normal S1 and S2, no S3. II/VI systolic murmur. Abdomen:  Soft, non-tender. BS present. Extremities:  No edema present. No cyanosis. No clubbing. CNS: AxOx3, Cranial nerves grossly intact, moves all 4 extremities.  Skin: Warm and dry.   Intake/Output from previous day: 08/04 0701 - 08/05 0700 In: 1058.4 [P.O.:840; I.V.:218.4] Out: 2300 [Urine:2300]    Lab Results: BMET    Component Value Date/Time   NA 139 04/11/2018 0501   NA 139 04/10/2018 0429   NA 140 04/09/2018 1058   NA 140 04/09/2018 1058   NA 140 03/29/2018 1540   NA 142 01/27/2018 1638   NA 141 03/31/2017 1435   K 3.0 (L) 04/11/2018 0501   K 3.5 04/10/2018 0429   K 3.5 04/09/2018 1058   K 3.5 04/09/2018 1058   CL 103 04/11/2018 0501   CL 104 04/10/2018 0429   CL 107 04/09/2018 1058   CL 107 04/09/2018 1058   CO2 27 04/11/2018 0501   CO2 19 (L) 04/10/2018 0429   CO2 21 (L) 04/09/2018 1058   CO2 21 (L) 04/09/2018 1058   GLUCOSE 126 (H) 04/11/2018 0501   GLUCOSE 127 (H) 04/10/2018 0429   GLUCOSE 142 (H) 04/09/2018 1058   GLUCOSE 139 (H) 04/09/2018 1058   BUN 10 04/11/2018 0501   BUN 10 04/10/2018 0429   BUN 7 04/09/2018 1058   BUN 8 04/09/2018 1058   BUN 13 03/29/2018 1540   BUN 13 01/27/2018 1638   BUN 10 03/31/2017 1435   CREATININE 1.23 (H) 04/11/2018 0501   CREATININE 1.16 (H) 04/10/2018 0429   CREATININE 1.15 (H) 04/09/2018 1058   CREATININE 1.17 (H) 04/09/2018 1058   CREATININE 1.18 (H) 12/05/2015 1506   CREATININE 0.94 11/21/2014 1505   CREATININE 1.00 05/03/2014 1220   CALCIUM 8.8 (L) 04/11/2018 0501   CALCIUM 8.9 04/10/2018 0429   CALCIUM 9.2 04/09/2018 1058   CALCIUM 9.2 04/09/2018 1058   GFRNONAA 51 (L) 04/11/2018 0501   GFRNONAA 55 (L) 04/10/2018 0429   GFRNONAA 56 (L) 04/09/2018 1058   GFRNONAA 55 (L) 04/09/2018 1058   GFRNONAA 75 11/21/2014 1505   GFRNONAA 69 05/03/2014 1220   GFRAA 60 (L) 04/11/2018 0501   GFRAA >60 04/10/2018 0429   GFRAA >60 04/09/2018 1058   GFRAA >60 04/09/2018 1058   GFRAA 86 11/21/2014 1505   GFRAA 80 05/03/2014 1220   CBC    Component Value Date/Time   WBC 6.5 04/10/2018 0429   RBC 5.25 (H) 04/10/2018 0429     HGB 13.9 04/10/2018 0429   HGB 14.7 03/29/2018 1540   HCT 43.8 04/10/2018 0429   HCT 45.6 03/29/2018 1540   PLT 132 (L) 04/10/2018 0429   PLT 164 03/29/2018 1540   MCV 83.4 04/10/2018 0429   MCV 81 03/29/2018 1540   MCH 26.5 04/10/2018 0429   MCHC 31.7 04/10/2018 0429   RDW 15.3 04/10/2018 0429   RDW 14.6 03/29/2018 1540   LYMPHSABS 2.1 03/29/2018 1540   MONOABS 0.3 12/05/2015 1506   EOSABS 0.6 (H) 03/29/2018 1540   BASOSABS 0.1 03/29/2018 1540   HEPATIC Function Panel Recent Labs    01/27/18 1638 03/29/18 1540 04/09/18 1058  PROT 7.2 7.6 7.0   HEMOGLOBIN A1C No components found for: HGA1C,  MPG CARDIAC ENZYMES Lab Results  Component Value Date   CKTOTAL 178 (H) 03/08/2012   CKMB 1.3 03/08/2012   TROPONINI 0.04 (HH) 04/10/2018   TROPONINI <0.03  04/09/2018   TROPONINI <0.03 04/09/2018   BNP No results for input(s): PROBNP in the last 8760 hours. TSH Recent Labs    01/27/18 1638 04/09/18 1552  TSH 1.980 2.345   CHOLESTEROL No results for input(s): CHOL in the last 8760 hours.  Scheduled Meds: . atorvastatin  40 mg Oral q1800  . carvedilol  3.125 mg Oral BID WC  . cloNIDine  0.1 mg Oral QHS  . enoxaparin (LOVENOX) injection  40 mg Subcutaneous Q24H  . losartan  25 mg Oral Daily  . pantoprazole  40 mg Oral Daily  . potassium chloride  20 mEq Oral TID  . [START ON 04/14/2018] Vitamin D (Ergocalciferol)  50,000 Units Oral Q Thu   Continuous Infusions: . sodium chloride 10 mL/hr at 04/11/18 0600  . DOBUTamine 5 mcg/kg/min (04/10/18 2050)   PRN Meds:.sodium chloride, Melatonin, naphazoline-pheniramine, nitroGLYCERIN  Assessment/Plan: Acute systolic left heart failure R/O CAD Hypertension Hyperlipidemia Chronic anxiety Hypokalemia  Continue inotropic support. Potassium supplement. R + L heart catheterization. Patient understood procedure and risks and wants to proceed with cardiac intervention.   LOS: 2 days    Kamaria Lucia  MD  04/11/2018, 9:46 AM     

## 2018-04-11 NOTE — Progress Notes (Deleted)
Patient started menstrual cycle, unable to collect urine specimen. Lab notified.  Barrington Ellisonlivia Jessice Madill, RN

## 2018-04-11 NOTE — Progress Notes (Signed)
Ref: Hoy Register, MD   Subjective:  Awake. Breathing is improving. Mild tachycardia on inotropic support. Echocardiogram showed poor LV systolic and preserved RV systolic function.  Objective:  Vital Signs in the last 24 hours: Temp:  [98.4 F (36.9 C)-98.7 F (37.1 C)] 98.7 F (37.1 C) (08/05 0431) Pulse Rate:  [93-111] 111 (08/05 0431) Cardiac Rhythm: Normal sinus rhythm (08/05 0709) Resp:  [20] 20 (08/05 0431) BP: (93-124)/(63-86) 118/82 (08/05 0746) SpO2:  [90 %-100 %] 100 % (08/05 0431) Weight:  [74.1 kg (163 lb 6.4 oz)] 74.1 kg (163 lb 6.4 oz) (08/05 0431)  Physical Exam: BP Readings from Last 1 Encounters:  04/11/18 118/82     Wt Readings from Last 1 Encounters:  04/11/18 74.1 kg (163 lb 6.4 oz)    Weight change: -2.54 kg (-5 lb 9.6 oz) Body mass index is 27.19 kg/m. HEENT: [place]/AT, Eyes-Brown, PERL, EOMI, Conjunctiva-Pink, Sclera-Non-icteric Neck: No JVD, No bruit, Trachea midline. Lungs:  Clear, Bilateral. Cardiac:  Regular rhythm, normal S1 and S2, no S3. II/VI systolic murmur. Abdomen:  Soft, non-tender. BS present. Extremities:  No edema present. No cyanosis. No clubbing. CNS: AxOx3, Cranial nerves grossly intact, moves all 4 extremities.  Skin: Warm and dry.   Intake/Output from previous day: 08/04 0701 - 08/05 0700 In: 1058.4 [P.O.:840; I.V.:218.4] Out: 2300 [Urine:2300]    Lab Results: BMET    Component Value Date/Time   NA 139 04/11/2018 0501   NA 139 04/10/2018 0429   NA 140 04/09/2018 1058   NA 140 04/09/2018 1058   NA 140 03/29/2018 1540   NA 142 01/27/2018 1638   NA 141 03/31/2017 1435   K 3.0 (L) 04/11/2018 0501   K 3.5 04/10/2018 0429   K 3.5 04/09/2018 1058   K 3.5 04/09/2018 1058   CL 103 04/11/2018 0501   CL 104 04/10/2018 0429   CL 107 04/09/2018 1058   CL 107 04/09/2018 1058   CO2 27 04/11/2018 0501   CO2 19 (L) 04/10/2018 0429   CO2 21 (L) 04/09/2018 1058   CO2 21 (L) 04/09/2018 1058   GLUCOSE 126 (H) 04/11/2018 0501   GLUCOSE 127 (H) 04/10/2018 0429   GLUCOSE 142 (H) 04/09/2018 1058   GLUCOSE 139 (H) 04/09/2018 1058   BUN 10 04/11/2018 0501   BUN 10 04/10/2018 0429   BUN 7 04/09/2018 1058   BUN 8 04/09/2018 1058   BUN 13 03/29/2018 1540   BUN 13 01/27/2018 1638   BUN 10 03/31/2017 1435   CREATININE 1.23 (H) 04/11/2018 0501   CREATININE 1.16 (H) 04/10/2018 0429   CREATININE 1.15 (H) 04/09/2018 1058   CREATININE 1.17 (H) 04/09/2018 1058   CREATININE 1.18 (H) 12/05/2015 1506   CREATININE 0.94 11/21/2014 1505   CREATININE 1.00 05/03/2014 1220   CALCIUM 8.8 (L) 04/11/2018 0501   CALCIUM 8.9 04/10/2018 0429   CALCIUM 9.2 04/09/2018 1058   CALCIUM 9.2 04/09/2018 1058   GFRNONAA 51 (L) 04/11/2018 0501   GFRNONAA 55 (L) 04/10/2018 0429   GFRNONAA 56 (L) 04/09/2018 1058   GFRNONAA 55 (L) 04/09/2018 1058   GFRNONAA 75 11/21/2014 1505   GFRNONAA 69 05/03/2014 1220   GFRAA 60 (L) 04/11/2018 0501   GFRAA >60 04/10/2018 0429   GFRAA >60 04/09/2018 1058   GFRAA >60 04/09/2018 1058   GFRAA 86 11/21/2014 1505   GFRAA 80 05/03/2014 1220   CBC    Component Value Date/Time   WBC 6.5 04/10/2018 0429   RBC 5.25 (H) 04/10/2018 0429  HGB 13.9 04/10/2018 0429   HGB 14.7 03/29/2018 1540   HCT 43.8 04/10/2018 0429   HCT 45.6 03/29/2018 1540   PLT 132 (L) 04/10/2018 0429   PLT 164 03/29/2018 1540   MCV 83.4 04/10/2018 0429   MCV 81 03/29/2018 1540   MCH 26.5 04/10/2018 0429   MCHC 31.7 04/10/2018 0429   RDW 15.3 04/10/2018 0429   RDW 14.6 03/29/2018 1540   LYMPHSABS 2.1 03/29/2018 1540   MONOABS 0.3 12/05/2015 1506   EOSABS 0.6 (H) 03/29/2018 1540   BASOSABS 0.1 03/29/2018 1540   HEPATIC Function Panel Recent Labs    01/27/18 1638 03/29/18 1540 04/09/18 1058  PROT 7.2 7.6 7.0   HEMOGLOBIN A1C No components found for: HGA1C,  MPG CARDIAC ENZYMES Lab Results  Component Value Date   CKTOTAL 178 (H) 03/08/2012   CKMB 1.3 03/08/2012   TROPONINI 0.04 (HH) 04/10/2018   TROPONINI <0.03  04/09/2018   TROPONINI <0.03 04/09/2018   BNP No results for input(s): PROBNP in the last 8760 hours. TSH Recent Labs    01/27/18 1638 04/09/18 1552  TSH 1.980 2.345   CHOLESTEROL No results for input(s): CHOL in the last 8760 hours.  Scheduled Meds: . atorvastatin  40 mg Oral q1800  . carvedilol  3.125 mg Oral BID WC  . cloNIDine  0.1 mg Oral QHS  . enoxaparin (LOVENOX) injection  40 mg Subcutaneous Q24H  . losartan  25 mg Oral Daily  . pantoprazole  40 mg Oral Daily  . potassium chloride  20 mEq Oral TID  . [START ON 04/14/2018] Vitamin D (Ergocalciferol)  50,000 Units Oral Q Thu   Continuous Infusions: . sodium chloride 10 mL/hr at 04/11/18 0600  . DOBUTamine 5 mcg/kg/min (04/10/18 2050)   PRN Meds:.sodium chloride, Melatonin, naphazoline-pheniramine, nitroGLYCERIN  Assessment/Plan: Acute systolic left heart failure R/O CAD Hypertension Hyperlipidemia Chronic anxiety Hypokalemia  Continue inotropic support. Potassium supplement. R + L heart catheterization. Patient understood procedure and risks and wants to proceed with cardiac intervention.   LOS: 2 days    Orpah CobbAjay Ginny Loomer  MD  04/11/2018, 9:46 AM

## 2018-04-12 ENCOUNTER — Encounter (HOSPITAL_BASED_OUTPATIENT_CLINIC_OR_DEPARTMENT_OTHER): Payer: Self-pay

## 2018-04-12 ENCOUNTER — Encounter (HOSPITAL_COMMUNITY): Payer: Self-pay | Admitting: Cardiovascular Disease

## 2018-04-12 ENCOUNTER — Encounter: Payer: Self-pay | Admitting: *Deleted

## 2018-04-12 ENCOUNTER — Encounter (HOSPITAL_COMMUNITY)
Admission: EM | Disposition: A | Discharge status: Home / Self Care | Payer: Self-pay | Source: Home / Self Care | Attending: Cardiovascular Disease

## 2018-04-12 HISTORY — PX: RIGHT/LEFT HEART CATH AND CORONARY ANGIOGRAPHY: CATH118266

## 2018-04-12 LAB — POCT I-STAT 3, ART BLOOD GAS (G3+)
Acid-base deficit: 5 mmol/L — ABNORMAL HIGH (ref 0.0–2.0)
Bicarbonate: 18.7 mmol/L — ABNORMAL LOW (ref 20.0–28.0)
O2 Saturation: 86 %
PCO2 ART: 30.4 mmHg — AB (ref 32.0–48.0)
TCO2: 20 mmol/L — AB (ref 22–32)
pH, Arterial: 7.397 (ref 7.350–7.450)
pO2, Arterial: 50 mmHg — ABNORMAL LOW (ref 83.0–108.0)

## 2018-04-12 LAB — POCT I-STAT 3, VENOUS BLOOD GAS (G3P V)
ACID-BASE DEFICIT: 3 mmol/L — AB (ref 0.0–2.0)
BICARBONATE: 21.3 mmol/L (ref 20.0–28.0)
O2 SAT: 46 %
PCO2 VEN: 36 mmHg — AB (ref 44.0–60.0)
PO2 VEN: 25 mmHg — AB (ref 32.0–45.0)
TCO2: 22 mmol/L (ref 22–32)
pH, Ven: 7.381 (ref 7.250–7.430)

## 2018-04-12 LAB — BASIC METABOLIC PANEL
Anion gap: 9 (ref 5–15)
BUN: 10 mg/dL (ref 6–20)
CHLORIDE: 106 mmol/L (ref 98–111)
CO2: 24 mmol/L (ref 22–32)
CREATININE: 1.05 mg/dL — AB (ref 0.44–1.00)
Calcium: 9 mg/dL (ref 8.9–10.3)
Glucose, Bld: 131 mg/dL — ABNORMAL HIGH (ref 70–99)
Potassium: 3.7 mmol/L (ref 3.5–5.1)
SODIUM: 139 mmol/L (ref 135–145)

## 2018-04-12 LAB — BRAIN NATRIURETIC PEPTIDE: B NATRIURETIC PEPTIDE 5: 241.5 pg/mL — AB (ref 0.0–100.0)

## 2018-04-12 SURGERY — RIGHT/LEFT HEART CATH AND CORONARY ANGIOGRAPHY
Anesthesia: LOCAL

## 2018-04-12 MED ORDER — FENTANYL CITRATE (PF) 100 MCG/2ML IJ SOLN
INTRAMUSCULAR | Status: DC | PRN
  Administered 2018-04-12: 25 ug via INTRAVENOUS

## 2018-04-12 MED ORDER — HEPARIN (PORCINE) IN NACL 1000-0.9 UT/500ML-% IV SOLN
INTRAVENOUS | Status: DC | PRN
  Administered 2018-04-12 (×2): 500 mL

## 2018-04-12 MED ORDER — SODIUM CHLORIDE 0.9% FLUSH
3.0000 mL | Freq: Two times a day (BID) | INTRAVENOUS | Status: DC
  Administered 2018-04-12: 3 mL via INTRAVENOUS

## 2018-04-12 MED ORDER — HEPARIN (PORCINE) IN NACL 1000-0.9 UT/500ML-% IV SOLN
INTRAVENOUS | Status: AC
  Filled 2018-04-12: qty 1000

## 2018-04-12 MED ORDER — MIDAZOLAM HCL 2 MG/2ML IJ SOLN
INTRAMUSCULAR | Status: DC | PRN
  Administered 2018-04-12: 1 mg via INTRAVENOUS

## 2018-04-12 MED ORDER — LIDOCAINE HCL (PF) 1 % IJ SOLN
INTRAMUSCULAR | Status: DC | PRN
  Administered 2018-04-12: 10 mL

## 2018-04-12 MED ORDER — ACETAMINOPHEN 325 MG PO TABS: 650.0000 mg | ORAL_TABLET | ORAL | Status: DC | PRN

## 2018-04-12 MED ORDER — IOPAMIDOL (ISOVUE-370) INJECTION 76%
INTRAVENOUS | Status: DC | PRN
  Administered 2018-04-12: 35 mL via INTRA_ARTERIAL

## 2018-04-12 MED ORDER — FENTANYL CITRATE (PF) 100 MCG/2ML IJ SOLN
INTRAMUSCULAR | Status: AC
  Filled 2018-04-12: qty 2

## 2018-04-12 MED ORDER — METOPROLOL TARTRATE 5 MG/5ML IV SOLN
INTRAVENOUS | Status: AC
  Filled 2018-04-12: qty 5

## 2018-04-12 MED ORDER — ONDANSETRON HCL 4 MG/2ML IJ SOLN: 4.0000 mg | Freq: Four times a day (QID) | INTRAMUSCULAR | Status: DC | PRN

## 2018-04-12 MED ORDER — LIDOCAINE HCL (PF) 1 % IJ SOLN
INTRAMUSCULAR | Status: AC
  Filled 2018-04-12: qty 30

## 2018-04-12 MED ORDER — SODIUM CHLORIDE 0.9% FLUSH: 3.0000 mL | INTRAVENOUS | Status: DC | PRN

## 2018-04-12 MED ORDER — METOPROLOL TARTRATE 5 MG/5ML IV SOLN
INTRAVENOUS | Status: DC | PRN
  Administered 2018-04-12 (×2): 2.5 mg via INTRAVENOUS

## 2018-04-12 MED ORDER — SODIUM CHLORIDE 0.9 % IV SOLN
250.0000 mL | INTRAVENOUS | Status: DC | PRN
  Administered 2018-04-12: 250 mL via INTRAVENOUS

## 2018-04-12 MED ORDER — MIDAZOLAM HCL 2 MG/2ML IJ SOLN
INTRAMUSCULAR | Status: AC
  Filled 2018-04-12: qty 2

## 2018-04-12 MED ORDER — IOPAMIDOL (ISOVUE-370) INJECTION 76%
INTRAVENOUS | Status: AC
  Filled 2018-04-12: qty 100

## 2018-04-12 SURGICAL SUPPLY — 10 items
CATH 5FR JL3.5 JR4 ANG PIG MP (CATHETERS) ×2 IMPLANT
CATH SWAN GANZ 7F STRAIGHT (CATHETERS) ×2 IMPLANT
KIT HEART LEFT (KITS) ×2 IMPLANT
NEEDLE SMART 18GX3.5CM LONG (NEEDLE) ×2 IMPLANT
PACK CARDIAC CATHETERIZATION (CUSTOM PROCEDURE TRAY) ×2 IMPLANT
SHEATH PINNACLE 5F 10CM (SHEATH) ×2 IMPLANT
SHEATH PINNACLE 7F 10CM (SHEATH) ×2 IMPLANT
SYR MEDRAD MARK V 150ML (SYRINGE) ×2 IMPLANT
TRANSDUCER W/STOPCOCK (MISCELLANEOUS) ×2 IMPLANT
WIRE EMERALD 3MM-J .035X150CM (WIRE) ×2 IMPLANT

## 2018-04-12 NOTE — Interval H&P Note (Signed)
History and Physical Interval Note:  04/12/2018 8:36 AM  Lorin GlassJerry L Greenwood  has presented today for surgery, with the diagnosis of cp  The various methods of treatment have been discussed with the patient and family. After consideration of risks, benefits and other options for treatment, the patient has consented to  Procedure(s): RIGHT/LEFT HEART CATH AND CORONARY ANGIOGRAPHY (N/A) as a surgical intervention .  The patient's history has been reviewed, patient examined, no change in status, stable for surgery.  I have reviewed the patient's chart and labs.  Questions were answered to the patient's satisfaction.     Ricki RodriguezAjay S Butch Otterson

## 2018-04-13 LAB — CBC
HCT: 39.1 % (ref 36.0–46.0)
Hemoglobin: 12.2 g/dL (ref 12.0–15.0)
MCH: 26.6 pg (ref 26.0–34.0)
MCHC: 31.2 g/dL (ref 30.0–36.0)
MCV: 85.4 fL (ref 78.0–100.0)
PLATELETS: 150 10*3/uL (ref 150–400)
RBC: 4.58 MIL/uL (ref 3.87–5.11)
RDW: 15.4 % (ref 11.5–15.5)
WBC: 6.9 10*3/uL (ref 4.0–10.5)

## 2018-04-13 LAB — BASIC METABOLIC PANEL
Anion gap: 9 (ref 5–15)
BUN: 9 mg/dL (ref 6–20)
CALCIUM: 8.8 mg/dL — AB (ref 8.9–10.3)
CO2: 23 mmol/L (ref 22–32)
CREATININE: 1.02 mg/dL — AB (ref 0.44–1.00)
Chloride: 105 mmol/L (ref 98–111)
Glucose, Bld: 123 mg/dL — ABNORMAL HIGH (ref 70–99)
Potassium: 4.5 mmol/L (ref 3.5–5.1)
SODIUM: 137 mmol/L (ref 135–145)

## 2018-04-13 MED ORDER — SACUBITRIL-VALSARTAN 24-26 MG PO TABS
1.0000 | ORAL_TABLET | Freq: Two times a day (BID) | ORAL | Status: DC
  Administered 2018-04-13 – 2018-04-14 (×3): 1 via ORAL
  Filled 2018-04-13 (×3): qty 1

## 2018-04-13 MED ORDER — MAGNESIUM OXIDE 400 (241.3 MG) MG PO TABS
200.0000 mg | ORAL_TABLET | Freq: Every day | ORAL | Status: DC
  Administered 2018-04-14: 200 mg via ORAL
  Filled 2018-04-13: qty 1

## 2018-04-13 MED ORDER — FUROSEMIDE 20 MG PO TABS
20.0000 mg | ORAL_TABLET | Freq: Every day | ORAL | Status: DC
  Administered 2018-04-13 – 2018-04-14 (×2): 20 mg via ORAL
  Filled 2018-04-13 (×2): qty 1

## 2018-04-13 MED ORDER — POTASSIUM CHLORIDE CRYS ER 10 MEQ PO TBCR
10.0000 meq | EXTENDED_RELEASE_TABLET | Freq: Every day | ORAL | Status: DC
  Administered 2018-04-14: 10 meq via ORAL
  Filled 2018-04-13: qty 1

## 2018-04-13 NOTE — Progress Notes (Signed)
VS change after morning medication; will continue to monitor the patient closely.  Sheppard Evensina Anuja Manka RN   04/13/18 1101  Vitals  BP (!) 88/68  MAP (mmHg) 76  Pulse Rate (!) 112  ECG Heart Rate (!) 114  Oxygen Therapy  SpO2 100 %

## 2018-04-13 NOTE — Progress Notes (Signed)
Repeat VS    04/13/18 1149  Vitals  BP 92/70  MAP (mmHg) 78  Pulse Rate (!) 103  ECG Heart Rate (!) 104  Oxygen Therapy  SpO2 95 %

## 2018-04-13 NOTE — Care Management Important Message (Signed)
Important Message  Patient Details  Name: Sabrina GlassJerry L Rarick MRN: [id number] Date of Birth: 02-06-1971   Medicare Important Message Given:  Yes    Elliot CousinShavis, [place] Kenley Ellen, RN 04/13/2018, 6:12 PM

## 2018-04-13 NOTE — Care Management Note (Signed)
Case Management Note  Patient Details  Name: Lorin GlassJerry L Sotelo MRN: [id number] Date of Birth: October 09, 1970  Subjective/Objective:   CHF                 Action/Plan: NCM spoke to pt and states she [facility] but she was approved for her disability. She is paying for medications out of pocket until her Medicaid is approved. Provided pt with [facility]restro 30 day free trial card and [facility]. She will have to attach info about her income with form. Explained she will need to take with her to next appt with income info. Office will fax and application. Will review meds at dc to see if MATCH needed.   Expected Discharge Date:                  Expected Discharge Plan:  Home/Self Care  In-House Referral:  NA  Discharge planning Services  CM Consult, Medication Assistance  Post Acute Care Choice:  NA Choice offered to:  NA  DME Arranged:  N/A DME Agency:  NA  HH Arranged:  NA HH Agency:  NA  Status of Service:  Completed, signed off  If discussed at Long Length of Stay Meetings, dates discussed:    Additional Comments:  Elliot CousinShavis, Kentravious Lipford Ellen, RN 04/13/2018, 5:05 PM

## 2018-04-14 LAB — HEMOGLOBIN A1C
Hgb A1c MFr Bld: 6.3 % — ABNORMAL HIGH (ref 4.8–5.6)
MEAN PLASMA GLUCOSE: 134.11 mg/dL

## 2018-04-14 MED ORDER — MAGNESIUM OXIDE 400 (241.3 MG) MG PO TABS: 400.0000 mg | ORAL_TABLET | ORAL | 3 refills | Status: DC

## 2018-04-14 MED ORDER — CARVEDILOL 6.25 MG PO TABS: 6.2500 mg | ORAL_TABLET | Freq: Two times a day (BID) | ORAL | 3 refills | Status: DC

## 2018-04-14 MED ORDER — POTASSIUM CHLORIDE CRYS ER 10 MEQ PO TBCR: 10.0000 meq | EXTENDED_RELEASE_TABLET | Freq: Every day | ORAL | 3 refills | Status: DC

## 2018-04-14 MED ORDER — ATORVASTATIN CALCIUM 40 MG PO TABS: 40.0000 mg | ORAL_TABLET | Freq: Every day | ORAL | 3 refills | Status: DC

## 2018-04-14 MED ORDER — FUROSEMIDE 20 MG PO TABS: 20.0000 mg | ORAL_TABLET | Freq: Every day | ORAL | 3 refills | Status: DC

## 2018-04-14 MED ORDER — MEGESTROL ACETATE 40 MG PO TABS: 40.0000 mg | ORAL_TABLET | Freq: Every day | ORAL | Status: DC

## 2018-04-14 MED ORDER — SACUBITRIL-VALSARTAN 24-26 MG PO TABS: 1.0000 | ORAL_TABLET | Freq: Two times a day (BID) | ORAL | 3 refills | Status: DC

## 2018-04-14 MED FILL — FUROSEMIDE 20 MG TABLET: 20 | 30 days supply | Qty: 30 | Fill #0

## 2018-04-14 MED FILL — ENTRESTO 24 MG-26 MG TABLET: 24-26 | 30 days supply | Qty: 60 | Fill #0

## 2018-04-14 MED FILL — MAGNESIUM OXIDE 400 MG TAB: 400 | 35 days supply | Qty: 15 | Fill #0

## 2018-04-14 MED FILL — ATORVASTATIN CALCIUM 40 MG: 40 | 30 days supply | Qty: 30 | Fill #0

## 2018-04-14 MED FILL — CARVEDILOL 6.25 MG TABLET: 6.25 | 30 days supply | Qty: 60 | Fill #0

## 2018-04-14 MED FILL — POTASSIUM CL 10 MEQ TAB SA: 10 | 30 days supply | Qty: 30 | Fill #0

## 2018-04-14 NOTE — Care Management Note (Signed)
Case Management Note  Patient Details  Name: Sabrina Ortiz MRN: [id number] Date of Birth: 10-17-1970  Subjective/Objective:   CHF                 Action/Plan: NCM spoke to pt and states [facility] but she was approved for her disability. She is paying for medications out of pocket until her Medicaid is approved. Provided pt with Entrestro 30 day free trial card and [facility]. She will have to attach info about her income with form. Explained she will need to take with her to next appt with income info. Office will fax and application. Will review meds at dc to see if MATCH needed.   Expected Discharge Date:  04/14/18               Expected Discharge Plan:  Home/Self Care  In-House Referral:  NA  Discharge planning Services  CM Consult, Medication Assistance  Post Acute Care Choice:  NA Choice offered to:  NA  DME Arranged:  N/A DME Agency:  NA  HH Arranged:  NA HH Agency:  NA  Status of Service:  Completed, signed off  If discussed at Long Length of Stay Meetings, dates discussed:    Additional Comments:  04/24/18 J. Brandt Chaney, RN, BSN [facility], as she has Medicare.  Reviewed medications with pt, and [facility], with exception of [facility]  [facility], and pt will pick up there and use 30 day free trial card for payment.  She has Rx assistance form to follow up on; she plans to bring income documents and form to next cardiology appt.  [facility]; pt plans to make follow up appt ASAP.  She plans to call on Monday AM when schedule opens for appt.    Quintella BatonJulie W. Mollie Rossano, RN, BSN  Trauma/Neuro ICU Case Manager [phone]

## 2018-04-14 NOTE — Discharge Summary (Signed)
Physician Discharge Summary  Patient ID: Sabrina Ortiz MRN: [id number] DOB/AGE: 1971/03/20 47 y.o.  Admit date: 04/09/2018 Discharge date: 04/14/2018  Admission Diagnoses: Acute pulmonary edema Generalized anxiety disorder Essential hypertension Vitamin D deficiency Prolonged QTc  Discharge Diagnoses:  Principle problem: Acute systolic left heart failure Active Problems:   Essential hypertension   Vitamin D deficiency   Acute pulmonary edema (HCC)   Hyperlipidemia   Hypokalemia-resolved   Mild mitral valve regurgitation   Moderate pulmonary systolic hypertension   Hyperglycemia as Prediabetic   Generalized anxiety disorder    Discharged Condition: fair  Hospital Course: 47 year old female was admitted with acute pulmonary edema, tachycardia and shortness of breath.  She had echocardiogram on 04/10/18 showing dilated LV with severe systolic dysfunction with EF 20-25 %. She had mild MR and TR with moderately dilated LA and RA. She had improved diuresis with IV inotropic agent dobutamine.  She had right and left heart catheterization on 04/12/18 showing normal coronaries and moderate pulmonary hypertension.  She had addition diuresis with improved blood pressure control. She tolerated Entresto, Carvedilol and low dose furosemide. She was able to walk and breath better. She was discharged home in stable condition with follow up by me in 1 week and by primary care in 1 month.  She has narrow complex QRS on her EKG. If her LV function remains poor she may qualify for ICD placement and advanced heart failure team management.  Consults: cardiology  Significant Diagnostic Studies: labs: Near normal CBC with mildly low platelets count of 143K. Near normal BMET except mild hyperglycemia and borderline high creatinine of 1.15. Troponin I was minimally elevated at 0.04 ng. TSH was normal. BNP improved from 1.027 to 241.5 in 72 hours. D-dime was 0.60 mcg/ml  EKG-Sinus tachycardia with  non-specific T wave abnormality.  Echocardiogram : Poor LV systolic function, mild TR and TR and moderately dilated LA and RA.  Right heart cath showed moderate pulmonary hypertension, Left heart cath showed normal coronaries.  Treatments: cardiac meds: atorvastatin, carvedilol, furosemide, potassium and Entresto.  Discharge Exam: Blood pressure 112/84, pulse (!) 102, temperature 98.5 F (36.9 C), temperature source Oral, resp. rate 20, height 5\' 5"  (1.651 m), weight 75.5 kg, SpO2 98 %. General appearance: alert, cooperative and appears stated age. Head: Normocephalic, atraumatic. Eyes: Brown eyes, pink conjunctiva, corneas clear. PERRL, EOM's intact.  Neck: No adenopathy, no carotid bruit, no JVD, supple, symmetrical, trachea midline and thyroid not enlarged. Resp: Clear to auscultation bilaterally. Cardio: Regular rate and rhythm, S1, S2 normal, II/VI systolic murmur, no click, rub or gallop. GI: Soft, non-tender; bowel sounds normal; no organomegaly. Extremities: No edema, cyanosis or clubbing. Skin: Warm and dry.  Neurologic: Alert and oriented X 3, normal strength and tone. Normal coordination and gait.  Disposition: Discharge disposition: 01-Home or Self Care        Allergies as of 04/14/2018      Reactions   Shellfish Allergy    Swelling up throat. Eat seafood, her throat may swell up when eat 2-3x a month.       Medication List    STOP taking these medications   cloNIDine 0.1 MG tablet Commonly known as:  CATAPRES   fluticasone 50 MCG/ACT nasal spray Commonly known as:  FLONASE   hydrOXYzine 25 MG tablet Commonly known as:  ATARAX/VISTARIL   levocetirizine 5 MG tablet Commonly known as:  XYZAL   lisinopril-hydrochlorothiazide 20-12.5 MG tablet Commonly known as:  PRINZIDE,ZESTORETIC   pravastatin 40 MG tablet Commonly  known as:  PRAVACHOL   traZODone 50 MG tablet Commonly known as:  DESYREL     TAKE these medications   atorvastatin 40 MG  tablet Commonly known as:  LIPITOR Take 1 tablet (40 mg total) by mouth daily at 6 PM.   carvedilol 6.25 MG tablet Commonly known as:  COREG Take 1 tablet (6.25 mg total) by mouth 2 (two) times daily with a meal.   EYE ALLERGY RELIEF 0.025-0.3 % ophthalmic solution Generic drug:  naphazoline-pheniramine Place 1 drop into both eyes 4 (four) times daily as needed for eye irritation or allergies.   furosemide 20 MG tablet Commonly known as:  LASIX Take 1 tablet (20 mg total) by mouth daily. Start taking on:  04/15/2018   magnesium oxide 400 (241.3 Mg) MG tablet Commonly known as:  MAG-OX Take 1 tablet (400 mg total) by mouth every Monday, Wednesday, and Friday. Start taking on:  04/15/2018   megestrol 40 MG tablet Commonly known as:  MEGACE Take 1 tablet (40 mg total) by mouth daily. What changed:  when to take this   Melatonin 3 MG Caps Take by mouth.   omeprazole 20 MG capsule Commonly known as:  PRILOSEC Take 1 capsule (20 mg total) by mouth daily.   potassium chloride 10 MEQ tablet Commonly known as:  K-DUR,KLOR-CON Take 1 tablet (10 mEq total) by mouth daily. Start taking on:  04/15/2018   sacubitril-valsartan 24-26 MG Commonly known as:  ENTRESTO Take 1 tablet by mouth 2 (two) times daily.   Vitamin D (Ergocalciferol) 50000 units Caps capsule Commonly known as:  DRISDOL Take 1 capsule (50,000 Units total) by mouth every 7 (seven) days. What changed:  when to take this      Follow-up Information    Hoy RegisterNewlin, Enobong, MD. Schedule an appointment as soon as possible for a visit in 1 month(s).   Specialty:  Family Medicine Contact information: [address] [place] [address] [phone]        Orpah CobbKadakia, Shawnie Nicole, MD. Schedule an appointment as soon as possible for a visit in 1 week(s).   Specialty:  Cardiology Contact information: [address] [place] [address] [phone]           Signed: Ricki Rodriguezjay S Shade Rivenbark 04/14/2018, 9:33 AM

## 2018-04-16 NOTE — Progress Notes (Signed)
Ref: Hoy RegisterNewlin, Enobong, MD   Subjective:  Feeling better. Cardiac cath showed non-ischemic cardiomyopathy with Moderate pulmonary hypertension.  Objective:  Vital Signs in the last 24 hours:    Physical Exam: BP Readings from Last 1 Encounters:  04/14/18 112/84     Wt Readings from Last 1 Encounters:  04/14/18 75.5 kg    Weight change:  Body mass index is 27.69 kg/m. HEENT: [place]/AT, Eyes-Brown, PERL, EOMI, Conjunctiva-Pink, Sclera-Non-icteric Neck: No JVD, No bruit, Trachea midline. Lungs:  Clearing, Bilateral. Cardiac:  Regular rhythm, normal S1 and S2, no S3. II/VI systolic murmur. Abdomen:  Soft, non-tender. BS present. Extremities:  No edema present. No cyanosis. No clubbing. No right groin hematoma. CNS: AxOx3, Cranial nerves grossly intact, moves all 4 extremities.  Skin: Warm and dry.   Intake/Output from previous day: No intake/output data recorded.    Lab Results: BMET    Component Value Date/Time   NA 137 04/13/2018 0358   NA 139 04/12/2018 0540   NA 139 04/11/2018 0501   NA 140 03/29/2018 1540   NA 142 01/27/2018 1638   NA 141 03/31/2017 1435   K 4.5 04/13/2018 0358   K 3.7 04/12/2018 0540   K 3.0 (L) 04/11/2018 0501   CL 105 04/13/2018 0358   CL 106 04/12/2018 0540   CL 103 04/11/2018 0501   CO2 23 04/13/2018 0358   CO2 24 04/12/2018 0540   CO2 27 04/11/2018 0501   GLUCOSE 123 (H) 04/13/2018 0358   GLUCOSE 131 (H) 04/12/2018 0540   GLUCOSE 126 (H) 04/11/2018 0501   BUN 9 04/13/2018 0358   BUN 10 04/12/2018 0540   BUN 10 04/11/2018 0501   BUN 13 03/29/2018 1540   BUN 13 01/27/2018 1638   BUN 10 03/31/2017 1435   CREATININE 1.02 (H) 04/13/2018 0358   CREATININE 1.05 (H) 04/12/2018 0540   CREATININE 1.23 (H) 04/11/2018 0501   CREATININE 1.18 (H) 12/05/2015 1506   CREATININE 0.94 11/21/2014 1505   CREATININE 1.00 05/03/2014 1220   CALCIUM 8.8 (L) 04/13/2018 0358   CALCIUM 9.0 04/12/2018 0540   CALCIUM 8.8 (L) 04/11/2018 0501   GFRNONAA >60  04/13/2018 0358   GFRNONAA >60 04/12/2018 0540   GFRNONAA 51 (L) 04/11/2018 0501   GFRNONAA 75 11/21/2014 1505   GFRNONAA 69 05/03/2014 1220   GFRAA >60 04/13/2018 0358   GFRAA >60 04/12/2018 0540   GFRAA 60 (L) 04/11/2018 0501   GFRAA 86 11/21/2014 1505   GFRAA 80 05/03/2014 1220   CBC    Component Value Date/Time   WBC 6.9 04/13/2018 0358   RBC 4.58 04/13/2018 0358   HGB 12.2 04/13/2018 0358   HGB 14.7 03/29/2018 1540   HCT 39.1 04/13/2018 0358   HCT 45.6 03/29/2018 1540   PLT 150 04/13/2018 0358   PLT 164 03/29/2018 1540   MCV 85.4 04/13/2018 0358   MCV 81 03/29/2018 1540   MCH 26.6 04/13/2018 0358   MCHC 31.2 04/13/2018 0358   RDW 15.4 04/13/2018 0358   RDW 14.6 03/29/2018 1540   LYMPHSABS 2.1 03/29/2018 1540   MONOABS 0.3 12/05/2015 1506   EOSABS 0.6 (H) 03/29/2018 1540   BASOSABS 0.1 03/29/2018 1540   HEPATIC Function Panel Recent Labs    01/27/18 1638 03/29/18 1540 04/09/18 1058  PROT 7.2 7.6 7.0   HEMOGLOBIN A1C No components found for: HGA1C,  MPG CARDIAC ENZYMES Lab Results  Component Value Date   CKTOTAL 178 (H) 03/08/2012   CKMB 1.3 03/08/2012   TROPONINI 0.04 (  HH) 04/10/2018   TROPONINI <0.03 04/09/2018   TROPONINI <0.03 04/09/2018   BNP No results for input(s): PROBNP in the last 8760 hours. TSH Recent Labs    01/27/18 1638 04/09/18 1552  TSH 1.980 2.345   CHOLESTEROL No results for input(s): CHOL in the last 8760 hours.  Scheduled Meds: Continuous Infusions: PRN Meds:.  Assessment/Plan: Acute systolic left heart failure Non-ischemic cardiomyopathy Hypertension Hyperlipidemia Hypokalemia-resolved Chronic anxiety  Continue diuresis. Wean off oxygen Increase activity.   LOS: 4 days   Orpah Cobb  MD  04/16/2018, 3:09 PM

## 2018-04-20 ENCOUNTER — Telehealth: Payer: Self-pay | Admitting: Family Medicine

## 2018-04-20 NOTE — Telephone Encounter (Signed)
Patient was called and informed to contact GYN for medication.

## 2018-04-20 NOTE — Telephone Encounter (Signed)
Will route to PCP for review. 

## 2018-04-20 NOTE — Telephone Encounter (Signed)
Megace was prescribed by her GYN, Dr. Gigi GinPeggy and never by me; she might want to get in touch with GYN.

## 2018-04-20 NOTE — Telephone Encounter (Signed)
Patient called stating that her cardiologist wanted her pcp to prescribe her an alternative medication for megestrol (MEGACE) 40 MG tablet [[id number]][[id number]]

## 2018-04-21 ENCOUNTER — Telehealth: Payer: Self-pay | Admitting: General Practice

## 2018-04-21 ENCOUNTER — Telehealth: Payer: Self-pay | Admitting: Allergy and Immunology

## 2018-04-21 NOTE — Telephone Encounter (Addendum)
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 However, given her history shrimp and other shellfish should be carefully avoided and she should have access to epinephrine autoinjector 2 pack. Citrus panel and fish panel were both negative. It should be noted that she has some abnormalities on liver function test and this should be followed by her primary care physician. Please send a copy of these labs to her primary care physician/referring physician. please provide her with a food allergy action plan for shellfish and make sure that she has a prescription for epinephrine autoinjector 2 pack. Emergency action plan placed on Dr Maren ReamerBobbitts desk to be signed. Patient states she does not have insurance for medications and cannot afford epipen wants to know if there is a discount for them spoke to [place] about this states she needs to go to [employer] web site and apply for assistance. Writer did call patient back to advise of this she verbalized understanding

## 2018-04-21 NOTE — Telephone Encounter (Signed)
Patient called & left message on nurse voicemail line stating she was just released from the hospital and was advised to find an alternative medication to Megace to take. Patient states she has been taking megace for 2 years. Patient requests call back.

## 2018-04-21 NOTE — Telephone Encounter (Signed)
Patient called and said she has results of tests but she can't read them and wants to know what she is allergic to.   So she would like someone to give her a call.  Thank you, Duwayne Heckanielle

## 2018-04-25 ENCOUNTER — Telehealth: Payer: Self-pay | Admitting: General Practice

## 2018-04-25 DIAGNOSIS — I5022 Chronic systolic (congestive) heart failure: Secondary | ICD-10-CM | POA: Diagnosis not present

## 2018-04-25 DIAGNOSIS — I2510 Atherosclerotic heart disease of native coronary artery without angina pectoris: Secondary | ICD-10-CM | POA: Diagnosis not present

## 2018-04-25 DIAGNOSIS — I10 Essential (primary) hypertension: Secondary | ICD-10-CM | POA: Diagnosis not present

## 2018-04-25 MED FILL — OMEPRAZOLE 20 MG CAP: 20 | 30 days supply | Qty: 30 | Fill #1

## 2018-04-25 NOTE — Telephone Encounter (Signed)
Called and notified patient of appt with Dr. Jolayne Pantheronstant on 05/17/18 at 10:35am.  Patient verbalized understanding.

## 2018-04-25 NOTE — Telephone Encounter (Signed)
Please call patient to schedule an appointment. [facility] Clovis PuMartin, Ardis Lawley L, RN

## 2018-04-27 ENCOUNTER — Ambulatory Visit: Payer: Medicare Other | Attending: Family Medicine | Admitting: Family Medicine

## 2018-04-27 ENCOUNTER — Encounter: Payer: Self-pay | Admitting: Family Medicine

## 2018-04-27 ENCOUNTER — Other Ambulatory Visit: Payer: Self-pay

## 2018-04-27 VITALS — BP 91/71 | HR 94 | Temp 98.7°F | Resp 18 | Ht 66.0 in | Wt 165.6 lb

## 2018-04-27 DIAGNOSIS — I10 Essential (primary) hypertension: Secondary | ICD-10-CM

## 2018-04-27 DIAGNOSIS — I110 Hypertensive heart disease with heart failure: Secondary | ICD-10-CM | POA: Insufficient documentation

## 2018-04-27 DIAGNOSIS — I5020 Unspecified systolic (congestive) heart failure: Secondary | ICD-10-CM

## 2018-04-27 DIAGNOSIS — I5021 Acute systolic (congestive) heart failure: Secondary | ICD-10-CM | POA: Diagnosis not present

## 2018-04-27 DIAGNOSIS — Z1239 Encounter for other screening for malignant neoplasm of breast: Secondary | ICD-10-CM

## 2018-04-27 DIAGNOSIS — R7303 Prediabetes: Secondary | ICD-10-CM | POA: Diagnosis not present

## 2018-04-27 DIAGNOSIS — Z1231 Encounter for screening mammogram for malignant neoplasm of breast: Secondary | ICD-10-CM

## 2018-04-27 DIAGNOSIS — Z79899 Other long term (current) drug therapy: Secondary | ICD-10-CM | POA: Diagnosis not present

## 2018-04-27 DIAGNOSIS — Z9889 Other specified postprocedural states: Secondary | ICD-10-CM | POA: Diagnosis not present

## 2018-04-27 DIAGNOSIS — K219 Gastro-esophageal reflux disease without esophagitis: Secondary | ICD-10-CM | POA: Diagnosis not present

## 2018-04-27 DIAGNOSIS — F4310 Post-traumatic stress disorder, unspecified: Secondary | ICD-10-CM | POA: Diagnosis not present

## 2018-04-27 MED ORDER — OMEPRAZOLE 20 MG PO CPDR: 20.0000 mg | DELAYED_RELEASE_CAPSULE | Freq: Every day | ORAL | 3 refills | Status: DC

## 2018-04-27 MED ORDER — MELATONIN 3 MG PO CAPS: 3.0000 mg | ORAL_CAPSULE | Freq: Every evening | ORAL | 11 refills | Status: DC | PRN

## 2018-04-27 NOTE — Patient Instructions (Signed)
Prediabetes Eating Plan Prediabetes-also called impaired glucose tolerance or impaired fasting glucose-is a condition that causes blood sugar (blood glucose) levels to be higher than normal. Following a healthy diet can help to keep prediabetes under control. It can also help to lower the risk of type 2 diabetes and heart disease, which are increased in people who have prediabetes. Along with regular exercise, a healthy diet:  Promotes weight loss.  Helps to control blood sugar levels.  Helps to improve the way that the body uses insulin.  What do I need to know about this eating plan?  Use the glycemic index (GI) to plan your meals. The index tells you how quickly a food will raise your blood sugar. Choose low-GI foods. These foods take a longer time to raise blood sugar.  Pay close attention to the amount of carbohydrates in the food that you eat. Carbohydrates increase blood sugar levels.  Keep track of how many calories you take in. Eating the right amount of calories will help you to achieve a healthy weight. Losing about 7 percent of your starting weight can help to prevent type 2 diabetes.  You may want to follow a Mediterranean diet. This diet includes a lot of vegetables, lean meats or fish, whole grains, fruits, and healthy oils and fats. What foods can I eat? Grains Whole grains, such as whole-wheat or whole-grain breads, crackers, cereals, and pasta. Unsweetened oatmeal. Bulgur. Barley. Quinoa. Brown rice. Corn or whole-wheat flour tortillas or taco shells. Vegetables Lettuce. Spinach. Peas. Beets. Cauliflower. Cabbage. Broccoli. Carrots. Tomatoes. Squash. Eggplant. Herbs. Peppers. Onions. Cucumbers. Brussels sprouts. Fruits Berries. Bananas. Apples. Oranges. Grapes. Papaya. Mango. Pomegranate. Kiwi. Grapefruit. Cherries. Meats and Other Protein Sources Seafood. Lean meats, such as chicken and turkey or lean cuts of pork and beef. Tofu. Eggs. Nuts. Beans. Dairy Low-fat or  fat-free dairy products, such as yogurt, cottage cheese, and cheese. Beverages Water. Tea. Coffee. Sugar-free or diet soda. Seltzer water. Milk. Milk alternatives, such as soy or almond milk. Condiments Mustard. Relish. Low-fat, low-sugar ketchup. Low-fat, low-sugar barbecue sauce. Low-fat or fat-free mayonnaise. Sweets and Desserts Sugar-free or low-fat pudding. Sugar-free or low-fat ice cream and other frozen treats. Fats and Oils Avocado. Walnuts. Olive oil. The items listed above may not be a complete list of recommended foods or beverages. Contact your dietitian for more options. What foods are not recommended? Grains Refined white flour and flour products, such as bread, pasta, snack foods, and cereals. Beverages Sweetened drinks, such as sweet iced tea and soda. Sweets and Desserts Baked goods, such as cake, cupcakes, pastries, cookies, and cheesecake. The items listed above may not be a complete list of foods and beverages to avoid. Contact your dietitian for more information. This information is not intended to replace advice given to you by your health care provider. Make sure you discuss any questions you have with your health care provider. Document Released: 01/08/2015 Document Revised: 01/30/2016 Document Reviewed: 09/19/2014 Elsevier Interactive Patient Education  2017 Elsevier Inc.  

## 2018-04-27 NOTE — Progress Notes (Signed)
Subjective:    Patient ID: Sabrina Ortiz, female    DOB: April 12, 1971, 47 y.o.   MRN: [id number]  HPI  47 yo female seen in follow-up of chronic medical issues and has had recent hospitalization on 04/09/18-04/14/18 with a new diagnosis of acute systolic heart failure with EF of 20-25%,  acute pulmonary edema, moderate pulmonary hypertension and hyperglycemia in pre-diabetes. Patient reports that she feels good at today's visit. No shortness of breath. No swelling in her legs. No muscle cramping.  Patient states that she saw her cardiologist on Monday and was told that if she was still doing as well as she currently is doing in some of her medications may be discontinued.  Patient states that prior to her hospitalization, 1 of her medications, lisinopril had been discontinued and she was not aware that a different medication, carvedilol was prescribed.  Patient thought that she was supposed to be taking a medication called clonidine instead.  Patient never picked up the carvedilol as she did not know it had been prescribed.  Patient thinks that she is currently taking carvedilol and she feels that it makes her feel funny.  Patient has had some mild dizziness/lightheadedness after taking her medications.  Patient states that her blood pressure has been controlled.  Patient does need refill of omeprazole to help with acid reflux as this medication has decreased reflux symptoms.  Patient states that she was not really aware of the diagnosis of prediabetes.  Patient denies any increased thirst or increased hunger.  Patient does have some urinary frequency but she associates this with her use of a fluid pill prescribed at hospital discharge.  Patient denies dysuria.       Patient reports a history of anxiety and depression.  Patient states that she was attending [facility] for behavioral health services however it seemed like her doctor would change each time she went for visit and patient states that it would cause her  increased anxiety to repeat her issues each time she saw a new physician and therefore she is not currently receiving mental health services but she intends to call to reestablish.  Patient states that she is currently on disability secondary to issues with anxiety as well as a past work-related back injury.  Past Medical History:  Diagnosis Date  . Angio-edema   . Anxiety   . Chronic back pain   . Depression   . Hypertension   . Mental disorder   . PTSD (post-traumatic stress disorder)   . Urticaria    Family History  Problem Relation Age of Onset  . Breast cancer Mother    Past Surgical History:  Procedure Laterality Date  . CYST REMOVAL HAND    . RIGHT/LEFT HEART CATH AND CORONARY ANGIOGRAPHY N/A 04/12/2018   Procedure: RIGHT/LEFT HEART CATH AND CORONARY ANGIOGRAPHY;  Surgeon: Dixie Dials, MD;  Location: [facility];  Service: Cardiovascular;  Laterality: N/A;   Social History   Tobacco Use  . Smoking status: Never Smoker  . Smokeless tobacco: Never Used  Substance Use Topics  . Alcohol use: No  . Drug use: No    Review of Systems  HENT: Negative for sore throat and trouble swallowing.   Cardiovascular: Negative for chest pain and leg swelling.  Gastrointestinal: Negative for abdominal pain and nausea.  Endocrine: Negative for polydipsia and polyphagia.  Genitourinary: Negative for dysuria and urgency.  Musculoskeletal: Positive for back pain. Negative for joint swelling.  -Also see HPI  Patient reports no  current shortness of breath status post hospitalization patient denies chest pain or palpitations.  Patient has some dizziness/lightheadedness after initially taking her medications.  Patient denies any fever or chills.  No cough.  No headaches.  Patient admits to some anxiety as well as history of depression but believes anxiety is predominant at this time.  Patient denies suicidal thoughts or ideations.  Patient denies any dysuria.  No abdominal  pain. Objective:   Physical Exam  Constitutional: She is oriented to person, place, and time. She appears well-developed and well-nourished. No distress.  HENT:  Right Ear: Tympanic membrane, external ear and ear canal normal.  Left Ear: Tympanic membrane, external ear and ear canal normal.  Mouth/Throat: Oropharynx is clear and moist.  Mild edema of the nasal turbinates without discharge  Eyes: Conjunctivae and EOM are normal.  Neck: Normal range of motion. Neck supple. No JVD present. No thyromegaly present.  Cardiovascular: Normal rate and regular rhythm.  Peripheral pulses in the lower extremities are intact but decreased  Pulmonary/Chest: Effort normal and breath sounds normal. She has no rales.  Abdominal: Soft. There is no tenderness. There is no rebound and no guarding.  Musculoskeletal: Normal range of motion. She exhibits tenderness.  No CVA tenderness on exam but patient does have some lumbosacral discomfort to palpation  Neurological: She is alert and oriented to person, place, and time. No cranial nerve deficit.  Psychiatric: She has a normal mood and affect.  Nursing note and vitals reviewed.  BP 91/71 (BP Location: Left Arm, Patient Position: Sitting, Cuff Size: Normal)   Pulse 94   Temp 98.7 F (37.1 C) (Oral)   Resp 18   Ht '5\' 6"'  (1.676 m)   Wt 165 lb 9.6 oz (75.1 kg)   SpO2 97%   BMI 26.73 kg/m      Assessment & Plan:  1. Acute systolic heart failure ([place]) Patient reports no current shortness of breath, patient did not have any rales on examination.  Patient also without peripheral edema.  Patient reports that she has seen her cardiologist and that she is feeling much better status post recent hospitalization.  Patient will continue her current medications.  Patient states that she has received information from the hospital as well as her cardiologist regarding low-sodium diet and weighing herself daily and contacting this office or cardiology if she feels that  she is having increased fluid retention or shortness of breath.  2. Essential hypertension Patient's blood pressure is controlled on her current regimen and she should consult her cardiologist before making any changes in her blood pressure medication.  Patient is currently on Coreg, furosemide, and Entresto.  Patient will have CMP at today's visit and follow-up of her use of Lasix.  Patient is currently on 10 mEq of potassium chloride to help prevent hypokalemia associated with her use of furosemide.  LFTs are being done in follow-up of her use of atorvastatin.  3. Pre-diabetes Patient with hemoglobin A1c of 6.3 during her recent hospitalization and this is consistent with prediabetes.  Low carbohydrate diet as well as exercise as approved per her cardiologist is recommended.  Patient was unaware of diagnosis of prediabetes but I also discussed with her that all review of labs, 2 years ago patient had a hemoglobin 7.1 consistent with actual diagnosis of diabetes but patient states that she was never aware of this fact.  Patient provided with education on prediabetes as part of her after visit summary.  4. Gastroesophageal reflux disease, esophagitis presence not  specified Patient provided with refill of omeprazole 20 mg daily which she reports works well to control her GERD symptoms.  Patient is aware that she should avoid spicy/greasy foods as well as avoidance of late night eating. - omeprazole (PRILOSEC) 20 MG capsule; Take 1 capsule (20 mg total) by mouth daily.  Dispense: 90 capsule; Refill: 3  5. Encounter for long-term (current) use of medications Patient will have CMP in follow-up of use of medications for her blood pressure, especially Lasix which can lower her potassium and patient will have liver function studies as part of her CMP in follow-up of her use of atorvastatin. - Comprehensive metabolic panel  6. Screening for breast cancer Patient will be scheduled for screening mammogram as  she also reports family history of breast cancer.  -Patient was offered influenza immunization at today's visit which she declined. -Social work met with patient after her visit today to help with resources to help patient reestablish care regarding her mental health issues as well as to offer any other resources that may be available to the patient.  An After Visit Summary was printed and given to the patient.  Return in about 4 months (around 08/27/2018) for pre-diabetes and chronic issues with PCP.

## 2018-04-27 NOTE — Progress Notes (Signed)
Patient was asked if she would like a flu shot, patient refused.

## 2018-04-28 LAB — COMPREHENSIVE METABOLIC PANEL WITH GFR
ALT: 44 IU/L — ABNORMAL HIGH (ref 0–32)
AST: 33 IU/L (ref 0–40)
Albumin/Globulin Ratio: 1.6 (ref 1.2–2.2)
Albumin: 4 g/dL (ref 3.5–5.5)
Alkaline Phosphatase: 64 IU/L (ref 39–117)
BUN/Creatinine Ratio: 10 (ref 9–23)
BUN: 11 mg/dL (ref 6–24)
Bilirubin Total: 0.3 mg/dL (ref 0.0–1.2)
CO2: 20 mmol/L (ref 20–29)
Calcium: 9.2 mg/dL (ref 8.7–10.2)
Chloride: 106 mmol/L (ref 96–106)
Creatinine, Ser: 1.05 mg/dL — ABNORMAL HIGH (ref 0.57–1.00)
GFR calc Af Amer: 73 mL/min/1.73
GFR calc non Af Amer: 63 mL/min/1.73
Globulin, Total: 2.5 g/dL (ref 1.5–4.5)
Glucose: 106 mg/dL — ABNORMAL HIGH (ref 65–99)
Potassium: 4.6 mmol/L (ref 3.5–5.2)
Sodium: 140 mmol/L (ref 134–144)
Total Protein: 6.5 g/dL (ref 6.0–8.5)

## 2018-04-30 ENCOUNTER — Ambulatory Visit (HOSPITAL_BASED_OUTPATIENT_CLINIC_OR_DEPARTMENT_OTHER): Payer: Medicare Other | Attending: Family Medicine | Admitting: Internal Medicine

## 2018-04-30 VITALS — Ht 66.0 in | Wt 165.0 lb

## 2018-04-30 DIAGNOSIS — G4733 Obstructive sleep apnea (adult) (pediatric): Secondary | ICD-10-CM | POA: Diagnosis not present

## 2018-04-30 DIAGNOSIS — G4761 Periodic limb movement disorder: Secondary | ICD-10-CM | POA: Insufficient documentation

## 2018-04-30 DIAGNOSIS — Z79899 Other long term (current) drug therapy: Secondary | ICD-10-CM | POA: Diagnosis not present

## 2018-05-04 ENCOUNTER — Telehealth: Payer: Self-pay

## 2018-05-04 NOTE — Telephone Encounter (Signed)
-----   Message from Cain Saupeammie Fulp, MD sent at 04/28/2018  8:29 PM EDT ----- Please notify patient that CMP showed glucose of 106 and patient still with a minor increase in creatinine at 1.05 but this is improved from 2 weeks ago when her creatinine was 1.23.  Patient also with a very mild increase in a liver enzyme, ALT at 44 normal is 0-32.  She should try to limit her use of alcohol as well as Tylenol which may affect the liver.

## 2018-05-04 NOTE — Telephone Encounter (Signed)
Patient answered call, verified DOB and was given her most recent lab results. Patient verbalized understand and had asked if her results were in regarding her sleep study. If pcp could result those notes at her earliest convenience I will return call to patient to inform her.

## 2018-05-05 NOTE — Telephone Encounter (Signed)
It does not look like the sleep study report has been completed yet. The results are not yet in the chart. She may want to call again on Tuesday to see if the sleep specialist has posted the results by then.

## 2018-05-12 NOTE — Telephone Encounter (Signed)
Patient called requesting sleep study results, please follow up with patient.

## 2018-05-13 DIAGNOSIS — G4733 Obstructive sleep apnea (adult) (pediatric): Secondary | ICD-10-CM

## 2018-05-13 MED FILL — MAGNESIUM OXIDE 400 MG TAB: 400 | 35 days supply | Qty: 15 | Fill #1

## 2018-05-13 MED FILL — ATORVASTATIN CALCIUM 40 MG: 40 | 30 days supply | Qty: 30 | Fill #1

## 2018-05-13 MED FILL — FUROSEMIDE 20 MG TABLET: 20 | 30 days supply | Qty: 30 | Fill #1

## 2018-05-13 MED FILL — MEGESTROL 40 MG TABLET: 40 | 30 days supply | Qty: 60 | Fill #2

## 2018-05-13 MED FILL — CARVEDILOL 6.25 MG TABLET: 6.25 | 30 days supply | Qty: 60 | Fill #1

## 2018-05-13 MED FILL — POTASSIUM CHLORIDE ER 10 ME: 10 | 30 days supply | Qty: 30 | Fill #0

## 2018-05-13 NOTE — Procedures (Signed)
Patient Name: Sabrina Ortiz, Sabrina Ortiz Date: 04/30/2018 Gender: Female D.O.B: 03-20-1971 Age (years): 47 Referring Provider: Anders Simmonds PA-C Height (inches): 66 Interpreting Physician: Jetty Duhamel MD, ABSM Weight (lbs): 174 RPSGT: Lowry Ram BMI: 28 MRN: [id number] Neck Size: 15.00  CLINICAL INFORMATION The patient is referred for a CPAP titration to treat sleep apnea.  Date of NPSG, Split Night or HST: NPSG 02/27/18  AHI 10.1/ hr, desaturation to 89%, body weight 174 lbs  SLEEP STUDY TECHNIQUE As per the AASM Manual for the Scoring of Sleep and Associated Events v2.3 (April 2016) with a hypopnea requiring 4% desaturations.  The channels recorded and monitored were frontal, central and occipital EEG, electrooculogram (EOG), submentalis EMG (chin), nasal and oral airflow, thoracic and abdominal wall motion, anterior tibialis EMG, snore microphone, electrocardiogram, and pulse oximetry. Continuous positive airway pressure (CPAP) was initiated at the beginning of the study and titrated to treat sleep-disordered breathing.  MEDICATIONS Medications self-administered by patient taken the night of the study : HYDROXYZINE, MELATONIN  TECHNICIAN COMMENTS Comments added by technician: NONE Comments added by scorer: N/A RESPIRATORY PARAMETERS Optimal PAP Pressure (cm): 11 AHI at Optimal Pressure (/hr): 0.0 Overall Minimal O2 (%): 91.0 Supine % at Optimal Pressure (%): 99 Minimal O2 at Optimal Pressure (%): 91.0   SLEEP ARCHITECTURE The study was initiated at 10:32:57 PM and ended at 5:31:30 AM.  Sleep onset time was 35.2 minutes and the sleep efficiency was 88.6%%. The total sleep time was 371 minutes.  The patient spent 2.7%% of the night in stage N1 sleep, 66.6%% in stage N2 sleep, 5.7%% in stage N3 and 25.1% in REM.Stage REM latency was 75.5 minutes  Wake after sleep onset was 12.4. Alpha intrusion was absent. Supine sleep was 20.75%.  CARDIAC DATA The 2 lead EKG  demonstrated sinus rhythm. The mean heart rate was 74.5 beats per minute. Other EKG findings include: None.  LEG MOVEMENT DATA The total Periodic Limb Movements of Sleep (PLMS) were 0. The PLMS index was 0.0. A PLMS index of <15 is considered normal in adults.  IMPRESSIONS - The optimal PAP pressure was 11 cm of water. - Central sleep apnea was not noted during this titration (CAI = 0.3/h). - Significant oxygen desaturations were not observed during this titration (min O2 = 91.0%). - The patient snored with soft snoring volume during this titration study. - No cardiac abnormalities were observed during this study. - Arousals associated with PLMs were significant 7.6/ hr..  DIAGNOSIS - Obstructive Sleep Apnea (327.23 [G47.33 ICD-10]) - Periodic Limb Movement During Sleep (327.51 [G47.61 ICD-10])  RECOMMENDATIONS - Trial of CPAP therapy on 11 cm H2O or DME autopap 5-15. Patient used a Small size Resmed Full Face Mask AirFit F20 mask and heated humidification. - Be careful with alcohol, sedatives and other CNS depressants that may worsen sleep apnea and disrupt normal sleep architecture. - Sleep hygiene should be reviewed to assess factors that may improve sleep quality. - Weight management and regular exercise should be initiated or continued. - If sleep disturbance from limb movement during sleep persists, consider therapeutic trial of Requip or Mirapex.  [Electronically signed] 05/13/2018 01:45 PM  Jetty Duhamel MD, ABSM Diplomate, American Board of Sleep Medicine   NPI: [id number]                          Jetty Duhamel Diplomate, American Board of Sleep Medicine  ELECTRONICALLY SIGNED ON:  05/13/2018, 1:42 PM [facility]  PH: [phone]   FX: [phone] [place]

## 2018-05-15 ENCOUNTER — Other Ambulatory Visit: Payer: Self-pay | Admitting: Internal Medicine

## 2018-05-15 DIAGNOSIS — G4733 Obstructive sleep apnea (adult) (pediatric): Secondary | ICD-10-CM

## 2018-05-17 ENCOUNTER — Encounter: Payer: Self-pay | Admitting: Obstetrics and Gynecology

## 2018-05-17 ENCOUNTER — Ambulatory Visit (INDEPENDENT_AMBULATORY_CARE_PROVIDER_SITE_OTHER): Payer: Medicare Other | Admitting: Obstetrics and Gynecology

## 2018-05-17 VITALS — BP 117/78 | HR 85 | Wt 169.3 lb

## 2018-05-17 DIAGNOSIS — N938 Other specified abnormal uterine and vaginal bleeding: Secondary | ICD-10-CM

## 2018-05-17 DIAGNOSIS — Z3042 Encounter for surveillance of injectable contraceptive: Secondary | ICD-10-CM | POA: Diagnosis not present

## 2018-05-17 MED ORDER — MEDROXYPROGESTERONE ACETATE 150 MG/ML IM SUSP: 150.0000 mg | INTRAMUSCULAR | 5 refills | Status: DC

## 2018-05-17 MED ORDER — MEDROXYPROGESTERONE ACETATE 150 MG/ML IM SUSP
150.0000 mg | Freq: Once | INTRAMUSCULAR | Status: AC
  Administered 2018-05-17: 150 mg via INTRAMUSCULAR

## 2018-05-17 NOTE — Progress Notes (Signed)
47 yo with history of DUB medically managed with megace with good results presenting today requesting a change in medical management. Patient was seen in ED for evaluation of tachycardia. Patient states that both ED physician and cardiologist recommended that megace was discontinued as it may be responsible for her cardiac symptoms. Patient reports last not taking megace since July and experienced vaginal spotting in August. She took one pill yesterday as she started to experience severe cramping. She denies any pelvic pain or abnormal discharge  Past Medical History:  Diagnosis Date  . Angio-edema   . Anxiety   . Chronic back pain   . Depression   . Hypertension   . Mental disorder   . PTSD (post-traumatic stress disorder)   . Urticaria    Past Surgical History:  Procedure Laterality Date  . CYST REMOVAL HAND    . RIGHT/LEFT HEART CATH AND CORONARY ANGIOGRAPHY N/A 04/12/2018   Procedure: RIGHT/LEFT HEART CATH AND CORONARY ANGIOGRAPHY;  Surgeon: Orpah Cobb, MD;  Location: [facility];  Service: Cardiovascular;  Laterality: N/A;   Family History  Problem Relation Age of Onset  . Breast cancer Mother    Social History   Tobacco Use  . Smoking status: Never Smoker  . Smokeless tobacco: Never Used  Substance Use Topics  . Alcohol use: No  . Drug use: No   ROS See pertinent in HPI  Blood pressure 117/78, pulse 85, weight 169 lb 4.8 oz (76.8 kg). GENERAL: Well-developed, well-nourished female in no acute distress.  EXTREMITIES: No cyanosis, clubbing, or edema, 2+ distal pulses. NEURO: alert and oriented x3   A/P 47 yo with DUB here for alternative treatment to megace - Discussed observation as an option as patient experienced light bleeding without megace in august. Patient is fearful of return of DUB again - Discussed Mirena IUD vs depo-provera. Patient opted for depo-provera. Rx provided - Patient is due for annual exam. She will return in 3 months for annual exam with  pap smear and follow up on DUB on depo-provera - RTC in 3 months

## 2018-05-27 ENCOUNTER — Telehealth: Payer: Self-pay | Admitting: Family Medicine

## 2018-05-27 NOTE — Telephone Encounter (Signed)
Pt [facility] to [facility]: [phone] Ext: [phone] and [phone]

## 2018-05-27 NOTE — Telephone Encounter (Signed)
Pt called to request an update on her C-PAP machine please follow up.

## 2018-06-03 MED FILL — OMEPRAZOLE 20 MG CAP: 20 | 30 days supply | Qty: 30 | Fill #2

## 2018-06-13 ENCOUNTER — Telehealth: Payer: Self-pay

## 2018-06-13 NOTE — Telephone Encounter (Signed)
Call placed to [facility] - spoke to [place] who confirmed that the CPAP was set up at the patient's home on 06/03/18.  Call then placed to the patient and she said that she has received the machine and is going to [facility] today to have the " code" on the machine checked.

## 2018-06-20 MED FILL — FUROSEMIDE 20 MG TABLET: 20 | 30 days supply | Qty: 30 | Fill #2

## 2018-06-20 MED FILL — POTASSIUM CHLORIDE ER 10 ME: 10 | 30 days supply | Qty: 30 | Fill #1

## 2018-06-20 MED FILL — CARVEDILOL 6.25 MG TABLET: 6.25 | 30 days supply | Qty: 60 | Fill #2

## 2018-06-20 MED FILL — MAGNESIUM OXIDE 400 MG TAB: 400 | 35 days supply | Qty: 15 | Fill #2

## 2018-07-07 ENCOUNTER — Telehealth: Payer: Self-pay | Admitting: Family Medicine

## 2018-07-07 NOTE — Telephone Encounter (Signed)
Patient says that [facility] that per her insurance she will need to follow up regarding her cpap machine. Please follow up with patient.

## 2018-07-08 ENCOUNTER — Encounter: Payer: Self-pay | Admitting: Family Medicine

## 2018-07-08 ENCOUNTER — Other Ambulatory Visit: Payer: Self-pay

## 2018-07-08 ENCOUNTER — Ambulatory Visit: Payer: Medicare Other | Attending: Family Medicine | Admitting: Family Medicine

## 2018-07-08 VITALS — BP 126/86 | HR 88 | Temp 98.4°F | Resp 18 | Ht 66.0 in | Wt 174.2 lb

## 2018-07-08 DIAGNOSIS — Z9889 Other specified postprocedural states: Secondary | ICD-10-CM | POA: Insufficient documentation

## 2018-07-08 DIAGNOSIS — Z9989 Dependence on other enabling machines and devices: Secondary | ICD-10-CM

## 2018-07-08 DIAGNOSIS — Z853 Personal history of malignant neoplasm of breast: Secondary | ICD-10-CM | POA: Diagnosis not present

## 2018-07-08 DIAGNOSIS — G4733 Obstructive sleep apnea (adult) (pediatric): Secondary | ICD-10-CM | POA: Diagnosis not present

## 2018-07-08 DIAGNOSIS — G8929 Other chronic pain: Secondary | ICD-10-CM | POA: Diagnosis not present

## 2018-07-08 DIAGNOSIS — E559 Vitamin D deficiency, unspecified: Secondary | ICD-10-CM

## 2018-07-08 DIAGNOSIS — F4310 Post-traumatic stress disorder, unspecified: Secondary | ICD-10-CM | POA: Insufficient documentation

## 2018-07-08 DIAGNOSIS — M549 Dorsalgia, unspecified: Secondary | ICD-10-CM | POA: Diagnosis not present

## 2018-07-08 DIAGNOSIS — Z91013 Allergy to seafood: Secondary | ICD-10-CM | POA: Insufficient documentation

## 2018-07-08 DIAGNOSIS — I10 Essential (primary) hypertension: Secondary | ICD-10-CM | POA: Insufficient documentation

## 2018-07-08 DIAGNOSIS — R5383 Other fatigue: Secondary | ICD-10-CM | POA: Diagnosis present

## 2018-07-08 MED ORDER — VITAMIN D (ERGOCALCIFEROL) 1.25 MG (50000 UNIT) PO CAPS: 50000.0000 [IU] | ORAL_CAPSULE | ORAL | 0 refills | Status: DC

## 2018-07-08 NOTE — Progress Notes (Signed)
Subjective:    Patient ID: Sabrina Ortiz, female    DOB: 1971-05-07, 47 y.o.   MRN: [id number]  HPI 47 year old female seen in follow-up of abnormal sleep study which revealed sleep apnea.  Patient has obtained her CPAP and is now using it nightly.  Patient reports that she now falls asleep easily after putting on her CPAP mask, patient is using the nasal cannula mask.  Patient also states that her sleep is more restful.  Patient states that she actually wakes up earlier than in the past and patient states that she feels as if she has energy when she wakes up.  Patient states that she loves her CPAP machine.      Patient does have some fatigue and muscle aches.  Patient states that she has not taken the vitamin D that was previously prescribed.  Patient would like a new prescription sent in.  Patient states that the last prescription went to a local pharmacy and she wants to have prescription sent to the pharmacy here in this building.  Patient also with complaint of some increase in back pain secondary to changes in the weather but patient states that she was told not to take any over-the-counter pain medications for her back pain secondary to her other medical conditions. Past Medical History:  Diagnosis Date  . Angio-edema   . Anxiety   . Chronic back pain   . Depression   . Hypertension   . Mental disorder   . PTSD (post-traumatic stress disorder)   . Urticaria    Past Surgical History:  Procedure Laterality Date  . CYST REMOVAL HAND    . RIGHT/LEFT HEART CATH AND CORONARY ANGIOGRAPHY N/A 04/12/2018   Procedure: RIGHT/LEFT HEART CATH AND CORONARY ANGIOGRAPHY;  Surgeon: Orpah Cobb, MD;  Location: [facility];  Service: Cardiovascular;  Laterality: N/A;   Family History  Problem Relation Age of Onset  . Breast cancer Mother    Family History  Problem Relation Age of Onset  . Breast cancer Mother    Allergies  Allergen Reactions  . Shellfish Allergy     Swelling up  throat. Eat seafood, her throat may swell up when eat 2-3x a month.      Review of Systems  Constitutional: Positive for fatigue (Improved). Negative for chills and fever.  Respiratory: Negative for cough and shortness of breath.   Cardiovascular: Negative for chest pain, palpitations and leg swelling.  Gastrointestinal: Negative for abdominal pain, constipation, diarrhea and nausea.  Genitourinary: Negative for dysuria and frequency.  Musculoskeletal: Positive for back pain. Negative for gait problem.  Neurological: Negative for dizziness and headaches.       Objective:   Physical Exam BP 126/86   Pulse 88   Temp 98.4 F (36.9 C) (Oral)   Resp 18   Ht 5\' 6"  (1.676 m)   Wt 174 lb 3.2 oz (79 kg)   SpO2 98%   BMI 28.12 kg/m Nurse's notes and vital signs reviewed General-well-nourished, well-developed older female in no acute distress.  Patient is accompanied by her 91-year-old granddaughter at today's visit Lungs-clear to auscultation bilaterally Mouth-patient with a narrowed posterior airway and large tongue base Neck-supple, no lymphadenopathy, no thyromegaly Cardiovascular-regular rate and rhythm Abdomen-soft, nontender Extremities-no edema Back-no CVA tenderness, patient with some lumbosacral discomfort to palpation        Assessment & Plan:  1. OSA on CPAP Patient has had recent abnormal sleep study showing that she has obstructive sleep apnea.  Patient  has started using CPAP and states that her sleep quality is greatly improved and patient with less fatigue.  Patient will continue use of CPAP on a nightly basis.  2. Vitamin D deficiency Patient's vitamin D level in May of this year was 10 and patient states that she has not yet started vitamin D replacement therapy.  Prescription is being sent to the pharmacy in this building for patient to pick up and start today.  Patient will take vitamin D once weekly.  Level will be rechecked in 4 to 6 months. - Vitamin D,  Ergocalciferol, (DRISDOL) 50000 units CAPS capsule; Take 1 capsule (50,000 Units total) by mouth every 7 (seven) days.  Dispense: 16 capsule; Refill: 0  *Patient was offered but declined influenza immunization  An After Visit Summary was printed and given to the patient.  Return in about 2 months (around 09/07/2018) for Jan 2020 f/u DM/HTN. Patient reports that she has a follow-up visit scheduled for this December which she will keep

## 2018-07-08 NOTE — Progress Notes (Signed)
Pain: back pain, weather change 7  Vitamin D refill.  cpap follow up

## 2018-07-22 ENCOUNTER — Telehealth: Payer: Self-pay | Admitting: Obstetrics and Gynecology

## 2018-07-22 NOTE — Telephone Encounter (Signed)
Patient has an appointment to come in on 11/29 for a depo injection. She stated she needed to get a prescription before she comes in to pick up her depo medication. She has an annual scheduled in December, and does not want to bleed.

## 2018-07-26 NOTE — Telephone Encounter (Signed)
Returned pt's call regarding her prescription for depo.  Pt did not pick up.  Left message informing pt that, if she continues to have questions or concerns, she can call the office.

## 2018-08-02 ENCOUNTER — Ambulatory Visit: Payer: Self-pay

## 2018-08-08 MED FILL — FUROSEMIDE 20 MG TABLET: 20 | 30 days supply | Qty: 30 | Fill #3

## 2018-08-08 MED FILL — POTASSIUM CHLORIDE ER 10 ME: 10 | 30 days supply | Qty: 30 | Fill #2

## 2018-08-08 MED FILL — MAGNESIUM OXIDE 400 MG TAB: 400 | 35 days supply | Qty: 15 | Fill #3

## 2018-08-10 MED FILL — OMEPRAZOLE 20 MG CAP: 20 | 30 days supply | Qty: 30 | Fill #0

## 2018-08-17 ENCOUNTER — Ambulatory Visit: Payer: Self-pay | Admitting: Obstetrics and Gynecology

## 2018-08-22 ENCOUNTER — Ambulatory Visit: Payer: Self-pay | Admitting: Family Medicine

## 2018-09-13 ENCOUNTER — Ambulatory Visit: Payer: Self-pay | Admitting: Family Medicine

## 2018-09-14 ENCOUNTER — Other Ambulatory Visit: Payer: Self-pay

## 2018-09-14 ENCOUNTER — Emergency Department (HOSPITAL_COMMUNITY): Payer: Medicare Other

## 2018-09-14 ENCOUNTER — Inpatient Hospital Stay (HOSPITAL_COMMUNITY)
Admission: EM | Admit: 2018-09-14 | Discharge: 2018-09-18 | DRG: 291 | Disposition: A | Discharge status: Home / Self Care | Payer: Medicare Other | Attending: Cardiovascular Disease | Admitting: Cardiovascular Disease

## 2018-09-14 ENCOUNTER — Encounter (HOSPITAL_COMMUNITY): Payer: Self-pay | Admitting: Emergency Medicine

## 2018-09-14 DIAGNOSIS — E785 Hyperlipidemia, unspecified: Secondary | ICD-10-CM | POA: Diagnosis present

## 2018-09-14 DIAGNOSIS — I361 Nonrheumatic tricuspid (valve) insufficiency: Secondary | ICD-10-CM | POA: Diagnosis not present

## 2018-09-14 DIAGNOSIS — Z9111 Patient's noncompliance with dietary regimen: Secondary | ICD-10-CM | POA: Diagnosis not present

## 2018-09-14 DIAGNOSIS — J9601 Acute respiratory failure with hypoxia: Secondary | ICD-10-CM | POA: Diagnosis present

## 2018-09-14 DIAGNOSIS — N179 Acute kidney failure, unspecified: Secondary | ICD-10-CM | POA: Diagnosis present

## 2018-09-14 DIAGNOSIS — I5023 Acute on chronic systolic (congestive) heart failure: Secondary | ICD-10-CM | POA: Diagnosis not present

## 2018-09-14 DIAGNOSIS — R0689 Other abnormalities of breathing: Secondary | ICD-10-CM | POA: Diagnosis not present

## 2018-09-14 DIAGNOSIS — Z91013 Allergy to seafood: Secondary | ICD-10-CM

## 2018-09-14 DIAGNOSIS — I340 Nonrheumatic mitral (valve) insufficiency: Secondary | ICD-10-CM | POA: Diagnosis not present

## 2018-09-14 DIAGNOSIS — I420 Dilated cardiomyopathy: Secondary | ICD-10-CM | POA: Diagnosis not present

## 2018-09-14 DIAGNOSIS — Z7982 Long term (current) use of aspirin: Secondary | ICD-10-CM

## 2018-09-14 DIAGNOSIS — I509 Heart failure, unspecified: Secondary | ICD-10-CM | POA: Diagnosis not present

## 2018-09-14 DIAGNOSIS — E559 Vitamin D deficiency, unspecified: Secondary | ICD-10-CM | POA: Diagnosis present

## 2018-09-14 DIAGNOSIS — J449 Chronic obstructive pulmonary disease, unspecified: Secondary | ICD-10-CM | POA: Diagnosis present

## 2018-09-14 DIAGNOSIS — I25110 Atherosclerotic heart disease of native coronary artery with unstable angina pectoris: Secondary | ICD-10-CM | POA: Diagnosis not present

## 2018-09-14 DIAGNOSIS — F4310 Post-traumatic stress disorder, unspecified: Secondary | ICD-10-CM | POA: Diagnosis present

## 2018-09-14 DIAGNOSIS — I370 Nonrheumatic pulmonary valve stenosis: Secondary | ICD-10-CM | POA: Diagnosis not present

## 2018-09-14 DIAGNOSIS — R0902 Hypoxemia: Secondary | ICD-10-CM | POA: Diagnosis not present

## 2018-09-14 DIAGNOSIS — R000 Tachycardia, unspecified: Secondary | ICD-10-CM | POA: Diagnosis not present

## 2018-09-14 DIAGNOSIS — J80 Acute respiratory distress syndrome: Secondary | ICD-10-CM | POA: Diagnosis not present

## 2018-09-14 DIAGNOSIS — Z803 Family history of malignant neoplasm of breast: Secondary | ICD-10-CM

## 2018-09-14 DIAGNOSIS — I110 Hypertensive heart disease with heart failure: Secondary | ICD-10-CM | POA: Diagnosis present

## 2018-09-14 DIAGNOSIS — R0989 Other specified symptoms and signs involving the circulatory and respiratory systems: Secondary | ICD-10-CM | POA: Diagnosis not present

## 2018-09-14 DIAGNOSIS — M549 Dorsalgia, unspecified: Secondary | ICD-10-CM | POA: Diagnosis present

## 2018-09-14 DIAGNOSIS — G8929 Other chronic pain: Secondary | ICD-10-CM | POA: Diagnosis present

## 2018-09-14 DIAGNOSIS — I10 Essential (primary) hypertension: Secondary | ICD-10-CM | POA: Diagnosis not present

## 2018-09-14 HISTORY — DX: Chronic obstructive pulmonary disease, unspecified: J44.9

## 2018-09-14 LAB — BASIC METABOLIC PANEL
Anion gap: 10 (ref 5–15)
BUN: 12 mg/dL (ref 6–20)
CO2: 19 mmol/L — ABNORMAL LOW (ref 22–32)
Calcium: 8.9 mg/dL (ref 8.9–10.3)
Chloride: 110 mmol/L (ref 98–111)
Creatinine, Ser: 1.02 mg/dL — ABNORMAL HIGH (ref 0.44–1.00)
GFR calc Af Amer: >60 mL/min (ref >60)
GFR calc non Af Amer: >60 mL/min (ref >60)
Glucose, Bld: 145 mg/dL — ABNORMAL HIGH (ref 70–99)
Potassium: 3.7 mmol/L (ref 3.5–5.1)
Sodium: 139 mmol/L (ref 135–145)

## 2018-09-14 LAB — CBC WITH DIFFERENTIAL/PLATELET
Abs Immature Granulocytes: 0.02 10*3/uL (ref 0.00–0.07)
Basophils Absolute: 0 10*3/uL (ref 0.0–0.1)
Basophils Relative: 1 %
Eosinophils Absolute: 0.2 10*3/uL (ref 0.0–0.5)
Eosinophils Relative: 3 %
HCT: 43.3 % (ref 36.0–46.0)
Hemoglobin: 13.4 g/dL (ref 12.0–15.0)
Immature Granulocytes: 0 %
Lymphocytes Relative: 40 %
Lymphs Abs: 3.2 10*3/uL (ref 0.7–4.0)
MCH: 26.1 pg (ref 26.0–34.0)
MCHC: 30.9 g/dL (ref 30.0–36.0)
MCV: 84.4 fL (ref 80.0–100.0)
Monocytes Absolute: 0.5 10*3/uL (ref 0.1–1.0)
Monocytes Relative: 6 %
Neutro Abs: 4.2 10*3/uL (ref 1.7–7.7)
Neutrophils Relative %: 50 %
Platelets: 198 10*3/uL (ref 150–400)
RBC: 5.13 MIL/uL — ABNORMAL HIGH (ref 3.87–5.11)
RDW: 15.3 % (ref 11.5–15.5)
WBC: 8.2 10*3/uL (ref 4.0–10.5)
nRBC: 0 % (ref 0.0–0.2)

## 2018-09-14 LAB — BRAIN NATRIURETIC PEPTIDE: B Natriuretic Peptide: 576.4 pg/mL — ABNORMAL HIGH (ref 0.0–100.0)

## 2018-09-14 LAB — TROPONIN I: Troponin I: <0.03 ng/mL (ref <0.03)

## 2018-09-14 MED ORDER — NITROGLYCERIN IN D5W 200-5 MCG/ML-% IV SOLN
0.0000 ug/min | INTRAVENOUS | Status: DC
  Administered 2018-09-14: 10 ug/min via INTRAVENOUS
  Filled 2018-09-14: qty 250

## 2018-09-14 MED ORDER — FUROSEMIDE 10 MG/ML IJ SOLN
80.0000 mg | Freq: Once | INTRAMUSCULAR | Status: AC
  Administered 2018-09-14: 80 mg via INTRAVENOUS
  Filled 2018-09-14: qty 8

## 2018-09-14 MED ORDER — ASPIRIN EC 81 MG PO TBEC
81.0000 mg | DELAYED_RELEASE_TABLET | Freq: Every day | ORAL | Status: DC
  Administered 2018-09-15 – 2018-09-18 (×4): 81 mg via ORAL
  Filled 2018-09-14 (×4): qty 1

## 2018-09-14 MED ORDER — HEPARIN SODIUM (PORCINE) 5000 UNIT/ML IJ SOLN
5000.0000 [IU] | Freq: Three times a day (TID) | INTRAMUSCULAR | Status: DC
  Administered 2018-09-15 – 2018-09-18 (×11): 5000 [IU] via SUBCUTANEOUS
  Filled 2018-09-14 (×11): qty 1

## 2018-09-14 MED ORDER — SODIUM CHLORIDE 0.9% FLUSH
3.0000 mL | Freq: Two times a day (BID) | INTRAVENOUS | Status: DC
  Administered 2018-09-15 – 2018-09-18 (×8): 3 mL via INTRAVENOUS

## 2018-09-14 MED ORDER — SODIUM CHLORIDE 0.9 % IV SOLN
250.0000 mL | INTRAVENOUS | Status: DC | PRN
  Administered 2018-09-15: 250 mL via INTRAVENOUS

## 2018-09-14 MED ORDER — SODIUM CHLORIDE 0.9% FLUSH: 3.0000 mL | INTRAVENOUS | Status: DC | PRN

## 2018-09-14 MED ORDER — ONDANSETRON HCL 4 MG/2ML IJ SOLN: 4.0000 mg | Freq: Four times a day (QID) | INTRAMUSCULAR | Status: DC | PRN

## 2018-09-14 MED ORDER — ACETAMINOPHEN 325 MG PO TABS: 650.0000 mg | ORAL_TABLET | ORAL | Status: DC | PRN

## 2018-09-14 MED ORDER — FUROSEMIDE 10 MG/ML IJ SOLN
40.0000 mg | Freq: Two times a day (BID) | INTRAMUSCULAR | Status: DC
  Administered 2018-09-15 – 2018-09-17 (×5): 40 mg via INTRAVENOUS
  Filled 2018-09-14 (×5): qty 4

## 2018-09-14 NOTE — ED Notes (Signed)
ED Provider at bedside. 

## 2018-09-14 NOTE — ED Triage Notes (Signed)
Per EMS, pt from home. Pt c/o sob for the last couple of days and became worse over the last hour. Pt was in a tripod position upon ems arrival. Crackles noted to bilateral lower lungs per ems. EMS gave 1 nitro for BP with no relief. Hx of CHF.  EMS VS BP 172/124, HR 130s sinus, 87% on room air, pt does not use oxygen at baseline. Pt on 96% on CPAP. CBG 154.

## 2018-09-14 NOTE — H&P (Signed)
Referring Physician:  [name] is an 48 y.o. female.                       Chief Complaint: Shortness of breath  HPI: 48 year old female with PMH of dilated cardiomyopathy has 3 day history of worsening shortness of breath. She denies fever or cough. She had normal coronaries on cardiac catheterization but her LV function was poor by echocardiogram. EKG showed sinus tachycardia, LVH, biatrial enlargement. Chest x-ray shows cardiomegaly with vascular congestion and mild pulmonary edema.  Past Medical History:  Diagnosis Date  . Angio-edema   . Anxiety   . Chronic back pain   . COPD (chronic obstructive pulmonary disease) (HCC)   . Depression   . Hypertension   . Mental disorder   . PTSD (post-traumatic stress disorder)   . Urticaria       Past Surgical History:  Procedure Laterality Date  . CYST REMOVAL HAND    . RIGHT/LEFT HEART CATH AND CORONARY ANGIOGRAPHY N/A 04/12/2018   Procedure: RIGHT/LEFT HEART CATH AND CORONARY ANGIOGRAPHY;  Surgeon: Orpah Cobb, MD;  Location: [facility];  Service: Cardiovascular;  Laterality: N/A;    Family History  Problem Relation Age of Onset  . Breast cancer Mother    Social History:  reports that she has never smoked. She has never used smokeless tobacco. She reports that she does not drink alcohol or use drugs.  Allergies:  Allergies  Allergen Reactions  . Shellfish Allergy     Swelling up throat. Eat seafood, her throat may swell up when eat 2-3x a month.     (Not in a hospital admission)   Results for orders placed or performed during the hospital encounter of 09/14/18 (from the past 48 hour(s))  Basic metabolic panel     Status: Abnormal   Collection Time: 09/14/18  8:28 PM  Result Value Ref Range   Sodium 139 135 - 145 mmol/L   Potassium 3.7 3.5 - 5.1 mmol/L   Chloride 110 98 - 111 mmol/L   CO2 19 (L) 22 - 32 mmol/L   Glucose, Bld 145 (H) 70 - 99 mg/dL   BUN 12 6 - 20 mg/dL   Creatinine, Ser 3.95 (H) 0.44 -  1.00 mg/dL   Calcium 8.9 8.9 - 32.0 mg/dL   GFR calc non Af Amer >60 >60 mL/min   GFR calc Af Amer >60 >60 mL/min   Anion gap 10 5 - 15    Comment: Performed at [facility], [address], [place], [place] [address]  Brain natriuretic peptide     Status: Abnormal   Collection Time: 09/14/18  8:28 PM  Result Value Ref Range   B Natriuretic Peptide 576.4 (H) 0.0 - 100.0 pg/mL    Comment: Performed at [facility], [address] [place], [place] [address]  Troponin I - ONCE - STAT     Status: None   Collection Time: 09/14/18  8:28 PM  Result Value Ref Range   Troponin I <0.03 <0.03 ng/mL    Comment: Performed at [facility], [address] [place], [place] [address]  CBC with Differential     Status: Abnormal   Collection Time: 09/14/18  8:28 PM  Result Value Ref Range   WBC 8.2 4.0 - 10.5 K/uL   RBC 5.13 (H) 3.87 - 5.11 MIL/uL   Hemoglobin 13.4 12.0 - 15.0 g/dL   HCT 90.2 11.1 -  46.0 %   MCV 84.4 80.0 - 100.0 fL   MCH 26.1 26.0 - 34.0 pg   MCHC 30.9 30.0 - 36.0 g/dL   RDW 16.115.3 09.611.5 - 04.515.5 %   Platelets 198 150 - 400 K/uL   nRBC 0.0 0.0 - 0.2 %   Neutrophils Relative % 50 %   Neutro Abs 4.2 1.7 - 7.7 K/uL   Lymphocytes Relative 40 %   Lymphs Abs 3.2 0.7 - 4.0 K/uL   Monocytes Relative 6 %   Monocytes Absolute 0.5 0.1 - 1.0 K/uL   Eosinophils Relative 3 %   Eosinophils Absolute 0.2 0.0 - 0.5 K/uL   Basophils Relative 1 %   Basophils Absolute 0.0 0.0 - 0.1 K/uL   Immature Granulocytes 0 %   Abs Immature Granulocytes 0.02 0.00 - 0.07 K/uL    Comment: Performed at [facility], [address] [place], [place] [id number]   Dg Chest Portable 1 View  Result Date: 09/14/2018 CLINICAL DATA:  Dyspnea EXAM: PORTABLE CHEST 1 VIEW COMPARISON:  04/10/2018 FINDINGS: Cardiomegaly with central vascular congestion and mild diffuse interstitial and ground-glass opacities suspicious for edema. No large effusion. No pneumothorax. IMPRESSION: Cardiomegaly with  vascular congestion and mild diffuse interstitial and ground-glass opacity suggesting mild pulmonary edema. Electronically Signed   By: Jasmine PangKim  Fujinaga M.D.   On: 09/14/2018 20:41    Review Of Systems Constitutional: No fever, chills, weight loss or gain. Eyes: No vision change, wears glasses. No discharge or pain. Ears: No hearing loss, No tinnitus. Respiratory: No asthma, COPD, pneumonias. Positive shortness of breath. No hemoptysis. Cardiovascular: Positive chest pain, palpitation, no leg edema. Gastrointestinal: No nausea, vomiting, diarrhea, constipation. No GI bleed. No hepatitis. Genitourinary: No dysuria, hematuria, kidney stone. No incontinance. Neurological: No headache, stroke, seizures.  Psychiatry: No psych facility admission for anxiety, depression, suicide. No detox. Skin: No rash. Musculoskeletal: Positive joint pain, fibromyalgia. No neck pain, back pain. Lymphadenopathy: No lymphadenopathy. Hematology: No anemia or easy bruising.   Blood pressure 108/63, pulse (!) 54, temperature 98.5 F (36.9 C), temperature source Axillary, resp. rate (!) 24, height 5\' 5"  (1.651 m), weight 79.4 kg, SpO2 100 %. Body mass index is 29.12 kg/m. General appearance: alert, cooperative, appears stated age and moderate distress Head: Normocephalic, atraumatic. Eyes: Brown eyes, pink conjunctiva, corneas clear. PERRL, EOM's intact. Neck: No adenopathy, no carotid bruit, + JVD, supple, symmetrical, trachea midline and thyroid not enlarged. Resp: Crackles to auscultation bilaterally. Cardio: Regular rate and rhythm, S1, S2 normal, II/VI systolic murmur, no click, rub or gallop GI: Soft, non-tender; bowel sounds normal; no organomegaly. Extremities: No edema, cyanosis or clubbing. Skin: Warm and dry.  Neurologic: Alert and oriented X 3, normal strength. Normal coordination and gait.  Assessment/Plan Acute systolic left heart failure, HFrEF Dilated nonischemic  cardiomyopathy Hypertension Hyperlipidemia  Admit. IV diuresis.  Sabrina RodriguezAjay S Lyndel Sarate, MD  09/14/2018, 10:52 PM

## 2018-09-15 ENCOUNTER — Inpatient Hospital Stay (HOSPITAL_COMMUNITY): Payer: Medicare Other

## 2018-09-15 DIAGNOSIS — I370 Nonrheumatic pulmonary valve stenosis: Secondary | ICD-10-CM

## 2018-09-15 DIAGNOSIS — I361 Nonrheumatic tricuspid (valve) insufficiency: Secondary | ICD-10-CM

## 2018-09-15 DIAGNOSIS — I340 Nonrheumatic mitral (valve) insufficiency: Secondary | ICD-10-CM

## 2018-09-15 LAB — BASIC METABOLIC PANEL
Anion gap: 8 (ref 5–15)
BUN: 9 mg/dL (ref 6–20)
CO2: 24 mmol/L (ref 22–32)
Calcium: 8.7 mg/dL — ABNORMAL LOW (ref 8.9–10.3)
Chloride: 105 mmol/L (ref 98–111)
Creatinine, Ser: 1.02 mg/dL — ABNORMAL HIGH (ref 0.44–1.00)
GFR calc Af Amer: >60 mL/min (ref >60)
GFR calc non Af Amer: >60 mL/min (ref >60)
Glucose, Bld: 136 mg/dL — ABNORMAL HIGH (ref 70–99)
POTASSIUM: 3.4 mmol/L — AB (ref 3.5–5.1)
Sodium: 137 mmol/L (ref 135–145)

## 2018-09-15 LAB — ECHOCARDIOGRAM COMPLETE
Height: 65 in
Weight: 2797.2 oz

## 2018-09-15 LAB — MRSA PCR SCREENING: MRSA by PCR: NEGATIVE

## 2018-09-15 LAB — TROPONIN I
Troponin I: <0.03 ng/mL (ref <0.03)
Troponin I: <0.03 ng/mL (ref <0.03)
Troponin I: <0.03 ng/mL (ref <0.03)

## 2018-09-15 MED ORDER — POTASSIUM CHLORIDE ER 10 MEQ PO TBCR
10.0000 meq | EXTENDED_RELEASE_TABLET | Freq: Three times a day (TID) | ORAL | Status: DC
  Administered 2018-09-15: 10 meq via ORAL
  Filled 2018-09-15: qty 1

## 2018-09-15 MED ORDER — SACUBITRIL-VALSARTAN 24-26 MG PO TABS
1.0000 | ORAL_TABLET | Freq: Two times a day (BID) | ORAL | Status: DC
  Administered 2018-09-15 – 2018-09-18 (×7): 1 via ORAL
  Filled 2018-09-15 (×7): qty 1

## 2018-09-15 MED ORDER — POTASSIUM CHLORIDE CRYS ER 10 MEQ PO TBCR
10.0000 meq | EXTENDED_RELEASE_TABLET | Freq: Three times a day (TID) | ORAL | Status: DC
  Administered 2018-09-16 – 2018-09-17 (×4): 10 meq via ORAL
  Filled 2018-09-15 (×8): qty 1

## 2018-09-15 MED ORDER — POTASSIUM CHLORIDE CRYS ER 20 MEQ PO TBCR
20.0000 meq | EXTENDED_RELEASE_TABLET | Freq: Three times a day (TID) | ORAL | Status: AC
  Administered 2018-09-15 (×3): 20 meq via ORAL
  Filled 2018-09-15 (×3): qty 1

## 2018-09-15 MED ORDER — MUSCLE RUB 10-15 % EX CREA
TOPICAL_CREAM | CUTANEOUS | Status: DC | PRN
  Filled 2018-09-15: qty 85

## 2018-09-15 NOTE — Progress Notes (Signed)
Ref: Cain Saupe, MD   Subjective:  Awake. She is off BiPAP. She has moderate respiratory distress. She admits to dietary non-compliance over the holidays. Echocardiogram shows dilated cardiomyopathy. Her coronaries were normal on cardiac cath last year.  Objective:  Vital Signs in the last 24 hours: Temp:  [98.2 F (36.8 C)-98.5 F (36.9 C)] 98.4 F (36.9 C) (01/09 0400) Pulse Rate:  [45-138] 94 (01/09 0900) Cardiac Rhythm: Normal sinus rhythm (01/09 0800) Resp:  [19-28] 28 (01/09 0900) BP: (108-158)/(62-134) 123/90 (01/09 0900) SpO2:  [88 %-100 %] 88 % (01/09 0900) Weight:  [78.1 kg-79.4 kg] 79.3 kg (01/09 0645)  Physical Exam: BP Readings from Last 1 Encounters:  09/15/18 123/90     Wt Readings from Last 1 Encounters:  09/15/18 79.3 kg    Weight change:  Body mass index is 29.09 kg/m. HEENT: [place]/AT, Eyes-Brown, PERL, EOMI, Conjunctiva-Pink, Sclera-Non-icteric Neck: + JVD, No bruit, Trachea midline. Lungs:  Clearing, Bilateral. Cardiac:  Regular rhythm, normal S1 and S2, no S3. II/VI systolic murmur. Abdomen:  Soft, non-tender. BS present. Extremities:  No edema present. No cyanosis. No clubbing. CNS: AxOx3, Cranial nerves grossly intact, moves all 4 extremities.  Skin: Warm and dry.   Intake/Output from previous day: 01/08 0701 - 01/09 0700 In: 542.2 [P.O.:500; I.V.:42.2] Out: 1550 [Urine:1550]    Lab Results: BMET    Component Value Date/Time   NA 137 09/15/2018 0517   NA 139 09/14/2018 2028   NA 140 04/27/2018 1156   NA 137 04/13/2018 0358   NA 140 03/29/2018 1540   NA 142 01/27/2018 1638   K 3.4 (L) 09/15/2018 0517   K 3.7 09/14/2018 2028   K 4.6 04/27/2018 1156   CL 105 09/15/2018 0517   CL 110 09/14/2018 2028   CL 106 04/27/2018 1156   CO2 24 09/15/2018 0517   CO2 19 (L) 09/14/2018 2028   CO2 20 04/27/2018 1156   GLUCOSE 136 (H) 09/15/2018 0517   GLUCOSE 145 (H) 09/14/2018 2028   GLUCOSE 106 (H) 04/27/2018 1156   GLUCOSE 123 (H) 04/13/2018  0358   BUN 9 09/15/2018 0517   BUN 12 09/14/2018 2028   BUN 11 04/27/2018 1156   BUN 9 04/13/2018 0358   BUN 13 03/29/2018 1540   BUN 13 01/27/2018 1638   CREATININE 1.02 (H) 09/15/2018 0517   CREATININE 1.02 (H) 09/14/2018 2028   CREATININE 1.05 (H) 04/27/2018 1156   CREATININE 1.18 (H) 12/05/2015 1506   CREATININE 0.94 11/21/2014 1505   CREATININE 1.00 05/03/2014 1220   CALCIUM 8.7 (L) 09/15/2018 0517   CALCIUM 8.9 09/14/2018 2028   CALCIUM 9.2 04/27/2018 1156   GFRNONAA >60 09/15/2018 0517   GFRNONAA >60 09/14/2018 2028   GFRNONAA 63 04/27/2018 1156   GFRNONAA 75 11/21/2014 1505   GFRNONAA 69 05/03/2014 1220   GFRAA >60 09/15/2018 0517   GFRAA >60 09/14/2018 2028   GFRAA 73 04/27/2018 1156   GFRAA 86 11/21/2014 1505   GFRAA 80 05/03/2014 1220   CBC    Component Value Date/Time   WBC 8.2 09/14/2018 2028   RBC 5.13 (H) 09/14/2018 2028   HGB 13.4 09/14/2018 2028   HGB 14.7 03/29/2018 1540   HCT 43.3 09/14/2018 2028   HCT 45.6 03/29/2018 1540   PLT 198 09/14/2018 2028   PLT 164 03/29/2018 1540   MCV 84.4 09/14/2018 2028   MCV 81 03/29/2018 1540   MCH 26.1 09/14/2018 2028   MCHC 30.9 09/14/2018 2028   RDW 15.3 09/14/2018 2028  RDW 14.6 03/29/2018 1540   LYMPHSABS 3.2 09/14/2018 2028   LYMPHSABS 2.1 03/29/2018 1540   MONOABS 0.5 09/14/2018 2028   EOSABS 0.2 09/14/2018 2028   EOSABS 0.6 (H) 03/29/2018 1540   BASOSABS 0.0 09/14/2018 2028   BASOSABS 0.1 03/29/2018 1540   HEPATIC Function Panel Recent Labs    03/29/18 1540 04/09/18 1058 04/27/18 1156  PROT 7.6 7.0 6.5   HEMOGLOBIN A1C No components found for: HGA1C,  MPG CARDIAC ENZYMES Lab Results  Component Value Date   CKTOTAL 178 (H) 03/08/2012   CKMB 1.3 03/08/2012   TROPONINI <0.03 09/15/2018   TROPONINI <0.03 09/14/2018   TROPONINI <0.03 09/14/2018   BNP No results for input(s): PROBNP in the last 8760 hours. TSH Recent Labs    01/27/18 1638 04/09/18 1552  TSH 1.980 2.345    CHOLESTEROL No results for input(s): CHOL in the last 8760 hours.  Scheduled Meds: . aspirin EC  81 mg Oral Daily  . furosemide  40 mg Intravenous Q12H  . heparin  5,000 Units Subcutaneous Q8H  . sodium chloride flush  3 mL Intravenous Q12H   Continuous Infusions: . sodium chloride Stopped (09/15/18 0408)  . nitroGLYCERIN Stopped (09/15/18 0100)   PRN Meds:.sodium chloride, acetaminophen, ondansetron (ZOFRAN) IV, sodium chloride flush  Assessment/Plan: Acute systolic heart failure, HFrEF Acute respiratory failure with hypoxemia, improving Dilated and nonischemic cardiomyopathy Hypertension Hyperlipidemia  Continue diuresis and supplemental oxygen.   LOS: 1 day    Orpah Cobb  MD  09/15/2018, 9:44 AM

## 2018-09-15 NOTE — Progress Notes (Signed)
RT placed pt on CPAP for the night on her home setting of 6 cmH2O. Pt on medium nasal mask and tolerating well. RT will continue to monitor.

## 2018-09-15 NOTE — Progress Notes (Signed)
  Echocardiogram 2D Echocardiogram has been performed.  Sabrina Ortiz 09/15/2018, 8:22 AM

## 2018-09-16 LAB — BASIC METABOLIC PANEL
Anion gap: 14 (ref 5–15)
BUN: 12 mg/dL (ref 6–20)
CO2: 17 mmol/L — ABNORMAL LOW (ref 22–32)
Calcium: 9.1 mg/dL (ref 8.9–10.3)
Chloride: 105 mmol/L (ref 98–111)
Creatinine, Ser: 1.02 mg/dL — ABNORMAL HIGH (ref 0.44–1.00)
GFR calc Af Amer: >60 mL/min (ref >60)
GFR calc non Af Amer: >60 mL/min (ref >60)
Glucose, Bld: 136 mg/dL — ABNORMAL HIGH (ref 70–99)
Potassium: 4.5 mmol/L (ref 3.5–5.1)
SODIUM: 136 mmol/L (ref 135–145)

## 2018-09-16 LAB — GLUCOSE, CAPILLARY: GLUCOSE-CAPILLARY: 119 mg/dL — AB (ref 70–99)

## 2018-09-16 NOTE — Progress Notes (Signed)
Ref: Sabrina Ortiz, Cammie, MD   Subjective:  Feeling better. Off supplemental oxygen. Chest x-ray shows worsening of CHF. Patient has lost 8 pounds of weight in 2-3 days. Uses CPAP at home overnight.  Objective:  Vital Signs in the last 24 hours: Temp:  [97.9 F (36.6 C)-98.2 F (36.8 C)] 97.9 F (36.6 C) (01/10 0835) Pulse Rate:  [45-124] 50 (01/10 0919) Cardiac Rhythm: Sinus tachycardia (01/10 0800) Resp:  [19-31] 24 (01/10 0919) BP: (81-135)/(32-96) 105/74 (01/10 0919) SpO2:  [91 %-100 %] 91 % (01/10 0919) FiO2 (%):  [21 %] 21 % (01/09 2348) Weight:  [77.1 kg] 77.1 kg (01/10 0545)  Physical Exam: BP Readings from Last 1 Encounters:  09/16/18 105/74     Wt Readings from Last 1 Encounters:  09/16/18 77.1 kg    Weight change: -2.28 kg Body mass index is 28.29 kg/m. HEENT: [place]/AT, Eyes-Brown, PERL, EOMI, Conjunctiva-Pink, Sclera-Non-icteric Neck: + JVD, No bruit, Trachea midline. Lungs:  Clearing, Bilateral. Cardiac:  Regular rhythm, normal S1 and S2, no S3. II/VI systolic murmur. Abdomen:  Soft, non-tender. BS present. Extremities:  No edema present. No cyanosis. No clubbing. CNS: AxOx3, Cranial nerves grossly intact, moves all 4 extremities.  Skin: Warm and dry.   Intake/Output from previous day: 01/09 0701 - 01/10 0700 In: 1285.3 [P.O.:1258; I.V.:27.3] Out: 2900 [Urine:2900]    Lab Results: BMET    Component Value Date/Time   NA 136 09/16/2018 0554   NA 137 09/15/2018 0517   NA 139 09/14/2018 2028   NA 140 04/27/2018 1156   NA 140 03/29/2018 1540   NA 142 01/27/2018 1638   K 4.5 09/16/2018 0554   K 3.4 (L) 09/15/2018 0517   K 3.7 09/14/2018 2028   CL 105 09/16/2018 0554   CL 105 09/15/2018 0517   CL 110 09/14/2018 2028   CO2 17 (L) 09/16/2018 0554   CO2 24 09/15/2018 0517   CO2 19 (L) 09/14/2018 2028   GLUCOSE 136 (H) 09/16/2018 0554   GLUCOSE 136 (H) 09/15/2018 0517   GLUCOSE 145 (H) 09/14/2018 2028   BUN 12 09/16/2018 0554   BUN 9 09/15/2018 0517   BUN  12 09/14/2018 2028   BUN 11 04/27/2018 1156   BUN 13 03/29/2018 1540   BUN 13 01/27/2018 1638   CREATININE 1.02 (H) 09/16/2018 0554   CREATININE 1.02 (H) 09/15/2018 0517   CREATININE 1.02 (H) 09/14/2018 2028   CREATININE 1.18 (H) 12/05/2015 1506   CREATININE 0.94 11/21/2014 1505   CREATININE 1.00 05/03/2014 1220   CALCIUM 9.1 09/16/2018 0554   CALCIUM 8.7 (L) 09/15/2018 0517   CALCIUM 8.9 09/14/2018 2028   GFRNONAA >60 09/16/2018 0554   GFRNONAA >60 09/15/2018 0517   GFRNONAA >60 09/14/2018 2028   GFRNONAA 75 11/21/2014 1505   GFRNONAA 69 05/03/2014 1220   GFRAA >60 09/16/2018 0554   GFRAA >60 09/15/2018 0517   GFRAA >60 09/14/2018 2028   GFRAA 86 11/21/2014 1505   GFRAA 80 05/03/2014 1220   CBC    Component Value Date/Time   WBC 8.2 09/14/2018 2028   RBC 5.13 (H) 09/14/2018 2028   HGB 13.4 09/14/2018 2028   HGB 14.7 03/29/2018 1540   HCT 43.3 09/14/2018 2028   HCT 45.6 03/29/2018 1540   PLT 198 09/14/2018 2028   PLT 164 03/29/2018 1540   MCV 84.4 09/14/2018 2028   MCV 81 03/29/2018 1540   MCH 26.1 09/14/2018 2028   MCHC 30.9 09/14/2018 2028   RDW 15.3 09/14/2018 2028   RDW  14.6 03/29/2018 1540   LYMPHSABS 3.2 09/14/2018 2028   LYMPHSABS 2.1 03/29/2018 1540   MONOABS 0.5 09/14/2018 2028   EOSABS 0.2 09/14/2018 2028   EOSABS 0.6 (H) 03/29/2018 1540   BASOSABS 0.0 09/14/2018 2028   BASOSABS 0.1 03/29/2018 1540   HEPATIC Function Panel Recent Labs    03/29/18 1540 04/09/18 1058 04/27/18 1156  PROT 7.6 7.0 6.5   HEMOGLOBIN A1C No components found for: HGA1C,  MPG CARDIAC ENZYMES Lab Results  Component Value Date   CKTOTAL 178 (H) 03/08/2012   CKMB 1.3 03/08/2012   TROPONINI <0.03 09/15/2018   TROPONINI <0.03 09/15/2018   TROPONINI <0.03 09/14/2018   BNP No results for input(s): PROBNP in the last 8760 hours. TSH Recent Labs    01/27/18 1638 04/09/18 1552  TSH 1.980 2.345   CHOLESTEROL No results for input(s): CHOL in the last 8760  hours.  Scheduled Meds: . aspirin EC  81 mg Oral Daily  . furosemide  40 mg Intravenous Q12H  . heparin  5,000 Units Subcutaneous Q8H  . potassium chloride  10 mEq Oral TID  . sacubitril-valsartan  1 tablet Oral BID  . sodium chloride flush  3 mL Intravenous Q12H   Continuous Infusions: . sodium chloride Stopped (09/15/18 0408)  . nitroGLYCERIN Stopped (09/15/18 0100)   PRN Meds:.sodium chloride, acetaminophen, MUSCLE RUB, ondansetron (ZOFRAN) IV, sodium chloride flush  Assessment/Plan: Acute systolic left heart failure, HFrEF Acute respiratory failure with hypoxemia Dilated and non-ischemic cardiomyopathy Hypertension Hyperlipidemia  Transfer to Telemetry bed. Continue CPAP at night. Increase activity.   LOS: 2 days    Orpah Cobb  MD  09/16/2018, 10:10 AM

## 2018-09-17 LAB — BASIC METABOLIC PANEL
ANION GAP: 7 (ref 5–15)
BUN: 14 mg/dL (ref 6–20)
CO2: 25 mmol/L (ref 22–32)
Calcium: 9.2 mg/dL (ref 8.9–10.3)
Chloride: 104 mmol/L (ref 98–111)
Creatinine, Ser: 1.21 mg/dL — ABNORMAL HIGH (ref 0.44–1.00)
GFR calc Af Amer: >60 mL/min (ref >60)
GFR calc non Af Amer: 53 mL/min — ABNORMAL LOW (ref >60)
Glucose, Bld: 149 mg/dL — ABNORMAL HIGH (ref 70–99)
Potassium: 4.2 mmol/L (ref 3.5–5.1)
Sodium: 136 mmol/L (ref 135–145)

## 2018-09-17 LAB — LIPID PANEL
Cholesterol: 190 mg/dL (ref 0–200)
HDL: 48 mg/dL (ref >40)
LDL Cholesterol: 117 mg/dL — ABNORMAL HIGH (ref 0–99)
Total CHOL/HDL Ratio: 4 RATIO
Triglycerides: 123 mg/dL (ref <150)
VLDL: 25 mg/dL (ref 0–40)

## 2018-09-17 LAB — INFLUENZA PANEL BY PCR (TYPE A & B)
Influenza A By PCR: NEGATIVE
Influenza B By PCR: NEGATIVE

## 2018-09-17 MED ORDER — FUROSEMIDE 40 MG PO TABS
40.0000 mg | ORAL_TABLET | Freq: Every day | ORAL | Status: DC
  Administered 2018-09-17 – 2018-09-18 (×2): 40 mg via ORAL
  Filled 2018-09-17 (×2): qty 1

## 2018-09-17 MED ORDER — POTASSIUM CHLORIDE CRYS ER 10 MEQ PO TBCR
10.0000 meq | EXTENDED_RELEASE_TABLET | Freq: Every day | ORAL | Status: DC
  Administered 2018-09-18: 10 meq via ORAL
  Filled 2018-09-17: qty 1

## 2018-09-17 NOTE — Progress Notes (Signed)
Ref: Cain Saupe, MD   Subjective:  Feeling better. VS stable. Decreasing respiratory distress.  Objective:  Vital Signs in the last 24 hours: Temp:  [96.8 F (36 C)-99.3 F (37.4 C)] 98.6 F (37 C) (01/11 0829) Pulse Rate:  [45-108] 98 (01/11 0904) Cardiac Rhythm: Normal sinus rhythm (01/11 0700) Resp:  [16-19] 19 (01/11 0829) BP: (88-115)/(56-90) 106/69 (01/11 0904) SpO2:  [93 %-100 %] 100 % (01/11 0904) Weight:  [77.7 kg] 77.7 kg (01/11 0019)  Physical Exam: BP Readings from Last 1 Encounters:  09/17/18 106/69     Wt Readings from Last 1 Encounters:  09/17/18 77.7 kg    Weight change: 0.556 kg Body mass index is 28.49 kg/m. HEENT: [place]/AT, Eyes-Brown, PERL, EOMI, Conjunctiva-Pink, Sclera-Non-icteric Neck: + JVD, No bruit, Trachea midline. Lungs:  Clearing, Bilateral. Cardiac:  Regular rhythm, normal S1 and S2, no S3. II/VI systolic murmur. Abdomen:  Soft, non-tender. BS present. Extremities:  No edema present. No cyanosis. No clubbing. CNS: AxOx3, Cranial nerves grossly intact, moves all 4 extremities.  Skin: Warm and dry.   Intake/Output from previous day: 01/10 0701 - 01/11 0700 In: 1375 [P.O.:1372; I.V.:3] Out: 1050 [Urine:1050]    Lab Results: BMET    Component Value Date/Time   NA 136 09/17/2018 0415   NA 136 09/16/2018 0554   NA 137 09/15/2018 0517   NA 140 04/27/2018 1156   NA 140 03/29/2018 1540   NA 142 01/27/2018 1638   K 4.2 09/17/2018 0415   K 4.5 09/16/2018 0554   K 3.4 (L) 09/15/2018 0517   CL 104 09/17/2018 0415   CL 105 09/16/2018 0554   CL 105 09/15/2018 0517   CO2 25 09/17/2018 0415   CO2 17 (L) 09/16/2018 0554   CO2 24 09/15/2018 0517   GLUCOSE 149 (H) 09/17/2018 0415   GLUCOSE 136 (H) 09/16/2018 0554   GLUCOSE 136 (H) 09/15/2018 0517   BUN 14 09/17/2018 0415   BUN 12 09/16/2018 0554   BUN 9 09/15/2018 0517   BUN 11 04/27/2018 1156   BUN 13 03/29/2018 1540   BUN 13 01/27/2018 1638   CREATININE 1.21 (H) 09/17/2018 0415   CREATININE 1.02 (H) 09/16/2018 0554   CREATININE 1.02 (H) 09/15/2018 0517   CREATININE 1.18 (H) 12/05/2015 1506   CREATININE 0.94 11/21/2014 1505   CREATININE 1.00 05/03/2014 1220   CALCIUM 9.2 09/17/2018 0415   CALCIUM 9.1 09/16/2018 0554   CALCIUM 8.7 (L) 09/15/2018 0517   GFRNONAA 53 (L) 09/17/2018 0415   GFRNONAA >60 09/16/2018 0554   GFRNONAA >60 09/15/2018 0517   GFRNONAA 75 11/21/2014 1505   GFRNONAA 69 05/03/2014 1220   GFRAA >60 09/17/2018 0415   GFRAA >60 09/16/2018 0554   GFRAA >60 09/15/2018 0517   GFRAA 86 11/21/2014 1505   GFRAA 80 05/03/2014 1220   CBC    Component Value Date/Time   WBC 8.2 09/14/2018 2028   RBC 5.13 (H) 09/14/2018 2028   HGB 13.4 09/14/2018 2028   HGB 14.7 03/29/2018 1540   HCT 43.3 09/14/2018 2028   HCT 45.6 03/29/2018 1540   PLT 198 09/14/2018 2028   PLT 164 03/29/2018 1540   MCV 84.4 09/14/2018 2028   MCV 81 03/29/2018 1540   MCH 26.1 09/14/2018 2028   MCHC 30.9 09/14/2018 2028   RDW 15.3 09/14/2018 2028   RDW 14.6 03/29/2018 1540   LYMPHSABS 3.2 09/14/2018 2028   LYMPHSABS 2.1 03/29/2018 1540   MONOABS 0.5 09/14/2018 2028   EOSABS 0.2 09/14/2018 2028  EOSABS 0.6 (H) 03/29/2018 1540   BASOSABS 0.0 09/14/2018 2028   BASOSABS 0.1 03/29/2018 1540   HEPATIC Function Panel Recent Labs    03/29/18 1540 04/09/18 1058 04/27/18 1156  PROT 7.6 7.0 6.5   HEMOGLOBIN A1C No components found for: HGA1C,  MPG CARDIAC ENZYMES Lab Results  Component Value Date   CKTOTAL 178 (H) 03/08/2012   CKMB 1.3 03/08/2012   TROPONINI <0.03 09/15/2018   TROPONINI <0.03 09/15/2018   TROPONINI <0.03 09/14/2018   BNP No results for input(s): PROBNP in the last 8760 hours. TSH Recent Labs    01/27/18 1638 04/09/18 1552  TSH 1.980 2.345   CHOLESTEROL No results for input(s): CHOL in the last 8760 hours.  Scheduled Meds: . aspirin EC  81 mg Oral Daily  . furosemide  40 mg Oral Daily  . heparin  5,000 Units Subcutaneous Q8H  . [START ON  09/18/2018] potassium chloride  10 mEq Oral Daily  . sacubitril-valsartan  1 tablet Oral BID  . sodium chloride flush  3 mL Intravenous Q12H   Continuous Infusions: . sodium chloride Stopped (09/15/18 0408)  . nitroGLYCERIN Stopped (09/15/18 0100)   PRN Meds:.sodium chloride, acetaminophen, MUSCLE RUB, ondansetron (ZOFRAN) IV, sodium chloride flush  Assessment/Plan: Acute systolic left heart failure, HFrEF Acute respiratory failure with hypoxemia, improved Dilated and non-ischemic cardiomyopathy Hypertension Hyperlipidemia  Increase activity. Change IV lasix to PO. Discussed decreasing oral fluid intake by 50 % and daily weight.   LOS: 3 days    Orpah CobbAjay Tasman Zapata  MD  09/17/2018, 9:42 AM

## 2018-09-18 LAB — BASIC METABOLIC PANEL
Anion gap: 8 (ref 5–15)
BUN: 17 mg/dL (ref 6–20)
CO2: 24 mmol/L (ref 22–32)
Calcium: 9.1 mg/dL (ref 8.9–10.3)
Chloride: 104 mmol/L (ref 98–111)
Creatinine, Ser: 1.3 mg/dL — ABNORMAL HIGH (ref 0.44–1.00)
GFR calc Af Amer: 57 mL/min — ABNORMAL LOW (ref >60)
GFR calc non Af Amer: 49 mL/min — ABNORMAL LOW (ref >60)
Glucose, Bld: 149 mg/dL — ABNORMAL HIGH (ref 70–99)
POTASSIUM: 4.1 mmol/L (ref 3.5–5.1)
Sodium: 136 mmol/L (ref 135–145)

## 2018-09-18 MED ORDER — ASPIRIN 81 MG PO TBEC: 81.0000 mg | DELAYED_RELEASE_TABLET | Freq: Every day | ORAL | Status: AC

## 2018-09-18 NOTE — Progress Notes (Signed)
Patient discharging to home today. Alert and oriented and displays no s/sx of distress.  All d/c instructions reviewed with patient. Emphasis placed on med compliance and follow up care. All personal belongings with patient. Family to provided transportation.

## 2018-09-18 NOTE — Care Management (Signed)
Patient from home, independent. Has PCP and cardiologist. Patient will call to schedule follow ups. Coverage through Medicare. No other CM needs.

## 2018-09-18 NOTE — Discharge Summary (Signed)
Physician Discharge Summary  Patient ID: Sabrina Ortiz MRN: [id number] DOB/AGE: 12/07/1970 48 y.o.  Admit date: 09/14/2018 Discharge date: 09/18/2018  Admission Diagnoses: Acute systolic left heart failure, HFrEF Dilated and non-ischemic cardiomyopathy Hypertension Hyperlipidemia  Discharge Diagnoses:  Principle Problem: Acute on chronic left systolic heart failure (HCC) Active Problems:   Acute respiratory failure with hypoxemia   Acute kidney failure, worsened by diuretic use   Dilated cardiomyopathy   Non-ischemic cardiomyopathy   Hypertension   Hyperlipidemia  Discharged Condition: fair  Hospital Course: 48 year old female with PMH of dilated cardiomyopathy had 3 day history of shortness of breath post dietary non-compliance over the holidays. She had normal coronaries on cardiac cath 5 months ago.  She improved with IV lasix. She understood checking weight daily, salt and fluid restriction and taking medications regularly as she had stopped few medications due to cost. She will see me in 1 week and Primary care in 1 month.  Consults: cardiology  Significant Diagnostic Studies: labs: Mildly elevated blood sugars and creatinine of 1.02. BNP was 576.4 pg. CBC was near normal.  EKG: Sinus tachycardia, Biatrial enlargement and infero-lateral ischemia.  Chest X-ray: CHF with bilateral pulmonary edema.  Echocardiogram: Severely dilated EF 25 % with mil;d MR.  Treatments: cardiac meds: carvedilol, Aspirin, Atorvastatin, Entrest, furosemide and potassium.  Discharge Exam: Blood pressure 101/76, pulse 93, temperature 98.9 F (37.2 C), temperature source Oral, resp. rate 18, height 5\' 5"  (1.651 m), weight 78 kg, SpO2 97 %. General appearance: alert, cooperative and appears stated age. Head: Normocephalic, atraumatic. Eyes: Brown eyes, pink conjunctiva, corneas clear. PERRL, EOM's intact.  Neck: No adenopathy, no carotid bruit, + JVD, supple, symmetrical, trachea midline and  thyroid not enlarged. Resp: Clear to auscultation bilaterally. Cardio: Regular rate and rhythm, S1, S2 normal, II/VI systolic murmur, no click, rub or gallop. GI: Soft, non-tender; bowel sounds normal; no organomegaly. Extremities: No edema, cyanosis or clubbing. Skin: Warm and dry.  Neurologic: Alert and oriented X 3, normal strength and tone. Normal coordination and gait.  Disposition: Discharge disposition: 01-Home or Self Care        Allergies as of 09/18/2018      Reactions   Shellfish Allergy Anaphylaxis      Medication List    STOP taking these medications   megestrol 40 MG tablet Commonly known as:  MEGACE     TAKE these medications   aspirin 81 MG EC tablet Take 1 tablet (81 mg total) by mouth daily. Start taking on:  September 19, 2018   atorvastatin 40 MG tablet Commonly known as:  LIPITOR Take 1 tablet (40 mg total) by mouth daily at 6 PM.   carvedilol 6.25 MG tablet Commonly known as:  COREG Take 1 tablet (6.25 mg total) by mouth 2 (two) times daily with a meal.   furosemide 20 MG tablet Commonly known as:  LASIX Take 1 tablet (20 mg total) by mouth daily.   magnesium oxide 400 (241.3 Mg) MG tablet Commonly known as:  MAG-OX Take 1 tablet (400 mg total) by mouth every Monday, Wednesday, and Friday.   medroxyPROGESTERone 150 MG/ML injection Commonly known as:  DEPO-PROVERA Inject 1 mL (150 mg total) into the muscle every 3 (three) months.   Melatonin 3 MG Caps Take 1 capsule (3 mg total) by mouth at bedtime as needed. What changed:  reasons to take this   omeprazole 20 MG capsule Commonly known as:  PRILOSEC Take 1 capsule (20 mg total) by mouth daily.  potassium chloride 10 MEQ tablet Commonly known as:  K-DUR,KLOR-CON Take 1 tablet (10 mEq total) by mouth daily.   sacubitril-valsartan 24-26 MG Commonly known as:  ENTRESTO Take 1 tablet by mouth 2 (two) times daily.   Vitamin D (Ergocalciferol) 1.25 MG (50000 UT) Caps capsule Commonly  known as:  DRISDOL Take 1 capsule (50,000 Units total) by mouth every 7 (seven) days.      Follow-up Information    Orpah Cobb, MD. Call in 1 week(s).   Specialty:  Cardiology Contact information: [address] [place] [address] [phone]        Cain Saupe, MD. Schedule an appointment as soon as possible for a visit in 1 month(s).   Specialty:  Family Medicine Contact information: [address] [place] [address] [phone]           Signed: Ricki Rodriguez 09/18/2018, 10:03 AM

## 2018-09-19 ENCOUNTER — Other Ambulatory Visit: Payer: Self-pay | Admitting: Family Medicine

## 2018-09-19 ENCOUNTER — Telehealth: Payer: Self-pay

## 2018-09-19 ENCOUNTER — Telehealth: Payer: Self-pay | Admitting: Family Medicine

## 2018-09-19 DIAGNOSIS — I5022 Chronic systolic (congestive) heart failure: Secondary | ICD-10-CM

## 2018-09-19 MED ORDER — MAGNESIUM OXIDE 400 (241.3 MG) MG PO TABS: 400.0000 mg | ORAL_TABLET | ORAL | 3 refills | Status: DC

## 2018-09-19 MED ORDER — FUROSEMIDE 20 MG PO TABS: 20.0000 mg | ORAL_TABLET | Freq: Every day | ORAL | 3 refills | Status: DC

## 2018-09-19 MED ORDER — CARVEDILOL 6.25 MG PO TABS: 6.2500 mg | ORAL_TABLET | Freq: Two times a day (BID) | ORAL | 3 refills | Status: DC

## 2018-09-19 MED ORDER — SACUBITRIL-VALSARTAN 24-26 MG PO TABS: 1.0000 | ORAL_TABLET | Freq: Two times a day (BID) | ORAL | 11 refills | Status: DC

## 2018-09-19 MED ORDER — POTASSIUM CHLORIDE CRYS ER 10 MEQ PO TBCR: 10.0000 meq | EXTENDED_RELEASE_TABLET | Freq: Every day | ORAL | 3 refills | Status: DC

## 2018-09-19 MED FILL — ATORVASTATIN CALCIUM 40 MG: 40 | 30 days supply | Qty: 30 | Fill #2

## 2018-09-19 MED FILL — OMEPRAZOLE 20 MG CAP: 20 | 30 days supply | Qty: 30 | Fill #1

## 2018-09-19 MED FILL — ENTRESTO 24 MG-26 MG TABLET: 24-26 | 14 days supply | Qty: 28 | Fill #0

## 2018-09-19 MED FILL — CARVEDILOL 6.25 MG TABLET: 6.25 | 30 days supply | Qty: 60 | Fill #3

## 2018-09-19 NOTE — Telephone Encounter (Signed)
From discharge call, requesting refills for medications:    She said that she was informed by [facility] pharmacy that in order to qualify for financial assistance with entresto, the medication needs to be prescribed by a [facility] provider.  She said that the pharmacy gave her samples for 28 days.     She then explained that she needs refills for potassium, furosemide and magnesium oxide.  She said that the pharmacy told her that she could not refill the medications even though she has 3 refills.  She said that she has completely run out of lasix and did not have any today.     She said that she receive aspirin samples from D Algie Coffer and she has stopped taking the megace. Informed her that her PCP would be notified of her need for medications.

## 2018-09-19 NOTE — Telephone Encounter (Signed)
Pt came in requesting Dr. Jillyn Hidden to prescribe -sacubitril-valsartan (ENTRESTO) 24-26 MG  Her cardiologist originally prescribed it, but in order for the pharmacy to give it at a discounted rate it will need to be resent from one of our doctors, please follow up as soon as possible

## 2018-09-19 NOTE — Telephone Encounter (Signed)
Please send a script to [facility] if request is appropriate.

## 2018-09-19 NOTE — Progress Notes (Signed)
Patient ID: Sabrina Ortiz, female   DOB: [birth year], 48 y.o.   MRN: [id number]   Patient called the office due to the need for medication refills/prescriptions status post recent hospital discharge.  Patient reports the need for refills of carvedilol, furosemide, magnesium oxide, potassium chloride and Entresto.  Prescriptions for these medications will be sent to this pharmacy.

## 2018-09-19 NOTE — Telephone Encounter (Signed)
Please let patient know that refills were sent to this pharmacy

## 2018-09-19 NOTE — Telephone Encounter (Signed)
Transition Care Management Follow-up Telephone Call  Date of discharge and from where: 09/18/2018, [facility]  How have you been since you were released from the hospital? She stated that she is " doing great."   Any questions or concerns? Please see below regarding medications.  She said that she she went to medicare to inquire about a drug plan as she only has medicare A and B, no drug coverage. She said that medicare instructed her to go to DSS because she would qualify for medicaid. She said that she is waiting for a medicaid hearing. Explained to her that [facility] offers the services of [employer] [place] if she needs guidance with her medicaid application.   Items Reviewed:  Did the pt receive and understand the discharge instructions provided? Yes. She said that she has the discharge instructions and has no questions  Medications obtained and verified? She said that she reviewed the medication list and did not need to review it with this CM.  She said that she was informed by [facility] pharmacy that in order to qualify for financial assistance with entresto, the medication needs to be prescribed by a [facility] provider.  She said that the pharmacy gave her samples for 28 days.   She then explained that she needs refills for potassium, furosemide and magnesium oxide.  She said that the pharmacy told her that she could not refill the medications even though she has 3 refills.  She said that she has completely run out of lasix and did not have any today.   She said that she receive aspirin samples from D Algie Coffer and she has stopped taking the megace. Informed her that her PCP would be notified of her need for medications.   Any new allergies since your discharge? None reported  Dietary orders reviewed? Yes. She said that she was instructed to limit fluid intake to 8 -8oz drinks/day. Discussed the need to monitor all fluids including ice. She said that she is aware and purchased a " measure utensil"  to help track her intake.  She said that she is trying to limit her sodium intake and is using Mrs Sharilyn Sites.  Discussed the need to watch for sodium in her food, not just adding salt,   Do you have support at home? Yes, her niece and sister  Other (ie: DME, Home Health, etc) scale - she said that she needs to "calibrate" it. Weight this morning was 173.8 lbs.  Instructed her to keep log of weights, weigh first thing in the morning. Also discussed when to report weight gain to provider.   Independent and D = Dependent)       ADL's:independent   Follow up appointments reviewed:    PCP Hospital f/u appt confirmed? 09/30/2018 @ 1530 with Dr [facility] f/u appt confirmed? She needs to schedule appointment with Dr Algie Coffer.  Are transportation arrangements needed? no  If their condition worsens, is the pt aware to call  their PCP or go to the ED? yes  Was the patient provided with contact information for the PCP's office or ED? She has the phone number.  She also has Dr SJGGEZM'O phone #.   Was the pt encouraged to call back with questions or concerns?yes

## 2018-09-20 ENCOUNTER — Telehealth: Payer: Self-pay

## 2018-09-20 MED FILL — MAGNESIUM OXIDE 400 MG TAB: 400 | 30 days supply | Qty: 15 | Fill #0

## 2018-09-20 MED FILL — FUROSEMIDE 20 MG TABLET: 20 | 30 days supply | Qty: 30 | Fill #0

## 2018-09-20 MED FILL — POTASSIUM CHLORIDE ER 10 ME: 10 | 30 days supply | Qty: 30 | Fill #0

## 2018-09-20 NOTE — Telephone Encounter (Signed)
Addressed.

## 2018-09-20 NOTE — Telephone Encounter (Signed)
Call placed to the patient and informed her that Dr Jillyn HiddenFulp sent refills for the entresto, lasix, potassium chloride and magensium oxide to [facility].  She was very appreciative and said she would call the pharmacy to see when they are ready for pick up.

## 2018-09-21 NOTE — Telephone Encounter (Signed)
Note reviewed. I have sent RX's to the pharmacy for the medications mentioned in the note as well as for [facility] with a note to the pharmacy that patient needs the PASS program.

## 2018-09-25 NOTE — ED Provider Notes (Signed)
[facility] 3E CHF Provider Note   CSN: [id number] Arrival date & time: 09/14/18  2015     History   Chief Complaint Chief Complaint  Patient presents with  . Respiratory Distress    HPI Sabrina Ortiz is a 48 y.o. female.  HPI   47yF with dyspnea. Hx of dilated cardiomyopathy. Worsening dyspnea over the past 2-3 days but then significantly worse in the last several hours. She reports compliance with meds but not diet recently. On EMS arrival she was tripoding. o2 saturations in 80s on RA. HR 130. Placed on CPAP. Received SL nitro x1. Arrived to ED still in significant distress. Denies pain. No fever or chills. Occasional cough.   Past Medical History:  Diagnosis Date  . Angio-edema   . Anxiety   . Chronic back pain   . COPD (chronic obstructive pulmonary disease) (HCC)   . Depression   . Hypertension   . Mental disorder   . PTSD (post-traumatic stress disorder)   . Urticaria     Patient Active Problem List   Diagnosis Date Noted  . Acute on chronic left systolic heart failure (HCC) 09/14/2018  . Prediabetes 04/27/2018  . Acute pulmonary edema (HCC) 04/09/2018  . Systolic heart failure (HCC) 04/09/2018  . Hypertensive emergency   . Allergic reaction 03/29/2018  . Angioedema 03/29/2018  . Recurrent urticaria 03/29/2018  . Rhinitis 03/29/2018  . Vitamin D deficiency 02/22/2018  . Anxiety 03/31/2017  . Trichomonas infection 07/17/2016  . Herpes genitalis in women 07/17/2016  . Essential hypertension 06/15/2013  . Dyslipidemia 06/15/2013  . Neuropathy 06/15/2013  . Generalized anxiety disorder 03/10/2012  . Major depressive disorder, recurrent, 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HCC) 03/08/2012    Past Surgical History:  Procedure Laterality Date  . CYST REMOVAL HAND    . RIGHT/LEFT HEART CATH AND CORONARY ANGIOGRAPHY N/A 04/12/2018   Procedure: RIGHT/LEFT HEART CATH AND CORONARY ANGIOGRAPHY;  Surgeon: Orpah Cobb, MD;  Location: [facility] INVASIVE CV  LAB;  Service: Cardiovascular;  Laterality: N/A;     OB History    Gravida  3   Para  1   Term      Preterm      AB  2   Living  1     SAB      TAB  2   Ectopic      Multiple      Live Births               Home Medications    Prior to Admission medications   Medication Sig Start Date End Date Taking? Authorizing Provider  atorvastatin (LIPITOR) 40 MG tablet Take 1 tablet (40 mg total) by mouth daily at 6 PM. 04/14/18  Yes Orpah Cobb, MD  medroxyPROGESTERone (DEPO-PROVERA) 150 MG/ML injection Inject 1 mL (150 mg total) into the muscle every 3 (three) months. 05/17/18  Yes Constant, Peggy, MD  Melatonin 3 MG CAPS Take 1 capsule (3 mg total) by mouth at bedtime as needed. Patient taking differently: Take 3 mg by mouth at bedtime as needed (sleep).  04/27/18  Yes Fulp, Cammie, MD  omeprazole (PRILOSEC) 20 MG capsule Take 1 capsule (20 mg total) by mouth daily. 04/27/18  Yes Fulp, Cammie, MD  Vitamin D, Ergocalciferol, (DRISDOL) 50000 units CAPS capsule Take 1 capsule (50,000 Units total) by mouth every 7 (seven) days. 07/08/18  Yes Fulp, Cammie, MD  aspirin EC 81 MG EC tablet Take 1 tablet (81 mg total) by mouth daily.  09/19/18   Orpah CobbKadakia, Ajay, MD  carvedilol (COREG) 6.25 MG tablet Take 1 tablet (6.25 mg total) by mouth 2 (two) times daily with a meal. 09/19/18   Fulp, Cammie, MD  furosemide (LASIX) 20 MG tablet Take 1 tablet (20 mg total) by mouth daily. 09/19/18   Fulp, Hewitt Shortsammie, MD  magnesium oxide (MAG-OX) 400 (241.3 Mg) MG tablet Take 1 tablet (400 mg total) by mouth every Monday, Wednesday, and Friday. 09/19/18   Fulp, Cammie, MD  potassium chloride (K-DUR,KLOR-CON) 10 MEQ tablet Take 1 tablet (10 mEq total) by mouth daily. 09/19/18   Fulp, Cammie, MD  sacubitril-valsartan (ENTRESTO) 24-26 MG Take 1 tablet by mouth 2 (two) times daily. To treat heart failure 09/19/18   Cain SaupeFulp, Cammie, MD    Family History Family History  Problem Relation Age of Onset  . Breast cancer Mother      Social History Social History   Tobacco Use  . Smoking status: Never Smoker  . Smokeless tobacco: Never Used  Substance Use Topics  . Alcohol use: No  . Drug use: No     Allergies   Shellfish allergy   Review of Systems Review of Systems  All systems reviewed and negative, other than as noted in HPI.  Physical Exam Updated Vital Signs BP 104/71 (BP Location: Right Arm)   Pulse (!) 114   Temp 98.3 F (36.8 C) (Oral)   Resp 18   Ht 5\' 5"  (1.651 m)   Wt 78 kg Comment: scale c  SpO2 99%   BMI 28.61 kg/m   Physical Exam Vitals signs and nursing note reviewed.  Constitutional:      General: She is in acute distress.     Appearance: She is well-developed.  HENT:     Head: Normocephalic and atraumatic.  Eyes:     General:        Right eye: No discharge.        Left eye: No discharge.     Conjunctiva/sclera: Conjunctivae normal.  Neck:     Musculoskeletal: Neck supple.  Cardiovascular:     Rate and Rhythm: Regular rhythm. Tachycardia present.     Heart sounds: Normal heart sounds. No murmur. No friction rub. No gallop.   Pulmonary:     Effort: Respiratory distress present.     Comments: Crackles b/l bases.  Abdominal:     General: There is no distension.     Palpations: Abdomen is soft.     Tenderness: There is no abdominal tenderness.  Musculoskeletal:        General: No tenderness.     Comments: Symmetric LE edema  Skin:    General: Skin is warm and dry.  Neurological:     Mental Status: She is alert.  Psychiatric:        Behavior: Behavior normal.        Thought Content: Thought content normal.      ED Treatments / Results  Labs (all labs ordered are listed, but only abnormal results are displayed) Labs Reviewed  BASIC METABOLIC PANEL - Abnormal; Notable for the following components:      Result Value   CO2 19 (*)    Glucose, Bld 145 (*)    Creatinine, Ser 1.02 (*)    All other components within normal limits  BRAIN NATRIURETIC  PEPTIDE - Abnormal; Notable for the following components:   B Natriuretic Peptide 576.4 (*)    All other components within normal limits  CBC WITH DIFFERENTIAL/PLATELET - Abnormal; Notable  for the following components:   RBC 5.13 (*)    All other components within normal limits  BASIC METABOLIC PANEL - Abnormal; Notable for the following components:   Potassium 3.4 (*)    Glucose, Bld 136 (*)    Creatinine, Ser 1.02 (*)    Calcium 8.7 (*)    All other components within normal limits  BASIC METABOLIC PANEL - Abnormal; Notable for the following components:   CO2 17 (*)    Glucose, Bld 136 (*)    Creatinine, Ser 1.02 (*)    All other components within normal limits  BASIC METABOLIC PANEL - Abnormal; Notable for the following components:   Glucose, Bld 149 (*)    Creatinine, Ser 1.21 (*)    GFR calc non Af Amer 53 (*)    All other components within normal limits  GLUCOSE, CAPILLARY - Abnormal; Notable for the following components:   Glucose-Capillary 119 (*)    All other components within normal limits  LIPID PANEL - Abnormal; Notable for the following components:   LDL Cholesterol 117 (*)    All other components within normal limits  BASIC METABOLIC PANEL - Abnormal; Notable for the following components:   Glucose, Bld 149 (*)    Creatinine, Ser 1.30 (*)    GFR calc non Af Amer 49 (*)    GFR calc Af Amer 57 (*)    All other components within normal limits  MRSA PCR SCREENING  TROPONIN I  TROPONIN I  TROPONIN I  TROPONIN I  INFLUENZA PANEL BY PCR (TYPE A & B)    EKG EKG Interpretation  Date/Time:  Wednesday September 14 2018 21:10:11 EST Ventricular Rate:  116 PR Interval:    QRS Duration: 110 QT Interval:  350 QTC Calculation: 487 R Axis:   100 Text Interpretation:  Sinus tachycardia Biatrial enlargement Right axis deviation Consider left ventricular hypertrophy Borderline prolonged QT interval similar to previous from 04/2018 aside from increased rate Confirmed by  Raeford RazorKohut, Karrina Lye [phone]) on 09/14/2018 9:26:52 PM Also confirmed by Raeford RazorKohut, Adaliah Hiegel [phone]([id number]), editor Barbette Hairassel, Kerry [phone])  on 09/15/2018 7:56:46 AM   Radiology No results found.   Dg Chest 2 View  Result Date: 09/15/2018 CLINICAL DATA:  CHF. EXAM: CHEST - 2 VIEW COMPARISON:  09/14/2018. FINDINGS: Cardiomegaly with pulmonary venous congestion and bilateral pulmonary infiltrates consistent pulmonary edema. Small bilateral pleural effusions. Findings have progressed from prior exam. No pneumothorax. IMPRESSION: Findings consistent with congestive heart failure and bilateral pulmonary edema. Bilateral pleural effusions. Findings have progressed from prior exam. Electronically Signed   By: Maisie Fushomas  Register   On: 09/15/2018 06:41   Dg Chest Portable 1 View  Result Date: 09/14/2018 CLINICAL DATA:  Dyspnea EXAM: PORTABLE CHEST 1 VIEW COMPARISON:  04/10/2018 FINDINGS: Cardiomegaly with central vascular congestion and mild diffuse interstitial and ground-glass opacities suspicious for edema. No large effusion. No pneumothorax. IMPRESSION: Cardiomegaly with vascular congestion and mild diffuse interstitial and ground-glass opacity suggesting mild pulmonary edema. Electronically Signed   By: Jasmine PangKim  Fujinaga M.D.   On: 09/14/2018 20:41    Procedures Procedures (including critical care time)  CRITICAL CARE Performed by: Raeford RazorStephen Anyssa Sharpless Total critical care time: 35 minutes Critical care time was exclusive of separately billable procedures and treating other patients. Critical care was necessary to treat or prevent imminent or life-threatening deterioration. Critical care was time spent personally by me on the following activities: development of treatment plan with patient and/or surrogate as well as nursing, discussions with consultants, evaluation of patient's response  to treatment, examination of patient, obtaining history from patient or surrogate, ordering and performing treatments and interventions, ordering and  review of laboratory studies, ordering and review of radiographic studies, pulse oximetry and re-evaluation of patient's condition.   Medications Ordered in ED Medications  furosemide (LASIX) injection 80 mg (80 mg Intravenous Given 09/14/18 2035)  potassium chloride SA (K-DUR,KLOR-CON) CR tablet 20 mEq (20 mEq Oral Given 09/15/18 2133)     Initial Impression / Assessment and Plan / ED Course  I have reviewed the triage vital signs and the nursing notes.  Pertinent labs & imaging results that were available during my care of the patient were reviewed by me and considered in my medical decision making (see chart for details).    47yF with acute on chronic HF. Switched to BiPAP. Lasix. Improving but will require admission for ongoing tx. Discussed with Dr Algie Coffer.   Final Clinical Impressions(s) / ED Diagnoses   Final diagnoses:  CHF (congestive heart failure) [facility])    ED Discharge Orders         Ordered    aspirin EC 81 MG EC tablet  Daily     09/18/18 0957           Raeford Razor, MD 09/25/18 [phone]

## 2018-09-27 DIAGNOSIS — I10 Essential (primary) hypertension: Secondary | ICD-10-CM | POA: Diagnosis not present

## 2018-09-27 DIAGNOSIS — I5022 Chronic systolic (congestive) heart failure: Secondary | ICD-10-CM | POA: Diagnosis not present

## 2018-09-27 DIAGNOSIS — I2510 Atherosclerotic heart disease of native coronary artery without angina pectoris: Secondary | ICD-10-CM | POA: Diagnosis not present

## 2018-09-30 ENCOUNTER — Encounter: Payer: Self-pay | Admitting: Family Medicine

## 2018-09-30 ENCOUNTER — Ambulatory Visit: Payer: Medicare Other | Attending: Family Medicine | Admitting: Family Medicine

## 2018-09-30 VITALS — BP 100/64 | HR 83 | Temp 98.9°F | Resp 18 | Ht 66.0 in | Wt 177.0 lb

## 2018-09-30 DIAGNOSIS — G4733 Obstructive sleep apnea (adult) (pediatric): Secondary | ICD-10-CM | POA: Diagnosis not present

## 2018-09-30 DIAGNOSIS — I420 Dilated cardiomyopathy: Secondary | ICD-10-CM | POA: Diagnosis not present

## 2018-09-30 DIAGNOSIS — F329 Major depressive disorder, single episode, unspecified: Secondary | ICD-10-CM | POA: Diagnosis not present

## 2018-09-30 DIAGNOSIS — I130 Hypertensive heart and chronic kidney disease with heart failure and stage 1 through stage 4 chronic kidney disease, or unspecified chronic kidney disease: Secondary | ICD-10-CM | POA: Insufficient documentation

## 2018-09-30 DIAGNOSIS — N183 Chronic kidney disease, stage 3 (moderate): Secondary | ICD-10-CM | POA: Diagnosis not present

## 2018-09-30 DIAGNOSIS — X500XXS Overexertion from strenuous movement or load, sequela: Secondary | ICD-10-CM | POA: Insufficient documentation

## 2018-09-30 DIAGNOSIS — M5442 Lumbago with sciatica, left side: Secondary | ICD-10-CM

## 2018-09-30 DIAGNOSIS — R5383 Other fatigue: Secondary | ICD-10-CM

## 2018-09-30 DIAGNOSIS — N1831 Chronic kidney disease, stage 3a: Secondary | ICD-10-CM

## 2018-09-30 DIAGNOSIS — Z09 Encounter for follow-up examination after completed treatment for conditions other than malignant neoplasm: Secondary | ICD-10-CM | POA: Diagnosis not present

## 2018-09-30 DIAGNOSIS — I340 Nonrheumatic mitral (valve) insufficiency: Secondary | ICD-10-CM | POA: Insufficient documentation

## 2018-09-30 DIAGNOSIS — G8929 Other chronic pain: Secondary | ICD-10-CM | POA: Diagnosis not present

## 2018-09-30 DIAGNOSIS — Z7982 Long term (current) use of aspirin: Secondary | ICD-10-CM | POA: Diagnosis not present

## 2018-09-30 DIAGNOSIS — Z9989 Dependence on other enabling machines and devices: Secondary | ICD-10-CM

## 2018-09-30 DIAGNOSIS — I10 Essential (primary) hypertension: Secondary | ICD-10-CM

## 2018-09-30 DIAGNOSIS — F4310 Post-traumatic stress disorder, unspecified: Secondary | ICD-10-CM | POA: Diagnosis not present

## 2018-09-30 DIAGNOSIS — J449 Chronic obstructive pulmonary disease, unspecified: Secondary | ICD-10-CM | POA: Insufficient documentation

## 2018-09-30 DIAGNOSIS — I5022 Chronic systolic (congestive) heart failure: Secondary | ICD-10-CM | POA: Diagnosis not present

## 2018-09-30 DIAGNOSIS — M25562 Pain in left knee: Secondary | ICD-10-CM | POA: Diagnosis not present

## 2018-09-30 DIAGNOSIS — E559 Vitamin D deficiency, unspecified: Secondary | ICD-10-CM | POA: Diagnosis not present

## 2018-09-30 NOTE — Progress Notes (Signed)
Subjective:    Patient ID: Sabrina Ortiz, female    DOB: 1970-11-06, 48 y.o.   MRN: [id number]  HPI       48 year old female with seen in follow-up of chronic medical issues which include chronic anxiety with depression, PTSD, chronic back pain, obstructive sleep apnea and hypertension as well as recent hospitalization.  Patient status post hospitalization from September 14, 2018 through September 18, 2018 after presenting to the emergency department with complaint of 3 days of worsening shortness of breath and patient had chest x-ray showing cardiomegaly with vascular congestion and mild pulmonary edema.  Patient was admitted with acute systolic heart failure, and dilated nonischemic cardiomyopathy for which she received IV diuresis.  She developed acute kidney failure which was worsened by diuretic use as well as acute respiratory failure with hypoxemia.  Patient's BNP on admission was 576.4.  She also had echocardiogram done which showed severely dilated ejection fraction of 25% with mild mitral regurgitation.       At today's visit, patient with complaint of fatigue.  She would like to know if she needs to be back on vitamin D as she states that when she was on vitamin D in the past that did seem to help improve her energy level.  Patient states that she is weighing herself daily.  Patient believes that she had increased salt intake over the holidays and poor food choices which led to her hospitalization.  Patient denies any current shortness of breath or peripheral edema related to her heart failure.  Patient does have ongoing follow-up with cardiology.  Patient reports that her blood pressures been controlled.  Patient denies any headache or dizziness related to her blood pressure.  Patient has been using her CPAP nightly and reports that it has improved her length of sleep as well as increased quality of sleep.  Patient has had no recent issues with morning headaches and patient feels more rested after  sleeping than she did in the past.      Patient reports continued chronic issues with low back pain as well as anxiety and depression and reports that she has been diagnosed as well with PTSD which all stemmed from a past work-related accident.  Patient states that she bent over at work 1 day to try and lift a box and patient states that she had onset of intense back pain and patient was later told by her doctor that she had ripped muscles away from her spine when she attempted to pick up the box.  Patient states that she has had chronic low back pain since that time.  Patient was unable to return to work and was placed on disability.  Patient reports that she continues to suffer from anxiety, depression and PTSD for which she is on medication.  Patient denies any suicidal thoughts or ideations but patient often feels sad and anxious.  Past Medical History:  Diagnosis Date  . Angio-edema   . Anxiety   . Chronic back pain   . COPD (chronic obstructive pulmonary disease) ([place])   . Depression   . Hypertension   . Mental disorder   . PTSD (post-traumatic stress disorder)   . Urticaria    Past Surgical History:  Procedure Laterality Date  . CYST REMOVAL HAND    . RIGHT/LEFT HEART CATH AND CORONARY ANGIOGRAPHY N/A 04/12/2018   Procedure: RIGHT/LEFT HEART CATH AND CORONARY ANGIOGRAPHY;  Surgeon: Dixie Dials, MD;  Location: [facility];  Service: Cardiovascular;  Laterality:  N/A;   Family History  Problem Relation Age of Onset  . Breast cancer Mother    Allergies  Allergen Reactions  . Shellfish Allergy Anaphylaxis         Review of Systems  Constitutional: Positive for fatigue. Negative for chills, diaphoresis and fever.  HENT: Negative for hearing loss and trouble swallowing.   Eyes: Negative for photophobia and visual disturbance.  Respiratory: Negative for cough. Shortness of breath: improved; mild SOB with exertion.   Cardiovascular: Negative for chest pain and leg swelling.    Gastrointestinal: Negative for abdominal pain, blood in stool, constipation, diarrhea and nausea.  Genitourinary: Negative for dysuria and flank pain.  Musculoskeletal: Positive for arthralgias, back pain, gait problem and myalgias. Negative for joint swelling.  Neurological: Negative for dizziness and headaches.  Hematological: Negative for adenopathy. Does not bruise/bleed easily.  Psychiatric/Behavioral: Positive for sleep disturbance (improved with use of CPAP). Negative for self-injury and suicidal ideas. The patient is nervous/anxious.        Objective:   Physical Exam BP 100/64 (BP Location: Left Arm, Patient Position: Sitting, Cuff Size: Large)   Pulse 83   Temp 98.9 F (37.2 C) (Oral)   Resp 18   Ht '5\' 6"'  (1.676 m)   Wt 177 lb (80.3 kg)   LMP 09/07/2018   SpO2 99%   BMI 28.57 kg/m Nurse's notes and vital signs reviewed General-well-nourished, well-developed female in no acute distress but patient looks fatigued versus having a flattened affect ENT- TMs gray, nares with mild edema with pale boggy nasal turbinates, patient with mild posterior pharynx erythema and patient with a narrowed posterior airway and large tongue base Neck-supple, no lymphadenopathy, no JVD Lungs-clear to auscultation bilaterally, no increased work of breathing, no rales Cardiovascular- regular rate and regular rhythm Abdomen-mild obesity, soft and nontender Back-no CVA tenderness, patient with thoracolumbar paraspinous spasm mild to moderate right greater than left and patient with  mid and lower right back pain to palpation and some midline lumbosacral discomfort to palpation Extremities-no edema Psych- patient interacts appropriately but appears to have a flattened affect      Assessment & Plan:  1. Chronic systolic heart failure (HCC) Patient status post recent hospitalization for acute on chronic left systolic heart failure.  Patient with echocardiogram during hospitalization showing severely  dilated cardiomyopathy with an ejection fraction of 25% with mild mitral regurg.  Patient reports that she is weighing herself daily and has had no significant increase in shortness of breath which is now improved status post hospitalization.  Patient has had no peripheral edema.  Patient reports compliance with her carvedilol, aspirin, atorvastatin, Entresto, furosemide and potassium.  Patient will have BMP and CBC done in follow-up of her heart failure as well as use of medications for treatment - Basic Metabolic Panel - CBC with Differential  2. Essential hypertension Patient is now on several medications for treatment of heart failure which also treat/lower her blood pressure.  Patient denies dizziness or lightheadedness and blood pressure is well controlled at today's visit.  Patient's blood pressure goal is 130/80 or less which was discussed with the patient.  As patient with low blood pressure today's visit, she is asked to make sure to notify her cardiologist or myself if she feels as if she is having lightheadedness, dizziness or presyncopal symptoms as she may need to have an adjustment of her blood pressure medications as her blood pressure is running low normal.  3. OSA on CPAP Patient has obtained her CPAP and  reports nightly compliance with improvement in quality and quantity of sleep with decrease in daytime fatigue and morning headaches and now infrequent.  4. CKD stage G3a/A1, GFR 45-59 and albumin creatinine ratio <30 mg/g (HCC) Patient with stage III chronic kidney disease and did have some worsening of renal function during her hospitalization likely due to the use of diuretic medication for treatment of her heart failure.  On the day of patient's hospital discharge, September 18, 2018 patient with creatinine of 1.30 with EGFR 57.  Patient previously with EGFR of 1.02 on day of admission.  Patient will have recheck of BMP and CBC in follow-up of her chronic kidney disease. - Basic  Metabolic Panel - CBC with Differential  5. Encounter for examination following treatment at hospital Patient seen in follow-up of recent hospital admission for treatment of acute on chronic heart failure, dilated cardiomyopathy, hypertension and patient during hospitalization had hypokalemia and hypomagnesemia.  Patient will have labs done in follow-up.  Patient also has ongoing follow-up with her cardiologist.  Patient is aware of CHF signs and symptoms and patient is weighing herself daily.  6. Hypomagnesemia Patient with history of hypomagnesemia and patient will have magnesium level done at today's visit.  Patient will be notified if any additional magnesium supplementation is warranted - Magnesium  7. Vitamin D deficiency Patient with prior vitamin D deficiency and patient reports that she is having fatigue and would like to have recheck of her vitamin D level to see if she requires additional vitamin D supplementation.  Patient will be notified of the results of vitamin D level done at today's visit and if further prescription therapy is needed - Vitamin D, 25-hydroxy  8. Chronic midline low back pain with left-sided sciatica Patient reports chronic midline low back pain since her past work-related accident.  Patient at this time reports no radicular symptoms but patient has had off-and-on radicular symptoms with minor to no preceding incidents since her initial work injury.  Patient also reports chronic anxiety, depression and PTSD related to her accident.  Patient should continue precautions of not lifting more than 10 pounds and to make sure that she is lifting with her knees and not her back however patient has now developed some issues with left knee pain.  Patient may continue over-the-counter medication use but should avoid nonsteroidal anti-inflammatories due to her chronic kidney disease and now with daily aspirin therapy.  Continue use of omeprazole due to history of acid reflux/GERD  and current daily aspirin therapy.  Patient is advised to continue follow-up with [facility] mental health regarding her anxiety/depression and PTSD.  An After Visit Summary was printed and given to the patient.  Allergies as of 09/30/2018      Reactions   Shellfish Allergy Anaphylaxis      Medication List       Accurate as of September 30, 2018 11:59 PM. Always use your most recent med list.        aspirin 81 MG EC tablet Take 1 tablet (81 mg total) by mouth daily.   atorvastatin 40 MG tablet Commonly known as:  LIPITOR Take 1 tablet (40 mg total) by mouth daily at 6 PM.   carvedilol 6.25 MG tablet Commonly known as:  COREG Take 1 tablet (6.25 mg total) by mouth 2 (two) times daily with a meal.   furosemide 20 MG tablet Commonly known as:  LASIX Take 1 tablet (20 mg total) by mouth daily.   magnesium oxide 400 (241.3 Mg) MG  tablet Commonly known as:  MAG-OX Take 1 tablet (400 mg total) by mouth every Monday, Wednesday, and Friday.   medroxyPROGESTERone 150 MG/ML injection Commonly known as:  DEPO-PROVERA Inject 1 mL (150 mg total) into the muscle every 3 (three) months.   Melatonin 3 MG Caps Take 1 capsule (3 mg total) by mouth at bedtime as needed.   omeprazole 20 MG capsule Commonly known as:  PRILOSEC Take 1 capsule (20 mg total) by mouth daily.   potassium chloride 10 MEQ tablet Commonly known as:  K-DUR,KLOR-CON Take 1 tablet (10 mEq total) by mouth daily.   sacubitril-valsartan 24-26 MG Commonly known as:  ENTRESTO Take 1 tablet by mouth 2 (two) times daily. To treat heart failure   Vitamin D (Ergocalciferol) 1.25 MG (50000 UT) Caps capsule Commonly known as:  DRISDOL Take 1 capsule (50,000 Units total) by mouth every 7 (seven) days.      Return in about 6 weeks (around 11/11/2018) for kidney function.

## 2018-09-30 NOTE — Progress Notes (Signed)
Patient request a refill on vitamin d

## 2018-10-01 ENCOUNTER — Other Ambulatory Visit: Payer: Self-pay | Admitting: Family Medicine

## 2018-10-01 DIAGNOSIS — E559 Vitamin D deficiency, unspecified: Secondary | ICD-10-CM

## 2018-10-01 LAB — CBC WITH DIFFERENTIAL/PLATELET
Basophils Absolute: 0 x10E3/uL (ref 0.0–0.2)
Basos: 0 %
EOS (ABSOLUTE): 0.1 x10E3/uL (ref 0.0–0.4)
Eos: 2 %
Hematocrit: 41 % (ref 34.0–46.6)
Hemoglobin: 13.6 g/dL (ref 11.1–15.9)
Immature Grans (Abs): 0 x10E3/uL (ref 0.0–0.1)
Immature Granulocytes: 0 %
Lymphocytes Absolute: 1.9 x10E3/uL (ref 0.7–3.1)
Lymphs: 30 %
MCH: 26.8 pg (ref 26.6–33.0)
MCHC: 33.2 g/dL (ref 31.5–35.7)
MCV: 81 fL (ref 79–97)
Monocytes Absolute: 0.4 x10E3/uL (ref 0.1–0.9)
Monocytes: 6 %
Neutrophils Absolute: 3.7 x10E3/uL (ref 1.4–7.0)
Neutrophils: 62 %
Platelets: 177 x10E3/uL (ref 150–450)
RBC: 5.07 x10E6/uL (ref 3.77–5.28)
RDW: 14.1 % (ref 11.7–15.4)
WBC: 6.1 x10E3/uL (ref 3.4–10.8)

## 2018-10-01 LAB — BASIC METABOLIC PANEL WITH GFR
BUN/Creatinine Ratio: 13 (ref 9–23)
BUN: 11 mg/dL (ref 6–24)
CO2: 24 mmol/L (ref 20–29)
Calcium: 9.3 mg/dL (ref 8.7–10.2)
Chloride: 106 mmol/L (ref 96–106)
Creatinine, Ser: 0.86 mg/dL (ref 0.57–1.00)
GFR calc Af Amer: 93 mL/min/1.73
GFR calc non Af Amer: 81 mL/min/1.73
Glucose: 123 mg/dL — ABNORMAL HIGH (ref 65–99)
Potassium: 3.9 mmol/L (ref 3.5–5.2)
Sodium: 141 mmol/L (ref 134–144)

## 2018-10-01 LAB — MAGNESIUM: Magnesium: 2 mg/dL (ref 1.6–2.3)

## 2018-10-01 LAB — VITAMIN D 25 HYDROXY (VIT D DEFICIENCY, FRACTURES): Vit D, 25-Hydroxy: 14.5 ng/mL — ABNORMAL LOW (ref 30.0–100.0)

## 2018-10-01 MED ORDER — VITAMIN D (ERGOCALCIFEROL) 1.25 MG (50000 UNIT) PO CAPS: 50000.0000 [IU] | ORAL_CAPSULE | ORAL | 0 refills | Status: DC

## 2018-10-01 NOTE — Progress Notes (Signed)
Patient ID: Sabrina Ortiz, female   DOB: 06/01/1971, 48 y.o.   MRN: [id number]  Patient with recent labs and her Vitamin D level remained low at 14 and she will need Rx for Vit D sent to her pharmacy. Patient's magnesium level is now normal at 2.0 and she will be contacted with all lab results and she can stop her magnesium but will be instructed to recheck her Mag level in 3 months. Order placed.

## 2018-10-03 MED FILL — VIT D2 1.25 MG (50,000 UNIT: 1.25 MG | 28 days supply | Qty: 4 | Fill #0

## 2018-10-05 ENCOUNTER — Telehealth: Payer: Self-pay | Admitting: *Deleted

## 2018-10-05 NOTE — Telephone Encounter (Signed)
Patient verified DOB Patient is aware of labs being normal except for needing the vitamin d supplement to replace her low levels. Patient is aware of no longer needing the extra magnesium and to have all levels rechecked in 3 months. No further question.

## 2018-10-05 NOTE — Telephone Encounter (Signed)
-----   Message from Cain Saupe, MD sent at 10/01/2018  2:29 PM EST ----- Please notify patient that her Vitamin D level was still low and a RX will be sent to her pharmacy for once weekly Vit D. Magnesium level was normal. She can stop her magnesium supplement but should have her magnesium level rechecked in about 3 months. Her complete blood count was normal. Her BMP was normal, non-fasting blood sugar of 123.

## 2018-10-07 MED FILL — ENTRESTO 24 MG-26 MG TABLET: 24-26 | 30 days supply | Qty: 60 | Fill #0

## 2018-10-17 MED FILL — FUROSEMIDE 20 MG TABLET: 20 | 30 days supply | Qty: 30 | Fill #1

## 2018-10-17 MED FILL — OMEPRAZOLE 20 MG CAP: 20 | 30 days supply | Qty: 30 | Fill #2

## 2018-10-17 MED FILL — VIT D2 1.25 MG (50,000 UNIT: 1.25 MG | 56 days supply | Qty: 8 | Fill #0

## 2018-10-20 MED FILL — ENTRESTO 24 MG-26 MG TABLET: 24-26 | 30 days supply | Qty: 60 | Fill #0

## 2018-11-04 MED FILL — CARVEDILOL 6.25 MG TABLET: 6.25 | 30 days supply | Qty: 60 | Fill #0

## 2018-11-04 MED FILL — POTASSIUM CHLORIDE ER 10 ME: 10 | 30 days supply | Qty: 30 | Fill #1

## 2018-11-11 ENCOUNTER — Ambulatory Visit: Payer: Medicare Other | Attending: Family Medicine | Admitting: Family Medicine

## 2018-11-11 ENCOUNTER — Ambulatory Visit: Payer: Self-pay | Attending: Family Medicine | Admitting: Licensed Clinical Social Worker

## 2018-11-11 ENCOUNTER — Encounter: Payer: Self-pay | Admitting: Family Medicine

## 2018-11-11 VITALS — BP 126/77 | HR 76 | Temp 98.5°F | Resp 18 | Ht 65.0 in | Wt 183.0 lb

## 2018-11-11 DIAGNOSIS — M25511 Pain in right shoulder: Secondary | ICD-10-CM

## 2018-11-11 DIAGNOSIS — G8929 Other chronic pain: Secondary | ICD-10-CM | POA: Diagnosis not present

## 2018-11-11 DIAGNOSIS — R000 Tachycardia, unspecified: Secondary | ICD-10-CM

## 2018-11-11 DIAGNOSIS — I10 Essential (primary) hypertension: Secondary | ICD-10-CM | POA: Diagnosis not present

## 2018-11-11 DIAGNOSIS — I5022 Chronic systolic (congestive) heart failure: Secondary | ICD-10-CM | POA: Diagnosis not present

## 2018-11-11 DIAGNOSIS — M545 Low back pain, unspecified: Secondary | ICD-10-CM

## 2018-11-11 DIAGNOSIS — F411 Generalized anxiety disorder: Secondary | ICD-10-CM

## 2018-11-11 DIAGNOSIS — F419 Anxiety disorder, unspecified: Secondary | ICD-10-CM

## 2018-11-11 DIAGNOSIS — F331 Major depressive disorder, recurrent, moderate: Secondary | ICD-10-CM

## 2018-11-11 MED ORDER — BUSPIRONE HCL 10 MG PO TABS: 10.0000 mg | ORAL_TABLET | Freq: Three times a day (TID) | ORAL | 1 refills | Status: DC

## 2018-11-11 MED FILL — busPIRone HCL 10 MG TABS: 10 | 30 days supply | Qty: 90 | Fill #0

## 2018-11-11 NOTE — Progress Notes (Signed)
Established Patient Office Visit  Subjective:  Patient ID: Sabrina Ortiz, female    DOB: 08/21/1971  Age: 48 y.o. MRN: [id number]  CC:  Chief Complaint  Patient presents with  . Follow-up    HPI ZYKIRA MATLACK presents for follow-up of hypertension.  Patient additionally with congestive heart failure, chronic back pain and history of anxiety disorder.  Patient also states at today's visit that she is had recent recurrent right shoulder pain.  Patient states that she has increased pain with certain arm movements including trying to reach backwards with her right arm.  Patient states that the pain is mostly in the shoulder and upper outer arm and is usually a dull ache.  Patient has increased pain in the right upper arm/shoulder area if she tries to sleep on the right side.  Patient also has some difficulty reaching/lifting her right arm at the shoulder.  Patient with complaint of continued chronic dull/aching and sometimes sharp pain in the middle of her lower back.  Pain is about a 8 on a 0-to-10 scale of 0 being no pain and 10 being the worst imaginable pain.  Patient has pain both with standing greater than 20 to 30 minutes and sometimes pain can occur within 10 minutes of standing.  Patient also has discomfort/pain in her lower back with sitting and with trying to find a comfortable position in which to sleep.  Patient states that she has not currently taking any medication for pain as she states that her cardiologist told her that she cannot take ibuprofen/naproxen or Tylenol.      Patient states that she was told by her heart doctor that she needs to exercise but patient states that when she attempts to walk for exercise she feels very fatigued and feels short of breath.  Patient states that she has not been able to exercise for more than 5 minutes at a time.  Patient believes that she is permanently disabled because of her health conditions.  Patient denies any actual chest pain or chest  tightness.  Patient however does feel that she is very easily fatigued by even minimal/daily household activities.       Patient also reports episodes of feeling as if her heart is racing/increased heart rate.  Patient states that she also continues to have issues with feeling very anxious.  She reports that she was on medication in the past for her anxiety.  Patient states that she previously took " that medicine that everybody likes but it did not really do anything for me".  Patient states that her cardiologist took her off of all of her anxiety medications.  Upon questioning, patient believes that the prior medication was Xanax and that she may have possibly been on sertraline/Zoloft in the past but did not feel that the Zoloft was effective.  Patient denies any suicidal thoughts or ideations but feels that anxiety does interfere with her quality of life.  Past Medical History:  Diagnosis Date  . Angio-edema   . Anxiety   . Chronic back pain   . COPD (chronic obstructive pulmonary disease) (HCC)   . Depression   . Hypertension   . Mental disorder   . PTSD (post-traumatic stress disorder)   . Urticaria     Past Surgical History:  Procedure Laterality Date  . CYST REMOVAL HAND    . RIGHT/LEFT HEART CATH AND CORONARY ANGIOGRAPHY N/A 04/12/2018   Procedure: RIGHT/LEFT HEART CATH AND CORONARY ANGIOGRAPHY;  Surgeon: Orpah Cobb, MD;  Location: [facility];  Service: Cardiovascular;  Laterality: N/A;    Family History  Problem Relation Age of Onset  . Breast cancer Mother     Social History   Socioeconomic History  . Marital status: Single    Spouse name: Not on file  . Number of children: Not on file  . Years of education: Not on file  . Highest education level: Not on file  Occupational History  . Not on file  Social Needs  . Financial resource strain: Not on file  . Food insecurity:    Worry: Not on file    Inability: Not on file  . Transportation needs:    Medical:  Not on file    Non-medical: Not on file  Tobacco Use  . Smoking status: Never Smoker  . Smokeless tobacco: Never Used  Substance and Sexual Activity  . Alcohol use: No  . Drug use: No  . Sexual activity: Yes  Lifestyle  . Physical activity:    Days per week: Not on file    Minutes per session: Not on file  . Stress: To some extent  Relationships  . Social connections:    Talks on phone: Not on file    Gets together: Not on file    Attends religious service: Not on file    Active member of club or organization: Not on file    Attends meetings of clubs or organizations: Not on file    Relationship status: Not on file  . Intimate partner violence:    Fear of current or ex partner: Not on file    Emotionally abused: Not on file    Physically abused: Not on file    Forced sexual activity: Not on file  Other Topics Concern  . Not on file  Social History Narrative  . Not on file    Outpatient Medications Prior to Visit  Medication Sig Dispense Refill  . aspirin EC 81 MG EC tablet Take 1 tablet (81 mg total) by mouth daily.    Marland Kitchen atorvastatin (LIPITOR) 40 MG tablet Take 1 tablet (40 mg total) by mouth daily at 6 PM. 30 tablet 3  . carvedilol (COREG) 6.25 MG tablet Take 1 tablet (6.25 mg total) by mouth 2 (two) times daily with a meal. 60 tablet 3  . furosemide (LASIX) 20 MG tablet Take 1 tablet (20 mg total) by mouth daily. 30 tablet 3  . magnesium oxide (MAG-OX) 400 (241.3 Mg) MG tablet Take 1 tablet (400 mg total) by mouth every Monday, Wednesday, and Friday. 15 tablet 3  . medroxyPROGESTERone (DEPO-PROVERA) 150 MG/ML injection Inject 1 mL (150 mg total) into the muscle every 3 (three) months. 1 mL 5  . Melatonin 3 MG CAPS Take 1 capsule (3 mg total) by mouth at bedtime as needed. (Patient taking differently: Take 3 mg by mouth at bedtime as needed (sleep). ) 30 capsule 11  . omeprazole (PRILOSEC) 20 MG capsule Take 1 capsule (20 mg total) by mouth daily. 90 capsule 3  . potassium  chloride (K-DUR,KLOR-CON) 10 MEQ tablet Take 1 tablet (10 mEq total) by mouth daily. 30 tablet 3  . sacubitril-valsartan (ENTRESTO) 24-26 MG Take 1 tablet by mouth 2 (two) times daily. To treat heart failure 60 tablet 11  . Vitamin D, Ergocalciferol, (DRISDOL) 1.25 MG (50000 UT) CAPS capsule Take 1 capsule (50,000 Units total) by mouth every 7 (seven) days. 16 capsule 0   No facility-administered medications prior to visit.  Allergies  Allergen Reactions  . Shellfish Allergy Anaphylaxis    ROS Review of Systems  Constitutional: Positive for fatigue. Negative for chills, diaphoresis and fever.  HENT: Negative for congestion, sore throat and trouble swallowing.   Respiratory: Positive for shortness of breath. Negative for cough and chest tightness.   Cardiovascular: Positive for palpitations (episodes of fast heart rate). Negative for chest pain and leg swelling.  Gastrointestinal: Negative for abdominal pain, blood in stool, constipation, diarrhea and nausea.  Endocrine: Negative for cold intolerance, heat intolerance, polydipsia, polyphagia and polyuria.  Musculoskeletal: Positive for arthralgias, back pain and myalgias. Negative for joint swelling.  Neurological: Negative for dizziness, light-headedness and headaches.  Hematological: Negative for adenopathy. Does not bruise/bleed easily.  Psychiatric/Behavioral: Positive for sleep disturbance. Negative for self-injury and suicidal ideas. The patient is nervous/anxious.       Objective:    Physical Exam  Constitutional: She is oriented to person, place, and time. She appears well-developed and well-nourished. No distress.  Neck: Normal range of motion. Neck supple. No thyromegaly present.  Cardiovascular: Normal rate, regular rhythm and normal heart sounds.  Pulmonary/Chest: Effort normal and breath sounds normal.  Abdominal: Soft. There is abdominal tenderness. There is no rebound and no guarding.  Musculoskeletal:         General: Tenderness present. No deformity or edema.     Comments: Patient with lumbosacral tenderness to palpation as well as bilateral throughout the lumbar paraspinous spasm with moderate lumbar paraspinous spasm.  Discomfort at the SI joints bilaterally.  Negative seated leg raise. Right shoulder with mild discomfort with palpation at the posterior lateral aspect.  Patient with discomfort with performance of empty can test.  And patient with discomfort with horizontal range of motion of the right upper arm from the side to across the front of the chest  Lymphadenopathy:    She has no cervical adenopathy.  Neurological: She is alert and oriented to person, place, and time. No cranial nerve deficit.  Skin: Skin is warm and dry.  Psychiatric: Her behavior is normal. Thought content normal.  Patient with slightly flattened/blunted affect and appears anxious at times  Nursing note and vitals reviewed.   BP 126/77 (BP Location: Left Arm, Patient Position: Sitting, Cuff Size: Normal)   Pulse 76   Temp 98.5 F (36.9 C) (Oral)   Resp 18   Ht 5\' 5"  (1.651 m)   Wt 183 lb (83 kg)   LMP 11/11/2018   SpO2 97%   BMI 30.45 kg/m  Wt Readings from Last 3 Encounters:  11/11/18 183 lb (83 kg)  09/30/18 177 lb (80.3 kg)  09/18/18 171 lb 14.4 oz (78 kg)     Health Maintenance Due  Topic Date Due  . TETANUS/TDAP  03/05/1990  . PAP SMEAR-Modifier  09/18/2016  . INFLUENZA VACCINE  04/07/2018    There are no preventive care reminders to display for this patient.  Lab Results  Component Value Date   TSH 2.345 04/09/2018   Lab Results  Component Value Date   WBC 6.1 09/30/2018   HGB 13.6 09/30/2018   HCT 41.0 09/30/2018   MCV 81 09/30/2018   PLT 177 09/30/2018   Lab Results  Component Value Date   NA 141 09/30/2018   K 3.9 09/30/2018   CO2 24 09/30/2018   GLUCOSE 123 (H) 09/30/2018   BUN 11 09/30/2018   CREATININE 0.86 09/30/2018   BILITOT 0.3 04/27/2018   ALKPHOS 64 04/27/2018    AST 33 04/27/2018  ALT 44 (H) 04/27/2018   PROT 6.5 04/27/2018   ALBUMIN 4.0 04/27/2018   CALCIUM 9.3 09/30/2018   ANIONGAP 8 09/18/2018   Lab Results  Component Value Date   CHOL 190 09/17/2018   Lab Results  Component Value Date   HDL 48 09/17/2018   Lab Results  Component Value Date   LDLCALC 117 (H) 09/17/2018   Lab Results  Component Value Date   TRIG 123 09/17/2018   Lab Results  Component Value Date   CHOLHDL 4.0 09/17/2018   Lab Results  Component Value Date   HGBA1C 6.3 (H) 04/14/2018      Assessment & Plan:  1. Essential hypertension Patient with hypertension which is well controlled at today's visit and stable.  Blood pressure of 126/77.  Patient is to continue her Sherryll Burger which treats both hypertension and heart failure, continue potassium supplement, continue Lasix, and Coreg  - Basic Metabolic Panel  2. Chronic systolic heart failure [facility]) Patient with chronic systolic heart failure for which she follows up with cardiology.  Patient with complaint of fatigue and occasional shortness of breath with exertion which she states limits her ability to exercise.  Patient was advised to contact her cardiologist for further follow-up and also to inquire with her cardiologist if she is a candidate for cardiac rehab which can help build her exercise tolerance.  Patient will have BMP in follow-up of use of diuretic medication and referral placed for cardiology the patient was advised that since she is an established patient she can also call her cardiologist office to make an appointment - Basic Metabolic Panel - Ambulatory referral to Cardiology  3. Increased heart rate Patient with complaint of episodes of increased heart rate.  Patient's pulse is within normal at today's visit at 76.  Patient will have TSH and T4 to determine if she may have a thyroid disorder.  Patient however also has anxiety and may be having panic attacks which cause increased heart rate.  If  patient with continued increased heart rate episodes, she may also benefit from referral for cardiac monitoring to look for possible arrhythmia/SVT or paroxysmal atrial fibrillation with rapid heart rate - TSH + free T4  4. Chronic midline low back pain without sciatica Patient with complaint of chronic low back pain but does not have any current radicular type symptoms at today's visit but these have occurred in the past.  Patient agrees to be referred to physical therapy for further evaluation and treatment as she states that physical therapy was of benefit in the past. - Ambulatory referral to Physical Therapy  5. Chronic right shoulder pain Patient with complaint of chronic right shoulder pain and based on exam, patient likely has some rotator cuff tendinopathy.  Patient will be referred to orthopedic surgery for further evaluation and treatment - Ambulatory referral to Orthopedic Surgery  6. GAD (generalized anxiety disorder) Patient with history of generalized anxiety disorder and patient reports that her cardiologist had her discontinue her prior medication.  Per her report, she was apparently on Xanax to take as needed.  Discussed patient taking a daily medication to help control her anxiety and patient agreed to try daily medication.  Prescription provided for BuSpar 10 mg 3 times daily and patient will return to clinic in 2 to 4 weeks for follow-up.  Patient will also have TSH and T4 to make sure that she does not have any thyroid issues which may be contribute to her anxiety.  Patient was also referred to  social work for counseling and to set up further mental health follow-up.  Fortunately, social worker was available to meet with the patient today while the patient was still in clinic. - TSH + free T4 - Ambulatory referral to Social Work - busPIRone (BUSPAR) 10 MG tablet; Take 1 tablet (10 mg total) by mouth 3 (three) times daily. To treat anxiety  Dispense: 90 tablet; Refill: 1  An  After Visit Summary was printed and given to the patient.  Allergies as of 11/11/2018      Reactions   Shellfish Allergy Anaphylaxis      Medication List       Accurate as of November 11, 2018 11:59 PM. Always use your most recent med list.        aspirin 81 MG EC tablet Take 1 tablet (81 mg total) by mouth daily.   atorvastatin 40 MG tablet Commonly known as:  LIPITOR Take 1 tablet (40 mg total) by mouth daily at 6 PM.   busPIRone 10 MG tablet Commonly known as:  BUSPAR Take 1 tablet (10 mg total) by mouth 3 (three) times daily. To treat anxiety   carvedilol 6.25 MG tablet Commonly known as:  COREG Take 1 tablet (6.25 mg total) by mouth 2 (two) times daily with a meal.   furosemide 20 MG tablet Commonly known as:  LASIX Take 1 tablet (20 mg total) by mouth daily.   magnesium oxide 400 (241.3 Mg) MG tablet Commonly known as:  MAG-OX Take 1 tablet (400 mg total) by mouth every Monday, Wednesday, and Friday.   medroxyPROGESTERone 150 MG/ML injection Commonly known as:  DEPO-PROVERA Inject 1 mL (150 mg total) into the muscle every 3 (three) months.   Melatonin 3 MG Caps Take 1 capsule (3 mg total) by mouth at bedtime as needed.   omeprazole 20 MG capsule Commonly known as:  PRILOSEC Take 1 capsule (20 mg total) by mouth daily.   potassium chloride 10 MEQ tablet Commonly known as:  K-DUR,KLOR-CON Take 1 tablet (10 mEq total) by mouth daily.   sacubitril-valsartan 24-26 MG Commonly known as:  ENTRESTO Take 1 tablet by mouth 2 (two) times daily. To treat heart failure   Vitamin D (Ergocalciferol) 1.25 MG (50000 UT) Caps capsule Commonly known as:  DRISDOL Take 1 capsule (50,000 Units total) by mouth every 7 (seven) days.        Follow-up: Return in about 3 weeks (around 12/02/2018) for anxiety.   Cain Saupe, MD

## 2018-11-11 NOTE — Progress Notes (Signed)
Patient complains of chronic lower back pain flaring up over the past week. Patient complains of right shoulder pain aching and limiting ROM over the past 2 weeks.  Patient complains she is thirsty all the time while taking lasix and feels like she is battling against the medication.

## 2018-11-12 ENCOUNTER — Encounter: Payer: Self-pay | Admitting: Family Medicine

## 2018-11-12 LAB — BASIC METABOLIC PANEL WITH GFR
BUN/Creatinine Ratio: 14 (ref 9–23)
BUN: 13 mg/dL (ref 6–24)
CO2: 16 mmol/L — ABNORMAL LOW (ref 20–29)
Calcium: 8.9 mg/dL (ref 8.7–10.2)
Chloride: 108 mmol/L — ABNORMAL HIGH (ref 96–106)
Creatinine, Ser: 0.94 mg/dL (ref 0.57–1.00)
GFR calc Af Amer: 84 mL/min/1.73
GFR calc non Af Amer: 72 mL/min/1.73
Glucose: 108 mg/dL — ABNORMAL HIGH (ref 65–99)
Potassium: 4.2 mmol/L (ref 3.5–5.2)
Sodium: 140 mmol/L (ref 134–144)

## 2018-11-12 LAB — TSH+FREE T4
Free T4: 1.08 ng/dL (ref 0.82–1.77)
TSH: 1.67 u[IU]/mL (ref 0.450–4.500)

## 2018-11-14 ENCOUNTER — Telehealth: Payer: Self-pay | Admitting: *Deleted

## 2018-11-14 ENCOUNTER — Other Ambulatory Visit (HOSPITAL_COMMUNITY)
Admission: RE | Admit: 2018-11-14 | Discharge: 2018-11-14 | Disposition: A | Discharge status: Home / Self Care | Payer: Medicare Other | Source: Ambulatory Visit | Attending: Advanced Practice Midwife | Admitting: Advanced Practice Midwife

## 2018-11-14 ENCOUNTER — Ambulatory Visit: Payer: Medicare Other | Admitting: Advanced Practice Midwife

## 2018-11-14 ENCOUNTER — Encounter: Payer: Self-pay | Admitting: Advanced Practice Midwife

## 2018-11-14 VITALS — BP 127/82 | HR 64 | Wt 183.0 lb

## 2018-11-14 DIAGNOSIS — Z3042 Encounter for surveillance of injectable contraceptive: Secondary | ICD-10-CM | POA: Diagnosis not present

## 2018-11-14 DIAGNOSIS — Z0000 Encounter for general adult medical examination without abnormal findings: Secondary | ICD-10-CM | POA: Insufficient documentation

## 2018-11-14 DIAGNOSIS — Z01419 Encounter for gynecological examination (general) (routine) without abnormal findings: Secondary | ICD-10-CM | POA: Diagnosis not present

## 2018-11-14 DIAGNOSIS — Z124 Encounter for screening for malignant neoplasm of cervix: Secondary | ICD-10-CM

## 2018-11-14 MED ORDER — MEDROXYPROGESTERONE ACETATE 150 MG/ML IM SUSP
150.0000 mg | Freq: Once | INTRAMUSCULAR | Status: AC
  Administered 2018-11-14: 150 mg via INTRAMUSCULAR

## 2018-11-14 NOTE — Telephone Encounter (Signed)
-----   Message from Cain Saupe, MD sent at 11/12/2018  7:41 PM EST ----- Notify patient of normal thyroid blood tests and that her non-fasting BMP was normal

## 2018-11-14 NOTE — BH Specialist Note (Signed)
Integrated Behavioral Health Initial Visit  MRN: [id number] Name: Sabrina Ortiz  Number of Integrated Behavioral Health Clinician visits:: 1/6 Session Start time: 4:20 PM  Session End time: 4:45 PM Total time: 25 minutes  Type of Service: Integrated Behavioral Health- Individual Interpretor:No. Interpretor Name and Language: N/A   Warm Hand Off Completed.       SUBJECTIVE: Sabrina Ortiz is a 48 y.o. female accompanied by self Patient was referred by Dr. Jillyn Hidden for depression and anxiety. Patient reports the following symptoms/concerns: Pt has been hospitalized twice since August 2019. She was recently diagnosed with CHF and is experiencing pain in shoulder/arm that has negatively impacted her mobility.  Duration of problem: August 2019; Severity of problem: moderate  OBJECTIVE: Mood: Anxious and Affect: Appropriate Risk of harm to self or others: No plan to harm self or others  LIFE CONTEXT: Family and Social: Pt receives support from family and friends School/Work: No financial concerns noted Self-Care: Pt is participating in medication management Life Changes: Pt has difficulty coping with mental health triggered by ongoing medical conditions and chronic pain  GOALS ADDRESSED: Patient will: 1. Reduce symptoms of: anxiety and depression 2. Increase knowledge and/or ability of: self-management skills  3. Demonstrate ability to: Increase healthy adjustment to current life circumstances and Increase motivation to adhere to plan of care  INTERVENTIONS: Interventions utilized: Solution-Focused Strategies  Standardized Assessments completed: GAD-7 and PHQ 2&9  ASSESSMENT: Patient currently experiencing depression and anxiety. Pt has been hospitalized twice since August 2019. She was recently diagnosed with CHF and is experiencing pain in shoulder/arm that has negatively impacted her mobility. Pt complains of heart palpitations, chronic pain, and low motivation/energy. She  denies suicidal/homocidal ideations.    Patient may benefit from psychotherapy. Her depression and anxiety scores have decreased since last appointment with PCP. She engages in deep breathing to cope with anxiety; however, feels discouraged that she has difficulty losing weight due to inability to exercise. LCSWA informed pt of SMART goals and the importance of establishing realistic goals that are attainable. Validation and encouragement was provided.   Pt shared difficulty re-initiating services with [facility]. LCSWA provided supportive resources for psychotherapy and medication management.   PLAN: 1. Follow up with behavioral health clinician on : Pt was encouraged to contact LCSWA if symptoms worsen or fail to improve to schedule behavioral appointments at [facility]. 2. Behavioral recommendations: LCSWA recommends that pt apply healthy coping skills discussed, comply with medication management, and establish SMART goals to promote healthier well-being. Pt is encouraged to schedule follow up appointment with LCSWA 3. Referral(s): Integrated [employer] (In Clinic) 4. "From scale of 1-10, how likely are you to follow plan?":   Bridgett Larsson, LCSW 11/14/2018 5:22 PM

## 2018-11-14 NOTE — Progress Notes (Signed)
    Sabrina Ortiz 1971-05-24 [id number]   History:    48 y.o.  [id number] perimenopausal female for annual gyn exam. She was recently diagnosed with congestive heart failure.  Past medical history,surgical history, family history and social history were all reviewed and documented in the EPIC chart.  Gynecologic History Patient's last menstrual period was 11/11/2018. Contraception: Depo-Provera injections Last Pap: 09/18/2013. Results were: normal Last mammogram: unk. Results were: unk  Obstetric History OB History  Gravida Para Term Preterm AB Living  3 1     2 1   SAB TAB Ectopic Multiple Live Births    2          # Outcome Date GA Lbr Len/2nd Weight Sex Delivery Anes PTL Lv  3 TAB           2 TAB           1 Para              ROS: A ROS was performed and pertinent positives and negatives are included in the history.  GENERAL: No fevers or chills. HEENT: No change in vision, no earache, sore throat or sinus congestion. NECK: No pain or stiffness. CARDIOVASCULAR: No chest pain or pressure. No palpitations. PULMONARY: No shortness of breath, cough or wheeze. GASTROINTESTINAL: No abdominal pain, nausea, vomiting or diarrhea, melena or bright red blood per rectum. GENITOURINARY: No urinary frequency, urgency, hesitancy or dysuria. MUSCULOSKELETAL: No joint or muscle pain, no back pain, no recent trauma. DERMATOLOGIC: No rash, no itching, no lesions. ENDOCRINE: No polyuria, polydipsia, no heat or cold intolerance. No recent change in weight. HEMATOLOGICAL: No anemia or easy bruising or bleeding. NEUROLOGIC: No headache, seizures, numbness, tingling or weakness. PSYCHIATRIC: No depression, no loss of interest in normal activity or change in sleep pattern.     Exam: chaperone present  BP 127/82   Pulse 64   Wt 83 kg   LMP 11/11/2018   BMI 30.45 kg/m   Body mass index is 30.45 kg/m.  General appearance : Well developed well nourished female. No acute distress HEENT: Eyes: no  retinal hemorrhage or exudates,  Neck supple, trachea midline, no carotid bruits, no thyroidmegaly Lungs: Clear to auscultation, no rhonchi or wheezes, or rib retractions  Heart: Regular rate and rhythm, no murmurs or gallops Breast:Examined in sitting and supine position were symmetrical in appearance, no palpable masses or tenderness,  no skin retraction, no nipple inversion, no nipple discharge, no skin discoloration, no axillary or supraclavicular lymphadenopathy Abdomen: no palpable masses or tenderness, no rebound or guarding Extremities: no edema or skin discoloration or tenderness  Pelvic:  Bartholin, Urethra, Skene Glands: Within normal limits             Vagina: No gross lesions or discharge  Cervix: No gross lesions or discharge  Uterus: normal size, shape and consistency, non-tender and mobile  Adnexa  Without masses or tenderness  Anus and perineum  normal   Rectovaginal  normal sphincter tone without palpated masses or tenderness       Assessment/Plan:  48 y.o. female for annual exam  - Depo-Provera shot today for management of perimenopausal bleeding - Counseled on alternative methods for bleeding management, prefers Depo  - Pt has referral for mammogram, to call and schedule  - Pt has referral for weight management clinic, may want to switch off Depo at next visit   Bernerd Limbo, Student-MidWife, 3:44 PM 11/14/2018

## 2018-11-14 NOTE — Telephone Encounter (Signed)
Patient verified DOB Patient is aware of labs being normal and to follow up as planned. No further questions. 

## 2018-11-16 LAB — CYTOLOGY - PAP: HPV: NOT DETECTED

## 2018-11-17 ENCOUNTER — Telehealth: Payer: Self-pay | Admitting: *Deleted

## 2018-11-17 NOTE — Telephone Encounter (Signed)
-----   Message from Armando Reichert, CNM sent at 11/17/2018  7:52 AM EDT ----- Patient has atypical glandular cells on pap. Per [facility] he needs a colpo and endometrial biopsy. Please call her with these results and scheduled appointments.

## 2018-11-17 NOTE — Telephone Encounter (Signed)
I called Sabrina Ortiz to notify of abnormal pap results and reccommendations. I explained what a colposcopy and endometrial biopsy are. I answered her questions and notified her registrars will call her an appointment for colposcopy/ endometrial biopsy. She voices understanding.

## 2018-11-18 ENCOUNTER — Other Ambulatory Visit: Payer: Self-pay

## 2018-11-18 ENCOUNTER — Ambulatory Visit
Admission: RE | Admit: 2018-11-18 | Discharge: 2018-11-18 | Disposition: A | Discharge status: Home / Self Care | Payer: Medicare Other | Source: Ambulatory Visit | Attending: Family Medicine | Admitting: Family Medicine

## 2018-11-18 DIAGNOSIS — Z1239 Encounter for other screening for malignant neoplasm of breast: Secondary | ICD-10-CM

## 2018-11-18 DIAGNOSIS — Z1231 Encounter for screening mammogram for malignant neoplasm of breast: Secondary | ICD-10-CM | POA: Diagnosis not present

## 2018-11-18 MED FILL — OMEPRAZOLE 20 MG CAP: 20 | 30 days supply | Qty: 30 | Fill #3

## 2018-11-18 MED FILL — FUROSEMIDE 20 MG TABLET: 20 | 30 days supply | Qty: 30 | Fill #2

## 2018-11-23 ENCOUNTER — Encounter (INDEPENDENT_AMBULATORY_CARE_PROVIDER_SITE_OTHER): Payer: Self-pay | Admitting: Orthopaedic Surgery

## 2018-11-23 ENCOUNTER — Ambulatory Visit (INDEPENDENT_AMBULATORY_CARE_PROVIDER_SITE_OTHER): Payer: Medicare Other | Admitting: Orthopaedic Surgery

## 2018-11-23 ENCOUNTER — Ambulatory Visit (INDEPENDENT_AMBULATORY_CARE_PROVIDER_SITE_OTHER): Payer: Medicare Other

## 2018-11-23 ENCOUNTER — Other Ambulatory Visit: Payer: Self-pay

## 2018-11-23 DIAGNOSIS — I110 Hypertensive heart disease with heart failure: Secondary | ICD-10-CM | POA: Diagnosis not present

## 2018-11-23 DIAGNOSIS — M19011 Primary osteoarthritis, right shoulder: Secondary | ICD-10-CM

## 2018-11-23 DIAGNOSIS — G8929 Other chronic pain: Secondary | ICD-10-CM

## 2018-11-23 DIAGNOSIS — I5023 Acute on chronic systolic (congestive) heart failure: Secondary | ICD-10-CM | POA: Diagnosis not present

## 2018-11-23 DIAGNOSIS — M25511 Pain in right shoulder: Secondary | ICD-10-CM

## 2018-11-23 DIAGNOSIS — M7541 Impingement syndrome of right shoulder: Secondary | ICD-10-CM | POA: Diagnosis not present

## 2018-11-23 DIAGNOSIS — I160 Hypertensive urgency: Secondary | ICD-10-CM | POA: Diagnosis not present

## 2018-11-23 MED ORDER — METHYLPREDNISOLONE ACETATE 40 MG/ML IJ SUSP
40.0000 mg | INTRAMUSCULAR | Status: AC | PRN
  Administered 2018-11-23: 40 mg via INTRA_ARTICULAR

## 2018-11-23 MED ORDER — LIDOCAINE HCL 2 % IJ SOLN
2.0000 mL | INTRAMUSCULAR | Status: AC | PRN
  Administered 2018-11-23: 2 mL

## 2018-11-23 MED ORDER — BUPIVACAINE HCL 0.25 % IJ SOLN
2.0000 mL | INTRAMUSCULAR | Status: AC | PRN
  Administered 2018-11-23: 2 mL via INTRA_ARTICULAR

## 2018-11-23 NOTE — Progress Notes (Signed)
Office Visit Note   Patient: Sabrina Ortiz           Date of Birth: [birth year]           MRN: [id number] Visit Date: 11/23/2018              Requested by: Cain Saupe, MD [address] [place], [place] [address] PCP: Cain Saupe, MD   Assessment & Plan: Visit Diagnoses:  1. Chronic right shoulder pain     Plan: Impression is right shoulder rotator cuff tendinitis and impingement syndrome and cervical spine radiculopathy.  I believe the majority of her pain is coming from her shoulder.  We will inject the subacromial space with cortisone today.  She is not significantly better over the next few weeks she will call and let us know we will obtain an MRI to assess her rotator cuff.  She will follow-up with [place] as needed.  Follow-Up Instructions: Return if symptoms worsen or fail to improve.   Orders:  Orders Placed This Encounter  Procedures  . Large Joint Inj: R subacromial bursa  . XR Shoulder Right   No orders of the defined types were placed in this encounter.     Procedures: Large Joint Inj: R subacromial bursa on 11/23/2018 2:29 PM Indications: pain Details: 22 G needle Medications: 2 mL bupivacaine 0.25 %; 2 mL lidocaine 2 %; 40 mg methylPREDNISolone acetate 40 MG/ML Outcome: tolerated well, no immediate complications Patient was prepped and draped in the usual sterile fashion.       Clinical Data: No additional findings.   Subjective: Chief Complaint  Patient presents with  . Right Shoulder - Pain    HPI patient is a pleasant 48 year old right-hand-dominant female who presents our clinic today with right shoulder pain.  This began in August 2019.  No known injury or change in activity.  The pain she has is to the deltoid.  It is worsened over the past few months.  She describes this as a constant ache worsened with forward flexion and internal rotation.  She is unable to take many over-the-counter medications secondary to cardiac issues.  She does  note numbness and tingling at times to her forearm, hand and all 5 fingers.  No previous shoulder or neck pathology.  No previous cortisone injection to the right shoulder.  Review of Systems as detailed in HPI.  All others reviewed and are negative.   Objective: Vital Signs: LMP 11/11/2018   Physical Exam well-developed well-nourished female in no acute distress.  Alert and oriented x3.  Ortho Exam examination of her right shoulder reveals approximately 25% active range of motion in all planes.  I can passively get her to about 50%.  This is secondary to pain.  Markedly positive empty can and cross body abduction.  Moderate tenderness over the [facility] joint.  She can internally rotate to her back pocket.  Specialty Comments:  No specialty comments available.  Imaging: Xr Shoulder Right  Result Date: 11/23/2018 X-rays of the right shoulder show moderate AC degenerative changes and a type III acromion    PMFS History: Patient Active Problem List   Diagnosis Date Noted  . Chronic right shoulder pain 11/23/2018  . Acute on chronic left systolic heart failure (HCC) 09/14/2018  . Prediabetes 04/27/2018  . Acute pulmonary edema (HCC) 04/09/2018  . Systolic heart failure (HCC) 04/09/2018  . Hypertensive emergency   . Allergic reaction 03/29/2018  . Angioedema 03/29/2018  . Recurrent urticaria 03/29/2018  . Rhinitis  03/29/2018  . Vitamin D deficiency 02/22/2018  . Anxiety 03/31/2017  . Trichomonas infection 07/17/2016  . Herpes genitalis in women 07/17/2016  . Essential hypertension 06/15/2013  . Dyslipidemia 06/15/2013  . Neuropathy 06/15/2013  . Generalized anxiety disorder 03/10/2012  . Major depressive disorder, recurrent, 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HCC) 03/08/2012   Past Medical History:  Diagnosis Date  . Angio-edema   . Anxiety   . Chronic back pain   . COPD (chronic obstructive pulmonary disease) (HCC)   . Depression   . Hypertension   . Mental disorder   . PTSD  (post-traumatic stress disorder)   . Urticaria     Family History  Problem Relation Age of Onset  . Breast cancer Mother   . Breast cancer Maternal Aunt   . Breast cancer Maternal Grandmother     Past Surgical History:  Procedure Laterality Date  . CYST REMOVAL HAND    . RIGHT/LEFT HEART CATH AND CORONARY ANGIOGRAPHY N/A 04/12/2018   Procedure: RIGHT/LEFT HEART CATH AND CORONARY ANGIOGRAPHY;  Surgeon: Orpah Cobb, MD;  [facility];  Service: Cardiovascular;  Laterality: N/A;   Social History   Occupational History  . Not on file  Tobacco Use  . Smoking status: Never Smoker  . Smokeless tobacco: Never Used  Substance and Sexual Activity  . Alcohol use: No  . Drug use: No  . Sexual activity: Yes

## 2018-11-28 ENCOUNTER — Telehealth (INDEPENDENT_AMBULATORY_CARE_PROVIDER_SITE_OTHER): Payer: Self-pay | Admitting: Orthopaedic Surgery

## 2018-11-28 NOTE — Telephone Encounter (Signed)
See message below. Appt scheduled for 12/02/2018.

## 2018-11-28 NOTE — Telephone Encounter (Signed)
Patient called stated cortisone didn't work and was told to come back in two weeks.  Scheduled appt ad asked ALL screening Questions and ALL answers were NO.

## 2018-11-29 MED FILL — ATORVASTATIN CALCIUM 40 MG: 40 | 30 days supply | Qty: 30 | Fill #3

## 2018-11-29 MED FILL — VIT D2 1.25 MG (50,000 UNIT: 1.25 MG | 56 days supply | Qty: 8 | Fill #1

## 2018-11-29 MED FILL — POTASSIUM CHLORIDE ER 10 ME: 10 | 30 days supply | Qty: 30 | Fill #2

## 2018-11-29 NOTE — Telephone Encounter (Signed)
Ok we'll see her at her appt

## 2018-12-01 ENCOUNTER — Ambulatory Visit (INDEPENDENT_AMBULATORY_CARE_PROVIDER_SITE_OTHER): Payer: Medicare Other | Admitting: Orthopaedic Surgery

## 2018-12-02 ENCOUNTER — Ambulatory Visit (INDEPENDENT_AMBULATORY_CARE_PROVIDER_SITE_OTHER): Payer: Medicare Other | Admitting: Orthopaedic Surgery

## 2018-12-09 ENCOUNTER — Telehealth: Payer: Self-pay | Admitting: Family Medicine

## 2018-12-09 NOTE — Telephone Encounter (Signed)
Patient left message as to whether she could increase her dose of Lasix for 3 days.  Patient was contacted by phone and patient states that she has noticed some increase in lower extremity swelling as well as increased shortness of breath.  Patient also states that she has cold symptoms, nasal congestion which she believes is secondary to recent changes in the temperature/weather.  Patient also states that she stopped weighing herself on a daily basis.  Patient states that she has called her cardiologist both last week and this week and has not received any reply therefore she placed a call to this office.  Patient would like to be able to increase her Lasix but was unsure how to do so as she did not know she needed to take the Lasix several times daily she currently takes Lasix 40 mg once per day.  Patient does have CHF.  Patient was instructed to resume weighing herself on a daily basis and she can increase her dose of Lasix to 1 pill twice daily x3 days but should call next week if her weight has not decreased or continues to increase.  Patient should also limit her fluid intake this weekend as well as avoid any extra salt in her diet.  If patient has acute shortness of breath or increase in peripheral edema she should go to urgent care or emergency department for further evaluation.  Patient is also encouraged to leave another message with her cardiologist regarding her current symptoms.

## 2018-12-09 NOTE — Telephone Encounter (Signed)
Patient wants to know can she take two of her lasix for a couple of days

## 2018-12-09 NOTE — Telephone Encounter (Signed)
She can but she should also contact her cardiologist if she is having increased leg swelling or Shortness of breath

## 2018-12-12 ENCOUNTER — Ambulatory Visit: Payer: Self-pay | Admitting: Family Medicine

## 2018-12-12 ENCOUNTER — Ambulatory Visit: Payer: Medicare Other | Admitting: Obstetrics & Gynecology

## 2018-12-12 NOTE — Telephone Encounter (Signed)
The patient called in to reschedule as she has a cold.

## 2018-12-14 ENCOUNTER — Ambulatory Visit: Payer: Self-pay | Admitting: Obstetrics & Gynecology

## 2018-12-15 ENCOUNTER — Telehealth (HOSPITAL_COMMUNITY): Payer: Self-pay | Admitting: *Deleted

## 2018-12-15 MED FILL — CARVEDILOL 6.25 MG TABLET: 6.25 | 30 days supply | Qty: 60 | Fill #1

## 2018-12-15 MED FILL — OMEPRAZOLE 20 MG CAP: 20 | 30 days supply | Qty: 30 | Fill #4

## 2018-12-15 MED FILL — FUROSEMIDE 20 MG TABS: 20 | 30 days supply | Qty: 30 | Fill #3

## 2018-12-15 NOTE — Telephone Encounter (Signed)
Received paper referral along with office notes from Dr. Algie Coffer for this pt to participate in Cardiac Rehab with the diagnosis of systolic heart failure.  Pt most recent echo in January showed EF 25%.  Called and spoke to pt regarding participation in Cardiac Rehab.  Pt is interested.  Reviewed general medicare coverage and eligibility.  Advised pt that due to the national pandemic covid-19 we are closed to patients.  Once we are permitted to schedule she will be contacted. Will send brochure about the cardiac rehab program and exercises/stretches she can do at home while maintaining social distancing. Alanson Aly, BSN Cardiac and Emergency planning/management officer

## 2018-12-27 ENCOUNTER — Other Ambulatory Visit: Payer: Self-pay

## 2018-12-27 ENCOUNTER — Encounter (INDEPENDENT_AMBULATORY_CARE_PROVIDER_SITE_OTHER): Payer: Self-pay | Admitting: Orthopaedic Surgery

## 2018-12-27 ENCOUNTER — Ambulatory Visit (INDEPENDENT_AMBULATORY_CARE_PROVIDER_SITE_OTHER): Payer: Medicare Other | Admitting: Orthopaedic Surgery

## 2018-12-27 VITALS — Ht 65.0 in | Wt 176.0 lb

## 2018-12-27 DIAGNOSIS — G8929 Other chronic pain: Secondary | ICD-10-CM | POA: Diagnosis not present

## 2018-12-27 DIAGNOSIS — M25511 Pain in right shoulder: Secondary | ICD-10-CM | POA: Diagnosis not present

## 2018-12-27 MED ORDER — TRAMADOL HCL 50 MG PO TABS: ORAL_TABLET | ORAL | 2 refills | Status: DC

## 2018-12-27 MED FILL — traMADol HCL 50 MG TABS: 50 | 5 days supply | Qty: 30 | Fill #0

## 2018-12-27 NOTE — Progress Notes (Signed)
Office Visit Note   Patient: Sabrina Ortiz           Date of Birth: [birth year]           MRN: [id number] Visit Date: 12/27/2018              Requested by: Cain SaupeFulp, Cammie, MD [address] [place], [place] [id number] PCP: Cain SaupeFulp, Cammie, MD   Assessment & Plan: Visit Diagnoses:  1. Chronic right shoulder pain     Plan: Impression is chronic right shoulder pain concerning for attritional rotator cuff tear.  Due to the COVID-19 pandemic in the inability to obtain an MRI for possible rotator cuff tear.  We did refer the patient to Dr. Prince Romehilts today for further evaluation under ultrasound.  He was able to see an intrasubstance tear to the supraspinatus.  We will start the patient in formal physical therapy.  A prescription was provided to her for this today.  I have called in tramadol as well.  She will follow-up with us in 6 weeks time for recheck.  We will get an MRI once they open back up.  Call with concerns or questions in the meantime.  Follow-Up Instructions: Return if symptoms worsen or fail to improve.   Orders:  No orders of the defined types were placed in this encounter.  Meds ordered this encounter  Medications  . traMADol (ULTRAM) 50 MG tablet    Sig: Take one to two tabs po q6-8 hours prn pain    Dispense:  30 tablet    Refill:  2      Procedures: No procedures performed   Clinical Data: No additional findings.   Subjective: Chief Complaint  Patient presents with  . Right Shoulder - Pain    HPI patient is a pleasant 48 year old female who presents our clinic today with continued right shoulder pain.  This is been ongoing since August 2019.  The pain has continually worsened.  She was seen in our office approximately a month ago where she was given a subacromial cortisone injection.  She thinks this may have helped, but only for an hour or so.  Her pain has returned and is continued to worsen.  Pain she has primarily to the deltoid.  Worse with any motion of  the shoulder.  She is even having trouble wiping.  She is unable to take over-the-counter anti-inflammatories due to cardiac issues.  She does note occasional numbness and tingling to all 5 fingers.  Review of Systems as detailed in HPI.  All others reviewed and are negative.   Objective: Vital Signs: Ht 5\' 5"  (1.651 m)   Wt 176 lb (79.8 kg)   BMI 29.29 kg/m   Physical Exam well-developed and well-nourished female in no acute distress.  Alert and oriented x3.  Ortho Exam examination of her right shoulder is very limited secondary to pain.  She is neurovascularly intact distally.  Specialty Comments:  No specialty comments available.  Imaging: No new imaging   PMFS History: Patient Active Problem List   Diagnosis Date Noted  . Chronic right shoulder pain 11/23/2018  . Acute on chronic left systolic heart failure (HCC) 09/14/2018  . Prediabetes 04/27/2018  . Acute pulmonary edema (HCC) 04/09/2018  . Systolic heart failure (HCC) 04/09/2018  . Hypertensive emergency   . Allergic reaction 03/29/2018  . Angioedema 03/29/2018  . Recurrent urticaria 03/29/2018  . Rhinitis 03/29/2018  . Vitamin D deficiency 02/22/2018  . Anxiety 03/31/2017  . Trichomonas infection 07/17/2016  .  Herpes genitalis in women 07/17/2016  . Essential hypertension 06/15/2013  . Dyslipidemia 06/15/2013  . Neuropathy 06/15/2013  . Generalized anxiety disorder 03/10/2012  . Major depressive disorder, recurrent, 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HCC) 03/08/2012   Past Medical History:  Diagnosis Date  . Angio-edema   . Anxiety   . Chronic back pain   . COPD (chronic obstructive pulmonary disease) (HCC)   . Depression   . Hypertension   . Mental disorder   . PTSD (post-traumatic stress disorder)   . Urticaria     Family History  Problem Relation Age of Onset  . Breast cancer Mother   . Breast cancer Maternal Aunt   . Breast cancer Maternal Grandmother     Past Surgical History:  Procedure  Laterality Date  . CYST REMOVAL HAND    . RIGHT/LEFT HEART CATH AND CORONARY ANGIOGRAPHY N/A 04/12/2018   Procedure: RIGHT/LEFT HEART CATH AND CORONARY ANGIOGRAPHY;  Surgeon: Orpah Cobb, MD;  Location: [facility];  Service: Cardiovascular;  Laterality: N/A;   Social History   Occupational History  . Not on file  Tobacco Use  . Smoking status: Never Smoker  . Smokeless tobacco: Never Used  Substance and Sexual Activity  . Alcohol use: No  . Drug use: No  . Sexual activity: Yes

## 2018-12-27 NOTE — Progress Notes (Signed)
Diagnostic ultrasound right shoulder: 12 MHz linear probe was used.  Biceps tendon is located in its groove, there is no fluid in the sheath and no subluxation of the tendon.  Subscapularis has normal appearance.  The supraspinatus has mid tendon intrasubstance partial tear with no retraction.  There is enlargement of the tendon and it is slightly hypoechoic indicating tendinopathy.  Infraspinatus has normal appearance.

## 2018-12-28 ENCOUNTER — Ambulatory Visit: Payer: Medicare Other | Admitting: Obstetrics & Gynecology

## 2019-01-09 ENCOUNTER — Other Ambulatory Visit: Payer: Self-pay | Admitting: Family Medicine

## 2019-01-09 DIAGNOSIS — I5022 Chronic systolic (congestive) heart failure: Secondary | ICD-10-CM

## 2019-01-09 MED FILL — POTASSIUM CHLORIDE ER 10 ME: 10 | 30 days supply | Qty: 30 | Fill #3

## 2019-01-09 MED FILL — FUROSEMIDE 20 MG TABS: 20 | 30 days supply | Qty: 30 | Fill #0

## 2019-01-10 MED FILL — VIT D2 1.25 MG (50,000 UNIT: 1.25 MG | 84 days supply | Qty: 12 | Fill #0

## 2019-01-20 ENCOUNTER — Telehealth (HOSPITAL_COMMUNITY): Payer: Self-pay | Admitting: *Deleted

## 2019-01-20 NOTE — Telephone Encounter (Signed)
[id number] Follow-up call made to patient to see how she is progressing with exercise at home. Pt states that she did not receive packet from cardiac rehab in the mail, therefore I will resend stretches to patient that she can do at home. Pt states she is doing some walking at home, about 2 days/week. Pt states that she is amenable to e-mail and online communication, e-mail given. Will continue to follow.  Artist Pais, MS, ACSM CEP

## 2019-01-31 ENCOUNTER — Ambulatory Visit: Payer: Self-pay

## 2019-02-01 ENCOUNTER — Other Ambulatory Visit: Payer: Self-pay

## 2019-02-01 ENCOUNTER — Other Ambulatory Visit (HOSPITAL_COMMUNITY)
Admission: RE | Admit: 2019-02-01 | Discharge: 2019-02-01 | Disposition: A | Discharge status: Home / Self Care | Payer: Medicare Other | Source: Ambulatory Visit | Attending: Obstetrics & Gynecology | Admitting: Obstetrics & Gynecology

## 2019-02-01 ENCOUNTER — Encounter: Payer: Self-pay | Admitting: Obstetrics & Gynecology

## 2019-02-01 ENCOUNTER — Ambulatory Visit (INDEPENDENT_AMBULATORY_CARE_PROVIDER_SITE_OTHER): Payer: Medicare Other | Admitting: Obstetrics & Gynecology

## 2019-02-01 VITALS — BP 139/94 | HR 66 | Temp 98.0°F | Wt 184.6 lb

## 2019-02-01 DIAGNOSIS — R87619 Unspecified abnormal cytological findings in specimens from cervix uteri: Secondary | ICD-10-CM | POA: Diagnosis not present

## 2019-02-01 DIAGNOSIS — Z3042 Encounter for surveillance of injectable contraceptive: Secondary | ICD-10-CM

## 2019-02-01 DIAGNOSIS — N871 Moderate cervical dysplasia: Secondary | ICD-10-CM

## 2019-02-01 DIAGNOSIS — N870 Mild cervical dysplasia: Secondary | ICD-10-CM | POA: Diagnosis not present

## 2019-02-01 DIAGNOSIS — N8500 Endometrial hyperplasia, unspecified: Secondary | ICD-10-CM | POA: Insufficient documentation

## 2019-02-01 DIAGNOSIS — R87612 Low grade squamous intraepithelial lesion on cytologic smear of cervix (LGSIL): Secondary | ICD-10-CM | POA: Insufficient documentation

## 2019-02-01 DIAGNOSIS — N938 Other specified abnormal uterine and vaginal bleeding: Secondary | ICD-10-CM

## 2019-02-01 LAB — POCT PREGNANCY, URINE: Preg Test, Ur: NEGATIVE

## 2019-02-01 MED ORDER — MEDROXYPROGESTERONE ACETATE 150 MG/ML IM SUSP
150.0000 mg | Freq: Once | INTRAMUSCULAR | Status: AC
  Administered 2019-02-01: 10:00:00 150 mg via INTRAMUSCULAR

## 2019-02-01 NOTE — Progress Notes (Signed)
Ultrasound scheduled for June 4 @ 1530

## 2019-02-01 NOTE — Progress Notes (Signed)
   Subjective:    Patient ID: Sabrina Ortiz, female    DOB: December 18, 1970, 48 y.o.   MRN: [id number]  HPI 48 yo P1 (48 yo) here for colpo and EMBX due to AGUS pap recently. She also has DUB, normal TSH, but last gyn u/s was in 2017 (normal). She has been on depo provera. She is due for a shot today. She is abstinent.   Review of Systems Normal gyn u/s in 2017    Objective:   Physical Exam Breathing, conversing, and ambulating normally Well nourished, well hydrated Black female, no apparent distress UPT negative, consent signed, time out done Cervix sprayed with Hurricaine spray Cervix prepped with acetic acid. Transformation zone seen in its entirety. Colpo adequate. Normal colpo findings Cervix grasped with tenaculum. ECC obtained. EMBX done with moderate tissue obtained. Uterus sounded to about 9 cm She tolerated the procedure well.     Assessment & Plan:  AGUS- await EMBX and ECC DUB- gyn u/s ordered We discussed Minerva ablation. She is interested, aware that future pregnancy is contraindicated. Virtual visit in 2 weeks

## 2019-02-09 ENCOUNTER — Ambulatory Visit (HOSPITAL_COMMUNITY)
Admission: RE | Admit: 2019-02-09 | Discharge: 2019-02-09 | Disposition: A | Discharge status: Home / Self Care | Payer: Medicare Other | Source: Ambulatory Visit | Attending: Obstetrics & Gynecology | Admitting: Obstetrics & Gynecology

## 2019-02-09 ENCOUNTER — Other Ambulatory Visit: Payer: Self-pay

## 2019-02-09 DIAGNOSIS — N920 Excessive and frequent menstruation with regular cycle: Secondary | ICD-10-CM | POA: Diagnosis not present

## 2019-02-09 DIAGNOSIS — N938 Other specified abnormal uterine and vaginal bleeding: Secondary | ICD-10-CM | POA: Diagnosis not present

## 2019-02-09 MED FILL — CARVEDILOL 6.25 MG TABLET: 6.25 | 30 days supply | Qty: 60 | Fill #2

## 2019-02-09 MED FILL — OMEPRAZOLE 20 MG CAP: 20 | 30 days supply | Qty: 30 | Fill #5

## 2019-02-09 MED FILL — FUROSEMIDE 20 MG TABS: 20 | 30 days supply | Qty: 30 | Fill #1

## 2019-02-15 ENCOUNTER — Other Ambulatory Visit: Payer: Self-pay

## 2019-02-15 ENCOUNTER — Encounter: Payer: Self-pay | Admitting: Obstetrics & Gynecology

## 2019-02-15 ENCOUNTER — Telehealth: Payer: Self-pay | Admitting: Obstetrics & Gynecology

## 2019-02-15 ENCOUNTER — Telehealth (INDEPENDENT_AMBULATORY_CARE_PROVIDER_SITE_OTHER): Payer: Medicare Other | Admitting: Obstetrics & Gynecology

## 2019-02-15 VITALS — BP 164/98 | HR 55

## 2019-02-15 DIAGNOSIS — N938 Other specified abnormal uterine and vaginal bleeding: Secondary | ICD-10-CM

## 2019-02-15 NOTE — Progress Notes (Signed)
TELEHEALTH GYNECOLOGY VIRTUAL VIDEO VISIT ENCOUNTER NOTE  Provider location: Center for [employer] at [facility]   I connected with Sabrina Ortiz on 02/15/19 at  2:15 PM EDT by WebEx Encounter at home and verified that I am speaking with the correct person using two identifiers.   I discussed the limitations, risks, security and privacy concerns of performing an evaluation and management service by telephone and the availability of in person appointments. I also discussed with the patient that there may be a patient responsible charge related to this service. The patient expressed understanding and agreed to proceed.   History:  Sabrina Ortiz is a 48 y.o. [id number] (48 yo P19) female being evaluated today for DUB and follow up of her pathology. She had an AGUS pap followed by a normal colpo. 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 [facility] was normal. She is currently getting depo provera which controls her bleeding but she has gained 10 pounds in the last 2 months.     Past Medical History:  Diagnosis Date  . Angio-edema   . Anxiety   . Chronic back pain   . COPD (chronic obstructive pulmonary disease) ([place])   . Depression   . Hypertension   . Mental disorder   . PTSD (post-traumatic stress disorder)   . Urticaria    Past Surgical History:  Procedure Laterality Date  . CYST REMOVAL HAND    . RIGHT/LEFT HEART CATH AND CORONARY ANGIOGRAPHY N/A 04/12/2018   Procedure: RIGHT/LEFT HEART CATH AND CORONARY ANGIOGRAPHY;  Surgeon: Dixie Dials, MD;  Location: [facility];  Service: Cardiovascular;  Laterality: N/A;   The following portions of the patient's history were reviewed and updated as appropriate: allergies, current medications, past family history, past medical history, past social history, past surgical history and problem list.   Mammogram normal 3/20  Review of Systems:  Pertinent items noted in HPI and remainder of  comprehensive ROS otherwise negative.  Physical Exam:   General:  Alert, oriented and cooperative. Patient appears to be in no acute distress.  Mental Status: Normal mood and affect. Normal behavior. Normal judgment and thought content.   Respiratory: Normal respiratory effort, no problems with respiration noted  Rest of physical exam deferred due to type of encounter  Labs and Imaging No results found for this or any previous visit (from the past 336 hour(s)). US Pelvis Transvanginal Non-ob (tv Only)  Result Date: 02/09/2019 CLINICAL DATA:  Dysfunctional uterine bleeding, menses increased in frequency and duration EXAM: ULTRASOUND PELVIS TRANSVAGINAL TECHNIQUE: Transvaginal ultrasound examination of the pelvis was performed including evaluation of the uterus, ovaries, adnexal regions, and pelvic cul-de-sac. Transabdominal imaging was not ordered. COMPARISON:  09/17/2015 FINDINGS: Uterus Measurements: 7.3 x 3.5 x 3.7 cm = volume: 50 mL. Anteverted in bed minimally anteflexed. Heterogeneous echogenicity. Hypoechoic nodule with slight bulge of the serosal margin of the anterior mid uterus, cannot exclude subtle 11 mm subserosal leiomyoma. Endometrium Thickness: 5 mm. Tiny subendometrial cyst. No endometrial fluid or other focal abnormalities. Right ovary Not visualized on either transabdominal or endovaginal imaging, likely obscured by bowel Left ovary Measurements: 2.7 x 1.5 x 1.5 cm = volume: 3.1 mL. Normal morphology without mass Other findings:  No free pelvic fluid or adnexal masses. IMPRESSION: Nonvisualization of RIGHT ovary. Cannot exclude subtle 11 mm subserosal anterior mid uterine leiomyoma. No other significant pelvic sonographic abnormalities. Electronically Signed   By: Lavonia Dana M.D.   On: 02/09/2019 16:34  Assessment and Plan:     AGUS pap- rec repeat pap with cotesting in 1 year DUB- She thinks that she wants to have an ablation. Based on her uterine size, I would recommend an  HTA. Based on her cardiac issues, I would consider regional anesthesia. She declines an IUD. (has fears of this) When she decides she will send me a My chart message      I discussed the assessment and treatment plan with the patient. The patient was provided an opportunity to ask questions and all were answered. The patient agreed with the plan and demonstrated an understanding of the instructions.   The patient was advised to call back or seek an in-person evaluation/go to the ED if the symptoms worsen or if the condition fails to improve as anticipated.  I provided  minutes of face-to-face time during this encounter.   Allie BossierMyra C Bryn Perkin, MD [facility] for [facility], [facility]

## 2019-02-15 NOTE — Progress Notes (Signed)
I connected with  Sabrina Ortiz on 02/15/19 at  2:15 PM EDT by telephone and verified that I am speaking with the correct person using two identifiers.   I discussed the limitations, risks, security and privacy concerns of performing an evaluation and management service by telephone and the availability of in person appointments. I also discussed with the patient that there may be a patient responsible charge related to this service. The patient expressed understanding and agreed to proceed.  Derinda Late, RN 02/15/2019  2:01 PM

## 2019-02-15 NOTE — Telephone Encounter (Signed)
Attempted to call patient about her appointment today, and how she needed to have her MyChart app downloaded in order to have this visit.

## 2019-02-20 ENCOUNTER — Other Ambulatory Visit: Payer: Self-pay | Admitting: Family Medicine

## 2019-02-20 DIAGNOSIS — I5022 Chronic systolic (congestive) heart failure: Secondary | ICD-10-CM

## 2019-02-21 ENCOUNTER — Other Ambulatory Visit: Payer: Self-pay

## 2019-02-21 ENCOUNTER — Telehealth (HOSPITAL_COMMUNITY): Payer: Self-pay | Admitting: *Deleted

## 2019-02-21 ENCOUNTER — Encounter (HOSPITAL_COMMUNITY)
Admission: RE | Admit: 2019-02-21 | Discharge: 2019-02-21 | Disposition: A | Discharge status: Home / Self Care | Payer: Self-pay | Source: Ambulatory Visit | Attending: Cardiovascular Disease | Admitting: Cardiovascular Disease

## 2019-02-21 ENCOUNTER — Telehealth (HOSPITAL_COMMUNITY): Payer: Self-pay

## 2019-02-21 NOTE — Telephone Encounter (Signed)
Pt is interested in participating in Virtual Cardiac Rehab. Pt advised that Virtual Cardiac Rehab is provided at no cost to the patient.  Checklist:  1. Pt has smart device  ie smartphone and/or ipad for downloading an app  Yes 2. Reliable internet/wifi service    Yes 3. Understands how to use their smartphone and navigate within an app.  Yes   Reviewed with pt the scheduling process for virtual cardiac rehab.  Pt verbalized understanding.  Called and spoke to pt regarding Virtual Cardiac Rehab.  Pt  was able to download the [facility] app on their smart device with no issues. Pt set up their account and received the following welcome message -"Welcome to the [facility] and Pulmonary Rehabilitation program. We hope that you will find the exercise program beneficial in your recovery process. Our staff is available to assist with any questions/concerns about your exercise routine. Best wishes". Brief orientation provided to with the advisement to watch the "Intro to Rehab" series located under the Resource tab. Pt verbalized understanding. Will continue to follow and monitor pt progress with feedback as needed.Barnet Pall, RN,BSN 02/21/2019 3:05 PM

## 2019-02-21 NOTE — Telephone Encounter (Signed)

## 2019-02-22 MED FILL — POTASSIUM CHLORIDE ER 10 ME: 10 | 30 days supply | Qty: 30 | Fill #0

## 2019-02-23 ENCOUNTER — Telehealth (HOSPITAL_COMMUNITY): Payer: Self-pay

## 2019-02-23 NOTE — Telephone Encounter (Signed)
Phone call to Virtual Cardiac Rehab pt for nutrition education and counseling for heart healthy diet. Pt denied any nutrition education needs at this time. Distributed RD contact information in case of any questions.   Laurina Bustle, MS, RD, LDN 02/23/2019 1:39 PM

## 2019-02-27 ENCOUNTER — Other Ambulatory Visit: Payer: Self-pay | Admitting: Family Medicine

## 2019-02-27 MED ORDER — ATORVASTATIN CALCIUM 40 MG PO TABS: 40.0000 mg | ORAL_TABLET | Freq: Every day | ORAL | 1 refills | Status: DC

## 2019-02-27 NOTE — Telephone Encounter (Signed)
1) Medication(s) Requested (by name): atorvastatin (LIPITOR) 40 MG tablet [[id number]]   2) Pharmacy of Choice: [place], [place] 3) Special Requests:   Approved medications will be sent to the pharmacy, we will reach out if there is an issue.  Requests made after 3pm may not be addressed until the following business day!  If a patient is unsure of the name of the medication(s) please note and ask patient to call back when they are able to provide all info, do not send to responsible party until all information is available!

## 2019-02-28 ENCOUNTER — Encounter (HOSPITAL_COMMUNITY): Admission: RE | Admit: 2019-02-28 | Payer: Self-pay | Source: Ambulatory Visit

## 2019-02-28 ENCOUNTER — Telehealth (HOSPITAL_COMMUNITY): Payer: Self-pay | Admitting: *Deleted

## 2019-02-28 ENCOUNTER — Encounter (HOSPITAL_COMMUNITY)
Admission: RE | Admit: 2019-02-28 | Discharge: 2019-02-28 | Disposition: A | Discharge status: Home / Self Care | Payer: Self-pay | Source: Ambulatory Visit | Attending: Cardiovascular Disease | Admitting: Cardiovascular Disease

## 2019-02-28 MED FILL — ATORVASTATIN CALCIUM 40 MG: 40 | 30 days supply | Qty: 30 | Fill #0

## 2019-02-28 NOTE — Telephone Encounter (Signed)
Spoke with patient regarding symptoms of shortness of breath that she experienced during walking yesterday afternoon. Sabrina Ortiz walked for 21 minutes. Blood pressure 124/86. Heart rate 81. Patient advised to walk when its cooler and to break up her increments of walking. Patient says she has been having difficulty contacting Dr Merrilee Jansky office about obtaining refills and scheduling a virtual visit. Dr Merrilee Jansky office was called and notified to contact patient

## 2019-03-15 MED FILL — OMEPRAZOLE 20 MG CAP: 20 | 30 days supply | Qty: 30 | Fill #6

## 2019-03-15 MED FILL — FUROSEMIDE 20 MG TABS: 20 | 30 days supply | Qty: 30 | Fill #2

## 2019-03-23 ENCOUNTER — Telehealth (HOSPITAL_COMMUNITY): Payer: Self-pay

## 2019-03-23 MED FILL — ?CARVEDILOL 6.25 MG TABLET: 6.25 | 30 days supply | Qty: 60 | Fill #3

## 2019-04-03 MED FILL — POTASSIUM CHLORIDE ER 10 ME: 10 | 30 days supply | Qty: 30 | Fill #1

## 2019-04-10 ENCOUNTER — Telehealth: Payer: Self-pay

## 2019-04-10 NOTE — Telephone Encounter (Signed)
Order for CPAP supplies faxed to [facility] 

## 2019-04-14 ENCOUNTER — Telehealth (HOSPITAL_COMMUNITY): Payer: Self-pay | Admitting: *Deleted

## 2019-04-14 ENCOUNTER — Encounter (HOSPITAL_COMMUNITY)
Admission: RE | Admit: 2019-04-14 | Discharge: 2019-04-14 | Disposition: A | Discharge status: Home / Self Care | Payer: Self-pay | Source: Ambulatory Visit | Attending: Cardiovascular Disease | Admitting: Cardiovascular Disease
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
## 2019-04-17 ENCOUNTER — Other Ambulatory Visit: Payer: Self-pay | Admitting: Family Medicine

## 2019-04-17 DIAGNOSIS — I5022 Chronic systolic (congestive) heart failure: Secondary | ICD-10-CM

## 2019-04-17 DIAGNOSIS — I10 Essential (primary) hypertension: Secondary | ICD-10-CM | POA: Diagnosis not present

## 2019-04-17 DIAGNOSIS — I2510 Atherosclerotic heart disease of native coronary artery without angina pectoris: Secondary | ICD-10-CM | POA: Diagnosis not present

## 2019-04-17 MED FILL — VIT D2 1.25 MG (50,000 UNIT: 1.25 MG | 27 days supply | Qty: 4 | Fill #1

## 2019-04-17 MED FILL — OMEPRAZOLE 20 MG CAP: 20 | 30 days supply | Qty: 30 | Fill #7

## 2019-04-18 ENCOUNTER — Other Ambulatory Visit: Payer: Self-pay | Admitting: Family Medicine

## 2019-04-18 DIAGNOSIS — I5022 Chronic systolic (congestive) heart failure: Secondary | ICD-10-CM

## 2019-04-18 NOTE — Telephone Encounter (Signed)
New Message   1) Medication(s) Requested (by name): Lasix  2) Pharmacy of Choice: [facility]  3) Special Requests: Pt has an appt on 9/9 at 3:10 and wants to know if she can get refills until then   Approved medications will be sent to the pharmacy, we will reach out if there is an issue.  Requests made after 3pm may not be addressed until the following business day!  If a patient is unsure of the name of the medication(s) please note and ask patient to call back when they are able to provide all info, do not send to responsible party until all information is available!

## 2019-04-19 MED ORDER — FUROSEMIDE 20 MG PO TABS: 20.0000 mg | ORAL_TABLET | Freq: Every day | ORAL | 0 refills | Status: DC

## 2019-04-19 MED FILL — ?FUROSEMIDE 20 MG TABLET: 20 | 30 days supply | Qty: 30 | Fill #0

## 2019-04-21 ENCOUNTER — Ambulatory Visit: Payer: Medicare Other

## 2019-04-28 ENCOUNTER — Telehealth: Payer: Self-pay | Admitting: *Deleted

## 2019-04-28 NOTE — Telephone Encounter (Signed)
Forms/order for patient sleep supplies were faxed to [facility] and will be sent to scan center.

## 2019-05-03 ENCOUNTER — Other Ambulatory Visit: Payer: Self-pay

## 2019-05-03 ENCOUNTER — Ambulatory Visit (INDEPENDENT_AMBULATORY_CARE_PROVIDER_SITE_OTHER): Payer: Medicare Other

## 2019-05-03 DIAGNOSIS — Z0000 Encounter for general adult medical examination without abnormal findings: Secondary | ICD-10-CM

## 2019-05-03 DIAGNOSIS — Z3042 Encounter for surveillance of injectable contraceptive: Secondary | ICD-10-CM | POA: Diagnosis not present

## 2019-05-03 MED ORDER — MEDROXYPROGESTERONE ACETATE 150 MG/ML IM SUSP
150.0000 mg | Freq: Once | INTRAMUSCULAR | Status: AC
  Administered 2019-05-03: 150 mg via INTRAMUSCULAR

## 2019-05-03 NOTE — Progress Notes (Signed)
Sabrina Ortiz here for Depo-Provera  Injection.  Injection administered without complication. Patient will return in 3 months for next injection.  [place], [place] 05/03/2019  2:05 PM

## 2019-05-04 ENCOUNTER — Telehealth: Payer: Self-pay

## 2019-05-04 DIAGNOSIS — I10 Essential (primary) hypertension: Secondary | ICD-10-CM | POA: Diagnosis not present

## 2019-05-04 DIAGNOSIS — I5022 Chronic systolic (congestive) heart failure: Secondary | ICD-10-CM | POA: Diagnosis not present

## 2019-05-04 DIAGNOSIS — I2510 Atherosclerotic heart disease of native coronary artery without angina pectoris: Secondary | ICD-10-CM | POA: Diagnosis not present

## 2019-05-04 NOTE — Telephone Encounter (Signed)
As per [facility] they have not received any recent orders for CPAP supplies.    Orders for CPAP supplies faxed to [facility].

## 2019-05-11 ENCOUNTER — Telehealth: Payer: Self-pay | Admitting: Family Medicine

## 2019-05-11 NOTE — Telephone Encounter (Signed)
Patient went to see her cardiologist  And  He up the dose  Of  sacubitril-valsartan (ENTRESTO) 24-26 MG  To  49-51  And she is in the pass program  And need a new rx   Thank you

## 2019-05-11 NOTE — Telephone Encounter (Signed)
Can you contact her cardiology office and confirm the dose increase?

## 2019-05-11 NOTE — Telephone Encounter (Signed)
I do not see record of this in Epic. Will route to PCP for review.

## 2019-05-12 ENCOUNTER — Other Ambulatory Visit: Payer: Self-pay | Admitting: Family Medicine

## 2019-05-12 ENCOUNTER — Telehealth: Payer: Self-pay | Admitting: Pharmacist

## 2019-05-12 ENCOUNTER — Encounter (HOSPITAL_COMMUNITY)
Admission: RE | Admit: 2019-05-12 | Discharge: 2019-05-12 | Disposition: A | Discharge status: Home / Self Care | Payer: Self-pay | Source: Ambulatory Visit | Attending: Cardiovascular Disease | Admitting: Cardiovascular Disease

## 2019-05-12 ENCOUNTER — Other Ambulatory Visit: Payer: Self-pay

## 2019-05-12 ENCOUNTER — Telehealth (HOSPITAL_COMMUNITY): Payer: Self-pay | Admitting: *Deleted

## 2019-05-12 DIAGNOSIS — I5022 Chronic systolic (congestive) heart failure: Secondary | ICD-10-CM

## 2019-05-12 MED ORDER — ENTRESTO 49-51 MG PO TABS: 1.0000 | ORAL_TABLET | Freq: Two times a day (BID) | ORAL | 11 refills | Status: DC

## 2019-05-12 NOTE — Progress Notes (Signed)
Patient ID: Sabrina Ortiz, female   DOB: 07/04/1971, 48 y.o.   MRN: [id number]  Patient with an increase in the dose of her Delene Loll  which is now 66-51, by her cardiologist. New order placed.
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
## 2019-05-12 NOTE — Telephone Encounter (Signed)
Dr. Merrilee Jansky office if closed for the weekend. I left a message on his pager. His operator reports that he will call me back later today.

## 2019-05-12 NOTE — Telephone Encounter (Signed)
Dr. Chapman Fitch -   Spoke with Dr. Doylene Canard concerning pt's Sabrina Ortiz. Verified that Ms. Kiker is supposed to be taking 49-51 mg BID.

## 2019-05-16 ENCOUNTER — Other Ambulatory Visit: Payer: Self-pay | Admitting: Family Medicine

## 2019-05-16 DIAGNOSIS — I5022 Chronic systolic (congestive) heart failure: Secondary | ICD-10-CM

## 2019-05-16 MED FILL — ?CARVEDILOL 6.25 MG TABLET: 6.25 | 30 days supply | Qty: 60 | Fill #0

## 2019-05-16 MED FILL — POTASSIUM CHLORIDE ER 10 ME: 10 | 30 days supply | Qty: 30 | Fill #2

## 2019-05-17 ENCOUNTER — Other Ambulatory Visit: Payer: Self-pay

## 2019-05-17 ENCOUNTER — Encounter: Payer: Self-pay | Admitting: Family Medicine

## 2019-05-17 ENCOUNTER — Ambulatory Visit: Payer: Medicare Other | Attending: Family Medicine | Admitting: Family Medicine

## 2019-05-17 DIAGNOSIS — Z9989 Dependence on other enabling machines and devices: Secondary | ICD-10-CM

## 2019-05-17 DIAGNOSIS — R7303 Prediabetes: Secondary | ICD-10-CM | POA: Diagnosis not present

## 2019-05-17 DIAGNOSIS — I10 Essential (primary) hypertension: Secondary | ICD-10-CM | POA: Diagnosis not present

## 2019-05-17 DIAGNOSIS — G4733 Obstructive sleep apnea (adult) (pediatric): Secondary | ICD-10-CM

## 2019-05-17 DIAGNOSIS — E785 Hyperlipidemia, unspecified: Secondary | ICD-10-CM | POA: Diagnosis not present

## 2019-05-17 DIAGNOSIS — E559 Vitamin D deficiency, unspecified: Secondary | ICD-10-CM | POA: Diagnosis not present

## 2019-05-17 DIAGNOSIS — I5022 Chronic systolic (congestive) heart failure: Secondary | ICD-10-CM

## 2019-05-17 MED ORDER — FUROSEMIDE 20 MG PO TABS: 20.0000 mg | ORAL_TABLET | Freq: Every day | ORAL | 1 refills | Status: DC

## 2019-05-17 MED ORDER — ATORVASTATIN CALCIUM 40 MG PO TABS: 40.0000 mg | ORAL_TABLET | Freq: Every day | ORAL | 3 refills | Status: DC

## 2019-05-17 MED ORDER — POTASSIUM CHLORIDE ER 10 MEQ PO TBCR: EXTENDED_RELEASE_TABLET | ORAL | 1 refills | Status: DC

## 2019-05-17 MED ORDER — CARVEDILOL 6.25 MG PO TABS: 6.2500 mg | ORAL_TABLET | Freq: Two times a day (BID) | ORAL | 3 refills | Status: DC

## 2019-05-17 MED FILL — ?FUROSEMIDE 20MG TABLET: 20 | 30 days supply | Qty: 30 | Fill #0

## 2019-05-17 MED FILL — ?ATORVASTATIN 40MG TABLET: 40 | 30 days supply | Qty: 30 | Fill #0
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
## 2019-05-17 NOTE — Progress Notes (Signed)
Patient verified DOB Patient has taken medication. Patient has eaten today. Patient states tramadol is really strong and causes nausea but does help with relief. Patient needs refills on lasix, potassium. Atorvastatin.

## 2019-05-22 MED FILL — **ENTRESTO 49-51 MG TABLET: 49-51 | 14 days supply | Qty: 28 | Fill #0

## 2019-05-24 ENCOUNTER — Ambulatory Visit: Payer: Medicare Other | Attending: Family Medicine

## 2019-05-24 ENCOUNTER — Other Ambulatory Visit: Payer: Self-pay

## 2019-05-25 LAB — MAGNESIUM: Magnesium: 2.1 mg/dL (ref 1.6–2.3)

## 2019-06-02 ENCOUNTER — Other Ambulatory Visit: Payer: Self-pay

## 2019-06-02 ENCOUNTER — Encounter (HOSPITAL_COMMUNITY)
Admission: RE | Admit: 2019-06-02 | Discharge: 2019-06-02 | Disposition: A | Discharge status: Home / Self Care | Payer: Self-pay | Source: Ambulatory Visit | Attending: Cardiovascular Disease | Admitting: Cardiovascular Disease

## 2019-06-02 ENCOUNTER — Encounter: Payer: Self-pay | Admitting: Family Medicine
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
## 2019-06-05 DIAGNOSIS — I5022 Chronic systolic (congestive) heart failure: Secondary | ICD-10-CM | POA: Diagnosis not present

## 2019-06-05 DIAGNOSIS — R072 Precordial pain: Secondary | ICD-10-CM | POA: Diagnosis not present

## 2019-06-05 DIAGNOSIS — I2510 Atherosclerotic heart disease of native coronary artery without angina pectoris: Secondary | ICD-10-CM | POA: Diagnosis not present

## 2019-06-05 DIAGNOSIS — I10 Essential (primary) hypertension: Secondary | ICD-10-CM | POA: Diagnosis not present

## 2019-06-20 ENCOUNTER — Other Ambulatory Visit: Payer: Self-pay | Admitting: Family Medicine

## 2019-06-20 DIAGNOSIS — K219 Gastro-esophageal reflux disease without esophagitis: Secondary | ICD-10-CM

## 2019-06-20 MED FILL — FUROSEMIDE 20 MG TABS: 20 | 30 days supply | Qty: 30 | Fill #1

## 2019-06-20 MED FILL — POTASSIUM CHLORIDE ER 10 ME: 10 | 30 days supply | Qty: 30 | Fill #0

## 2019-06-20 MED FILL — ?OMEPRazole 20mg CPDR: 20 | 30 days supply | Qty: 30 | Fill #0

## 2019-06-21 IMAGING — DX DG CHEST 1V PORT
1 series · 1 of 1 positions shown · non-contrast
Comparison: 04/09/2018

CLINICAL DATA: Acute pulmonary edema

EXAM:
PORTABLE CHEST 1 VIEW

[chest ap]
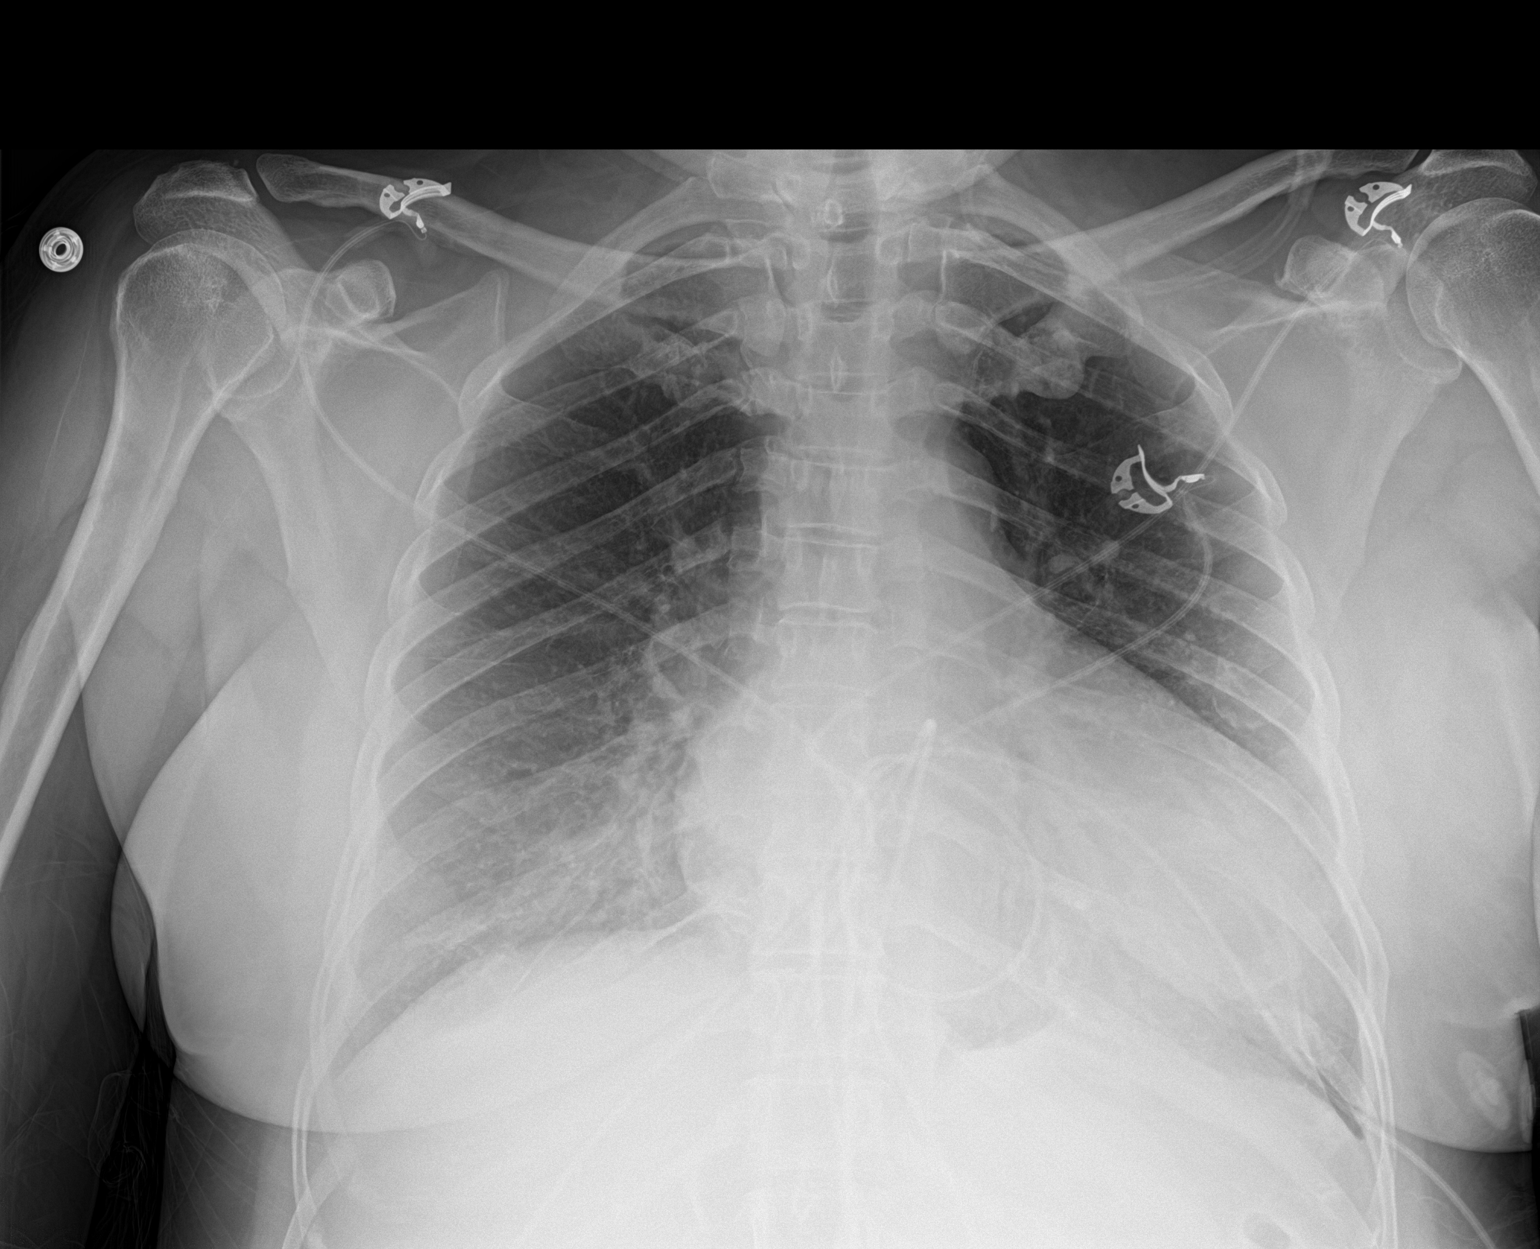

[1 of 1 positions shown; findings below may reference images not displayed]

FINDINGS: Cardiac shadow is enlarged but accentuated by the portable
technique. The lungs are well aerated bilaterally with improved
aeration in the bases. Some slight reduction in the pleural
effusions seen on prior CT are noted. No bony abnormality is seen.
IMPRESSION: Improved aeration when compare with the previous day.

## 2019-06-30 MED FILL — ?CARVEDILOL 6.25 MG TABLET: 6.25 | 30 days supply | Qty: 60 | Fill #1

## 2019-07-19 ENCOUNTER — Ambulatory Visit: Payer: Medicare Other

## 2019-07-21 ENCOUNTER — Ambulatory Visit: Payer: Medicare Other

## 2019-07-24 MED FILL — ?FUROSEMIDE 20MG TABLET: 20 | 30 days supply | Qty: 30 | Fill #2

## 2019-07-27 MED FILL — POTASSIUM CHLORIDE ER 10 ME: 10 | 30 days supply | Qty: 30 | Fill #1

## 2019-07-31 ENCOUNTER — Other Ambulatory Visit: Payer: Self-pay

## 2019-07-31 ENCOUNTER — Ambulatory Visit (INDEPENDENT_AMBULATORY_CARE_PROVIDER_SITE_OTHER): Payer: Medicare Other

## 2019-07-31 VITALS — BP 126/84 | HR 83 | Wt 186.2 lb

## 2019-07-31 DIAGNOSIS — Z3042 Encounter for surveillance of injectable contraceptive: Secondary | ICD-10-CM | POA: Diagnosis not present

## 2019-07-31 MED ORDER — MEDROXYPROGESTERONE ACETATE 150 MG/ML IM SUSP
150.0000 mg | Freq: Once | INTRAMUSCULAR | Status: AC
  Administered 2019-07-31: 150 mg via INTRAMUSCULAR

## 2019-07-31 NOTE — Progress Notes (Signed)
Sabrina Ortiz here for Depo-Provera  Injection.  Injection administered without complication. Patient will return in 3 months for next injection.  Verdell Carmine, RN 07/31/2019  3:40 PM

## 2019-08-07 MED FILL — ?OMEPRAZOLE 20MG CAP DR: 20 | 30 days supply | Qty: 30 | Fill #1

## 2019-08-07 MED FILL — ?ATORVASTATIN 40MG TABLET: 40 | 30 days supply | Qty: 30 | Fill #1

## 2019-08-17 MED FILL — ?CARVEDILOL 6.25 MG TABLET: 6.25 | 30 days supply | Qty: 60 | Fill #2

## 2019-08-23 MED FILL — ?FUROSEMIDE 20 MG TABLET: 20 | 30 days supply | Qty: 30 | Fill #3

## 2019-08-23 MED FILL — POTASSIUM CHLORIDE ER 10 ME: 10 | 30 days supply | Qty: 30 | Fill #2

## 2019-09-07 ENCOUNTER — Encounter (HOSPITAL_COMMUNITY): Payer: Self-pay | Admitting: *Deleted

## 2019-09-07 NOTE — Progress Notes (Signed)
Pt discharged from virtual cardiac rehab app, [facility] d/t completion of time in program. Data will be scanned into chart. 

## 2019-09-21 MED FILL — ?OMEPRAZOLE 20MG CAP DR: 20 | 30 days supply | Qty: 30 | Fill #2

## 2019-09-21 MED FILL — ?FUROSEMIDE 20 MG TABLET: 20 | 30 days supply | Qty: 30 | Fill #4

## 2019-10-04 MED FILL — ?CARVEDILOL 6.25 MG TABLET: 6.25 | 30 days supply | Qty: 60 | Fill #3

## 2019-10-04 MED FILL — POTASSIUM CHLORIDE ER 10 ME: 10 | 30 days supply | Qty: 30 | Fill #3

## 2019-10-16 ENCOUNTER — Other Ambulatory Visit: Payer: Self-pay

## 2019-10-16 ENCOUNTER — Ambulatory Visit (INDEPENDENT_AMBULATORY_CARE_PROVIDER_SITE_OTHER): Payer: Medicare Other

## 2019-10-16 ENCOUNTER — Telehealth: Payer: Self-pay | Admitting: Family Medicine

## 2019-10-16 VITALS — BP 135/92 | HR 91 | Wt 180.7 lb

## 2019-10-16 DIAGNOSIS — Z3042 Encounter for surveillance of injectable contraceptive: Secondary | ICD-10-CM

## 2019-10-16 MED ORDER — MEDROXYPROGESTERONE ACETATE 150 MG/ML IM SUSP
150.0000 mg | Freq: Once | INTRAMUSCULAR | Status: AC
  Administered 2019-10-16: 150 mg via INTRAMUSCULAR

## 2019-10-16 NOTE — Progress Notes (Signed)
Chart reviewed for nurse visit. Agree with plan of care.   Robynn Marcel L, DO 10/16/2019 7:32 PM   

## 2019-10-16 NOTE — Progress Notes (Signed)
Sabrina Ortiz here for Depo-Provera  Injection.  Injection administered without complication. Patient will return in 3 months for next injection.  Marjo Bicker, RN 10/16/2019  3:25 PM

## 2019-10-16 NOTE — Telephone Encounter (Signed)
Pt called to request a Nurse call her back in regards to her  -traMADol (ULTRAM) 50 MG tablet  Triage Nurse was not available. She states that for the past week her heart rate has been higher than normal, please follow up as soon as possible. She would like to speak with a Nurse before scheduling an appt with PCP

## 2019-10-17 ENCOUNTER — Other Ambulatory Visit: Payer: Self-pay | Admitting: Family Medicine

## 2019-10-17 DIAGNOSIS — I5022 Chronic systolic (congestive) heart failure: Secondary | ICD-10-CM

## 2019-10-17 DIAGNOSIS — I10 Essential (primary) hypertension: Secondary | ICD-10-CM | POA: Diagnosis not present

## 2019-10-17 DIAGNOSIS — G8929 Other chronic pain: Secondary | ICD-10-CM

## 2019-10-17 DIAGNOSIS — I2510 Atherosclerotic heart disease of native coronary artery without angina pectoris: Secondary | ICD-10-CM | POA: Diagnosis not present

## 2019-10-17 NOTE — Telephone Encounter (Signed)
Please notify patient that she can take over-the-counter Tylenol arthritis as needed for her shoulder pain and a referral has been made for her to follow-up with orthopedic doctor that she previously sought regarding her shoulder pain.  Additionally, referral has been placed for her to follow-up with her cardiologist.  If she has additional episodes of rapid heart rate and/or chest pain, she may wish to go to the emergency department for further evaluation

## 2019-10-17 NOTE — Progress Notes (Signed)
Patient ID: Sabrina Ortiz, female   DOB: 05-04-1971, 49 y.o.   MRN: [id number]  Call received from patient that she normally takes half of a tramadol as needed for shoulder pain however she recently took 1 whole pill and had increase in heart rate to 125.  Patient is calling regarding what to do regarding her shoulder pain.  On review of chart, patient has history of chronic right shoulder pain with suspected rotator cuff tendinitis and saw orthopedics in March 2020.  She was advised to return if continued pain and note also suggested an MRI may be needed.  She will be referred back to orthopedics for further evaluation and in the interim she can take over-the-counter Tylenol as needed for pain.  Patient also will be referred back to cardiology as she has not had any recent follow-up of her congestive heart failure.  Orthopedics

## 2019-10-17 NOTE — Telephone Encounter (Signed)
Please advice. Pt asked if possible a different medication for her shoulder pain. Usually taking 1/2 tramadol med but when took a full one last week her HR increased to 125. HR today reported at 91. Verbalized no other complains

## 2019-10-18 MED FILL — DICLOFENAC SODIUM 1 % GEL: 1 | 25 days supply | Qty: 100 | Fill #0

## 2019-10-19 NOTE — Telephone Encounter (Signed)
Spoke with patient / made aware of Md note. Verbalized understanding

## 2019-10-24 ENCOUNTER — Ambulatory Visit: Payer: Medicare Other | Admitting: Orthopaedic Surgery

## 2019-11-13 MED FILL — ?OMEPRAZOLE 20MG CAP DR: 20 | 30 days supply | Qty: 30 | Fill #3

## 2019-11-13 MED FILL — ?FUROSEMIDE 20 MG TABLET: 20 | 30 days supply | Qty: 30 | Fill #5

## 2019-11-14 ENCOUNTER — Other Ambulatory Visit: Payer: Self-pay | Admitting: Family Medicine

## 2019-11-14 DIAGNOSIS — I5022 Chronic systolic (congestive) heart failure: Secondary | ICD-10-CM

## 2019-11-14 MED FILL — POTASSIUM CHLORIDE ER 10 ME: 10 | 30 days supply | Qty: 30 | Fill #4

## 2019-11-25 IMAGING — DX DG CHEST 1V PORT
1 series · 1 of 1 positions shown · non-contrast
Comparison: 04/10/2018

CLINICAL DATA: Dyspnea

EXAM:
PORTABLE CHEST 1 VIEW

[chest ap]
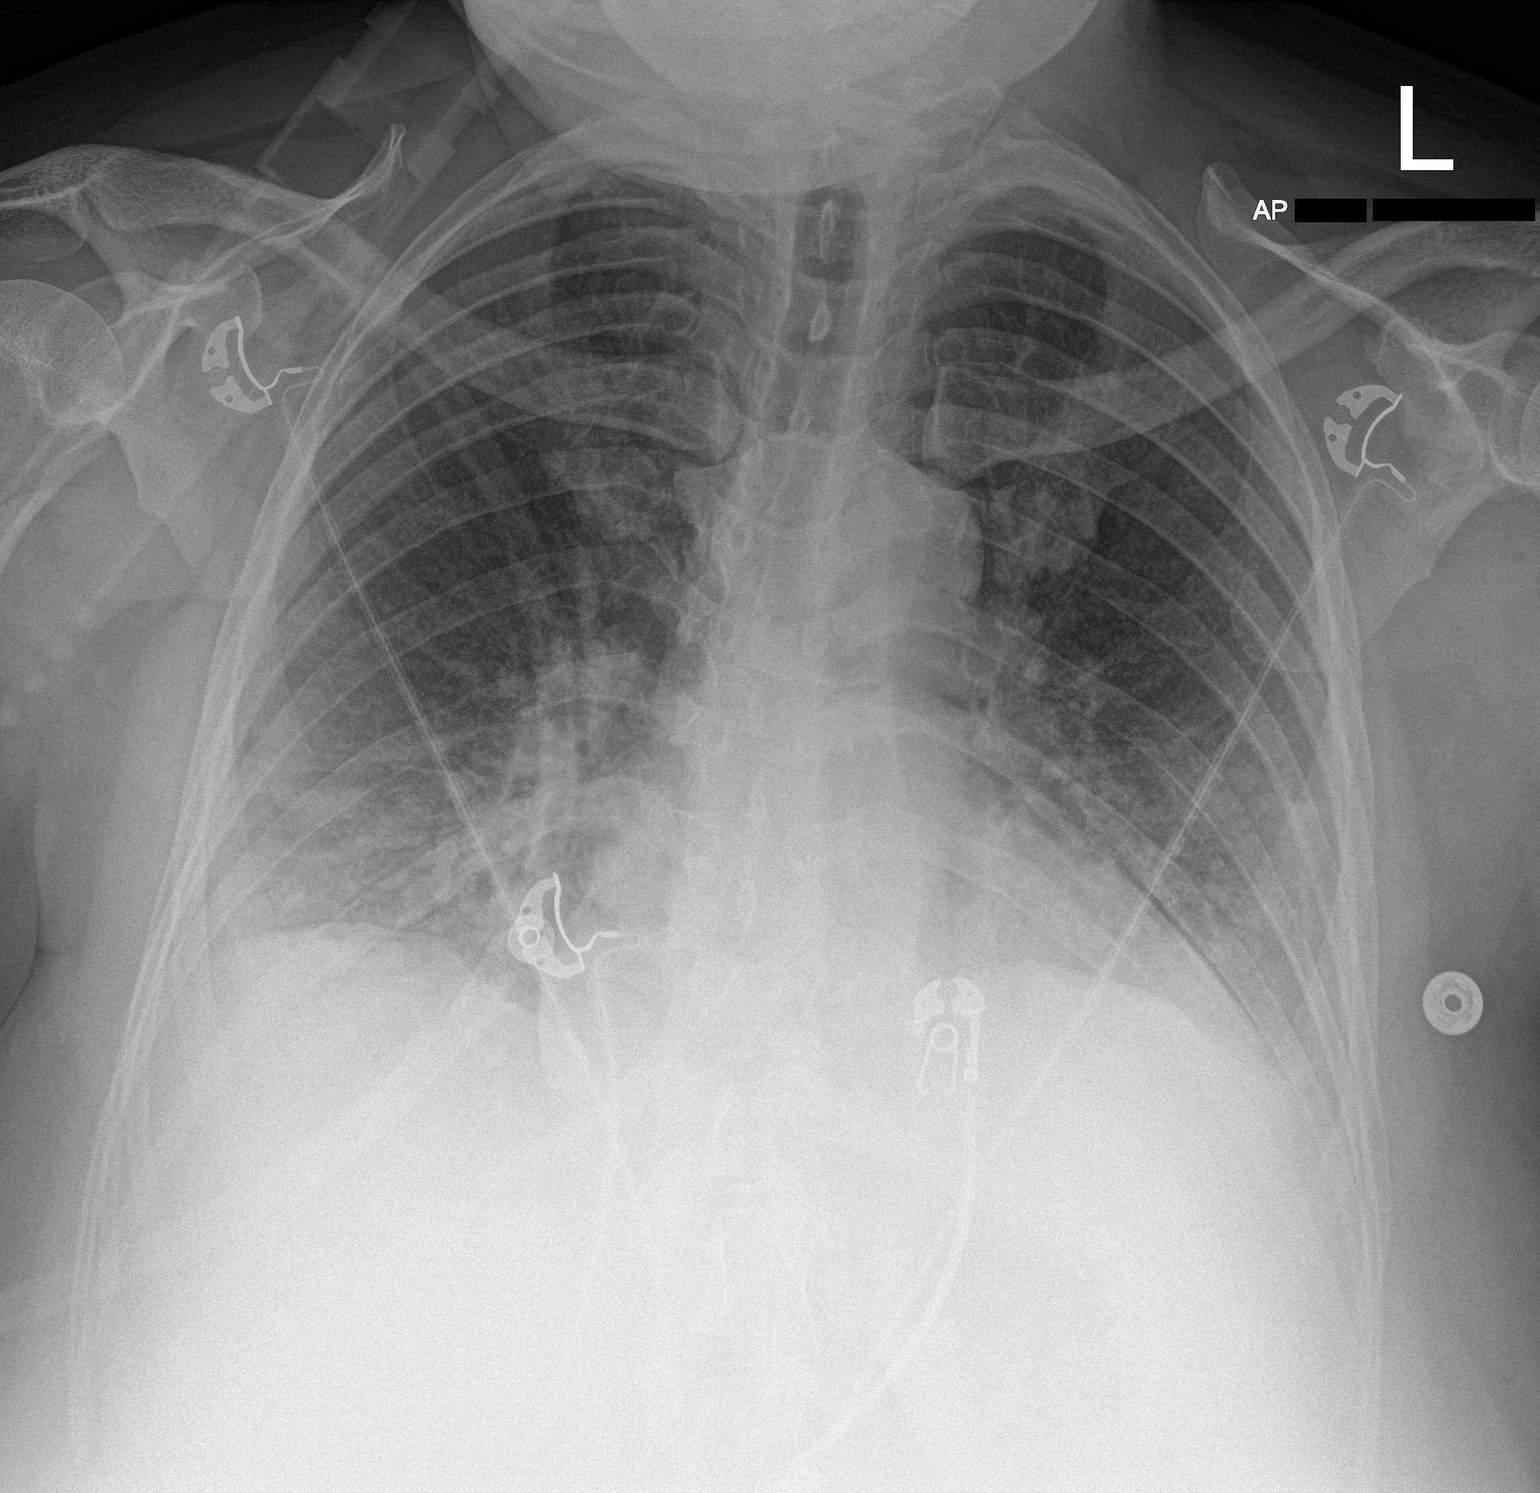

[1 of 1 positions shown; findings below may reference images not displayed]

FINDINGS: Cardiomegaly with central vascular congestion and mild diffuse
interstitial and ground-glass opacities suspicious for edema. No
large effusion. No pneumothorax.
IMPRESSION: Cardiomegaly with vascular congestion and mild diffuse interstitial
and ground-glass opacity suggesting mild pulmonary edema.

## 2019-12-07 MED FILL — ?ATORVASTATIN 40MG TABLET: 40 | 30 days supply | Qty: 30 | Fill #2

## 2019-12-07 MED FILL — CARVEDILOL 6.25 MG TABLET: 6.25 | 30 days supply | Qty: 60 | Fill #0

## 2019-12-14 ENCOUNTER — Other Ambulatory Visit: Payer: Self-pay | Admitting: Family Medicine

## 2019-12-14 DIAGNOSIS — I5022 Chronic systolic (congestive) heart failure: Secondary | ICD-10-CM

## 2019-12-14 MED FILL — FUROSEMIDE 20 MG TABS: 20 | 30 days supply | Qty: 30 | Fill #0

## 2019-12-20 ENCOUNTER — Other Ambulatory Visit: Payer: Self-pay | Admitting: Family Medicine

## 2019-12-20 DIAGNOSIS — K219 Gastro-esophageal reflux disease without esophagitis: Secondary | ICD-10-CM

## 2019-12-20 MED FILL — POTASSIUM CHLORIDE ER 10 ME: 10 | 30 days supply | Qty: 30 | Fill #5

## 2019-12-25 ENCOUNTER — Other Ambulatory Visit: Payer: Self-pay | Admitting: Family Medicine

## 2019-12-25 DIAGNOSIS — K219 Gastro-esophageal reflux disease without esophagitis: Secondary | ICD-10-CM

## 2019-12-28 ENCOUNTER — Telehealth: Payer: Self-pay | Admitting: *Deleted

## 2019-12-28 NOTE — Telephone Encounter (Signed)
LVM advising patient that her appointment was on the 27th and that was the reason for the call and to call back if she has questions about her appointment

## 2019-12-28 NOTE — Telephone Encounter (Signed)
Sabrina Ortiz left a voice message this afternoon she had a missed call. States she isn't sure if a nurse tried to call her. Bradshaw Minihan,RN

## 2020-01-01 ENCOUNTER — Ambulatory Visit: Payer: Medicare Other

## 2020-01-02 ENCOUNTER — Ambulatory Visit (INDEPENDENT_AMBULATORY_CARE_PROVIDER_SITE_OTHER): Payer: Medicare Other | Admitting: *Deleted

## 2020-01-02 ENCOUNTER — Encounter: Payer: Self-pay | Admitting: *Deleted

## 2020-01-02 ENCOUNTER — Other Ambulatory Visit: Payer: Self-pay

## 2020-01-02 VITALS — BP 128/88 | HR 91 | Temp 98.9°F | Ht 65.0 in | Wt 181.5 lb

## 2020-01-02 DIAGNOSIS — F32A Depression, unspecified: Secondary | ICD-10-CM

## 2020-01-02 DIAGNOSIS — Z3042 Encounter for surveillance of injectable contraceptive: Secondary | ICD-10-CM

## 2020-01-02 DIAGNOSIS — F329 Major depressive disorder, single episode, unspecified: Secondary | ICD-10-CM

## 2020-01-02 DIAGNOSIS — F419 Anxiety disorder, unspecified: Secondary | ICD-10-CM

## 2020-01-02 MED ORDER — MEDROXYPROGESTERONE ACETATE 150 MG/ML IM SUSP
150.0000 mg | Freq: Once | INTRAMUSCULAR | Status: AC
  Administered 2020-01-02: 14:00:00 150 mg via INTRAMUSCULAR

## 2020-01-02 NOTE — Progress Notes (Addendum)
Depo Provera 150 mg IM administered as scheduled. She tolerated well. Next injection due 7/13-7/27.  Pt was advised that she needs Annual Gyn exam w/Pap in May or June. Pt had elevated PHQ-9 and GAD-7 scores on her questionnaire today. I offered [facility] appt and she accepted. Appointment will be scheduled upon check out. Pt voiced understanding of information given.   Chart reviewed for nurse visit. Agree with plan of care.   Currie Paris, NP 01/02/2020 10:06 PM

## 2020-01-04 ENCOUNTER — Other Ambulatory Visit: Payer: Self-pay

## 2020-01-04 ENCOUNTER — Ambulatory Visit: Payer: Medicare Other | Attending: Family | Admitting: Family

## 2020-01-04 ENCOUNTER — Other Ambulatory Visit: Payer: Self-pay | Admitting: Internal Medicine

## 2020-01-04 DIAGNOSIS — Z7982 Long term (current) use of aspirin: Secondary | ICD-10-CM | POA: Insufficient documentation

## 2020-01-04 DIAGNOSIS — E785 Hyperlipidemia, unspecified: Secondary | ICD-10-CM | POA: Insufficient documentation

## 2020-01-04 DIAGNOSIS — I5022 Chronic systolic (congestive) heart failure: Secondary | ICD-10-CM | POA: Insufficient documentation

## 2020-01-04 DIAGNOSIS — F419 Anxiety disorder, unspecified: Secondary | ICD-10-CM | POA: Insufficient documentation

## 2020-01-04 DIAGNOSIS — K219 Gastro-esophageal reflux disease without esophagitis: Secondary | ICD-10-CM | POA: Diagnosis present

## 2020-01-04 DIAGNOSIS — I110 Hypertensive heart disease with heart failure: Secondary | ICD-10-CM | POA: Insufficient documentation

## 2020-01-04 DIAGNOSIS — J449 Chronic obstructive pulmonary disease, unspecified: Secondary | ICD-10-CM | POA: Insufficient documentation

## 2020-01-04 DIAGNOSIS — Z79899 Other long term (current) drug therapy: Secondary | ICD-10-CM | POA: Insufficient documentation

## 2020-01-04 DIAGNOSIS — J029 Acute pharyngitis, unspecified: Secondary | ICD-10-CM | POA: Insufficient documentation

## 2020-01-04 DIAGNOSIS — Z793 Long term (current) use of hormonal contraceptives: Secondary | ICD-10-CM | POA: Diagnosis not present

## 2020-01-04 MED ORDER — BENZONATATE 100 MG PO CAPS: 100.0000 mg | ORAL_CAPSULE | Freq: Two times a day (BID) | ORAL | 0 refills | Status: AC | PRN

## 2020-01-04 MED ORDER — OMEPRAZOLE 20 MG PO CPDR: 20.0000 mg | DELAYED_RELEASE_CAPSULE | Freq: Every day | ORAL | 2 refills | Status: DC

## 2020-01-04 MED FILL — BENZONATATE 100 MG CAPS: 100 | 7 days supply | Qty: 14 | Fill #0

## 2020-01-04 MED FILL — ?OMEPRAZOLE 20 MG CAP DR: 20 | 30 days supply | Qty: 30 | Fill #0

## 2020-01-04 NOTE — Progress Notes (Signed)
Pt states her throat has been hurting for 2 weeks   Pt states she has tried taking otc medicine with no relief   Pt states she has been gargling with salt and warm water and has been using cough drops

## 2020-01-04 NOTE — Progress Notes (Signed)
Virtual Visit via Telephone Note  I connected with Sabrina Ortiz, on 01/04/2020 at 10:03 AM by telephone due to the COVID-19 pandemic and verified that I am speaking with the correct person using two identifiers.  Due to current restrictions/limitations of in-office visits due to the COVID-19 pandemic, this scheduled clinical appointment was converted to a telehealth visit.   Consent: I discussed the limitations, risks, security and privacy concerns of performing an evaluation and management service by telephone and the availability of in person appointments. I also discussed with the patient that there may be a patient responsible charge related to this service. The patient expressed understanding and agreed to proceed.  Location of Patient: Home  Location of Provider: [place]  Persons participating in Telemedicine visit: Laylamarie Meuser [place], NP Orlan Leavens, [place]  History of Present Illness: Sabrina Ortiz is a 49 year old female with history of essential hypertension, hypertensive emergency, systolic heart failure, acute on chronic left systolic heart failure, rhinitis, acute pulmonary edema, neuropathy, recurrent urticaria, herpes genitalis in women, generalized anxiety disorder, major depressive disorder recurrent severe with psychotic features, dyslipidemia, trichomonas infection, vitamin D deficiency, allergic reaction, angioedema, prediabetes, and chronic right shoulder pain who present for medication refill and sore throat.   1. GERD FOLLOW-UP:  [name] complains of heartburn. This has been associated with heartburn.  She denies chest pain and shortness of breath. Symptoms have been present for several years. She denies dysphagia.  She has not lost weight. She denies melena, hematochezia, hematemesis, and coffee ground emesis. Medical therapy in the past has included proton pump inhibitors.   2. SORE THROAT:  Patient complains of sore  throat. Associated symptoms include hoarseness, pain while swallowing, sore throat and neck tenderness, and cough with yellow phlegm.Onset of symptoms was 2 weeks ago, unchanged since that time. She is drinking plenty of fluids. She has not had recent close exposure to someone with proven streptococcal pharyngitis. Reports she had strep throat in the past and it feels very similar to what she is experiencing at the moment.Reports tried cough drops, mouthwash, salt water, warm rag over throat and none help for too long. Reports she had a Covid test done not too long ago which resulted negative.  Past Medical History:  Diagnosis Date  . Angio-edema   . Anxiety   . Chronic back pain   . COPD (chronic obstructive pulmonary disease) ([place])   . Depression   . Hypertension   . Mental disorder   . PTSD (post-traumatic stress disorder)   . Urticaria    Allergies  Allergen Reactions  . Shellfish Allergy Anaphylaxis    Current Outpatient Medications on File Prior to Visit  Medication Sig Dispense Refill  . aspirin EC 81 MG EC tablet Take 1 tablet (81 mg total) by mouth daily.    Marland Kitchen atorvastatin (LIPITOR) 40 MG tablet Take 1 tablet (40 mg total) by mouth daily at 6 PM. Or later/after evening meal 90 tablet 3  . carvedilol (COREG) 6.25 MG tablet Take 1 tablet (6.25 mg total) by mouth 2 (two) times daily with a meal. 180 tablet 3  . furosemide (LASIX) 20 MG tablet Take 1 tablet (20 mg total) by mouth daily. Must have office visit for refills 30 tablet 0  . omeprazole (PRILOSEC) 20 MG capsule TAKE 1 CAPSULE BY MOUTH DAILY. 30 capsule 3  . potassium chloride (K-DUR) 10 MEQ tablet TAKE 1 TABLET (10 MEQ TOTAL) BY MOUTH DAILY. 90 tablet 1  .  sacubitril-valsartan (ENTRESTO) 49-51 MG Take 1 tablet by mouth 2 (two) times daily. 60 tablet 11  . busPIRone (BUSPAR) 10 MG tablet Take 1 tablet (10 mg total) by mouth 3 (three) times daily. To treat anxiety (Patient not taking: Reported on 01/02/2020) 90 tablet 1  .  medroxyPROGESTERone (DEPO-PROVERA) 150 MG/ML injection Inject 1 mL (150 mg total) into the muscle every 3 (three) months. 1 mL 5  . Melatonin 3 MG CAPS Take 1 capsule (3 mg total) by mouth at bedtime as needed. (Patient taking differently: Take 3 mg by mouth at bedtime as needed (sleep). ) 30 capsule 11  . traMADol (ULTRAM) 50 MG tablet Take one to two tabs po q6-8 hours prn pain (Patient not taking: Reported on 01/02/2020) 30 tablet 2  . Vitamin D, Ergocalciferol, (DRISDOL) 1.25 MG (50000 UT) CAPS capsule Take 1 capsule (50,000 Units total) by mouth every 7 (seven) days. (Patient not taking: Reported on 07/31/2019) 16 capsule 0  . [DISCONTINUED] potassium chloride (K-DUR,KLOR-CON) 10 MEQ tablet Take 1 tablet (10 mEq total) by mouth daily. 30 tablet 3   No current facility-administered medications on file prior to visit.    Observations/Objective: Alert and oriented x 3. Not in acute distress. Physical examination not completed as this is a telemedicine visit.  Assessment and Plan: 1. Gastroesophageal reflux disease: -Refill Omeprazole for management of symptoms of GERD.  -Follow-up with primary physician as needed. - omeprazole (PRILOSEC) 20 MG capsule; Take 1 capsule (20 mg total) by mouth daily.  Dispense: 30 capsule; Refill: 2  2. Sore throat: -Throat culture today to test for possible streptococcal pharyngitis. Patient will have throat culture done at curbside. Patient is aware to call office front desk when she arrives and to wait in the car until a staff member is able to come outside. Patient agreeable. -Take Benzonatate capsules as needed for cough.  -Follow-up with primary physician as needed. - benzonatate (TESSALON) 100 MG capsule; Take 1 capsule (100 mg total) by mouth 2 (two) times daily as needed for up to 7 days for cough.  Dispense: 14 capsule; Refill: 0 - Upper Respiratory Culture, Routine  Follow Up Instructions: Follow-up with primary physician as needed.   Patient was  given clear instructions to go to Emergency Department or return to medical center if symptoms don't improve, worsen, or new problems develop.The patient verbalized understanding.  I discussed the assessment and treatment plan with the patient. The patient was provided an opportunity to ask questions and all were answered. The patient agreed with the plan and demonstrated an understanding of the instructions.   The patient was advised to call back or seek an in-person evaluation if the symptoms worsen or if the condition fails to improve as anticipated.   I provided 20 minutes total of non-face-to-face time during this encounter including median intraservice time, reviewing previous notes, labs, imaging, medications, management and patient verbalized understanding.    Rema Fendt, NP  [facility] [place], [place] [phone]   01/04/2020, 10:03 AM

## 2020-01-04 NOTE — Patient Instructions (Addendum)
Throat culture today. Take Tessalon Perles as needed for cough. Continue Omeprazole for acid reflux. Follow-up with primary physician as needed. Sore Throat When you have a sore throat, your throat may feel:  Tender.  Burning.  Irritated.  Scratchy.  Painful when you swallow.  Painful when you talk. Many things can cause a sore throat, such as:  An infection.  Allergies.  Dry air.  Smoke or pollution.  Radiation treatment.  Gastroesophageal reflux disease (GERD).  A tumor. A sore throat can be the first sign of another sickness. It can happen with other problems, like:  Coughing.  Sneezing.  Fever.  Swelling in the neck. Most sore throats go away without treatment. Follow these instructions at home:      Take over-the-counter medicines only as told by your doctor. ? If your child has a sore throat, do not give your child aspirin.  Drink enough fluids to keep your pee (urine) pale yellow.  Rest when you feel you need to.  To help with pain: ? Sip warm liquids, such as broth, herbal tea, or warm water. ? Eat or drink cold or frozen liquids, such as frozen ice pops. ? Gargle with a salt-water mixture 3-4 times a day or as needed. To make a salt-water mixture, add -1 tsp (3-6 g) of salt to 1 cup (237 mL) of warm water. Mix it until you cannot see the salt anymore. ? Suck on hard candy or throat lozenges. ? Put a cool-mist humidifier in your bedroom at night. ? Sit in the bathroom with the door closed for 5-10 minutes while you run hot water in the shower.  Do not use any products that contain nicotine or tobacco, such as cigarettes, e-cigarettes, and chewing tobacco. If you need help quitting, ask your doctor.  Wash your hands well and often with soap and water. If soap and water are not available, use hand sanitizer. Contact a doctor if:  You have a fever for more than 2-3 days.  You keep having symptoms for more than 2-3 days.  Your throat does not  get better in 7 days.  You have a fever and your symptoms suddenly get worse.  Your child who is 3 months to 14 years old has a temperature of 102.92F (39C) or higher. Get help right away if:  You have trouble breathing.  You cannot swallow fluids, soft foods, or your saliva.  You have swelling in your throat or neck that gets worse.  You keep feeling sick to your stomach (nauseous).  You keep throwing up (vomiting). Summary  A sore throat is pain, burning, irritation, or scratchiness in the throat. Many things can cause a sore throat.  Take over-the-counter medicines only as told by your doctor. Do not give your child aspirin.  Drink plenty of fluids, and rest as needed.  Contact a doctor if your symptoms get worse or your sore throat does not get better within 7 days. This information is not intended to replace advice given to you by your health care provider. Make sure you discuss any questions you have with your health care provider. Document Revised: 01/24/2018 Document Reviewed: 01/24/2018 Elsevier Patient Education  2020 [employer].

## 2020-01-07 LAB — CULTURE, GROUP A STREP: Strep A Culture: NEGATIVE

## 2020-01-07 LAB — SPECIMEN STATUS REPORT

## 2020-01-08 NOTE — BH Specialist Note (Signed)
Integrated Behavioral Health via Telemedicine Video Visit  01/08/2020 Sabrina Ortiz [id number]   Pt requests to reschedule, due to scheduling conflict; agrees to reschedule for a virtual visit with [facility] on 01/16/20 at 2:15pm.   Rae Lips

## 2020-01-09 ENCOUNTER — Other Ambulatory Visit: Payer: Self-pay

## 2020-01-09 ENCOUNTER — Ambulatory Visit (INDEPENDENT_AMBULATORY_CARE_PROVIDER_SITE_OTHER): Payer: Medicare Other | Admitting: Clinical

## 2020-01-09 DIAGNOSIS — F32A Depression, unspecified: Secondary | ICD-10-CM

## 2020-01-09 DIAGNOSIS — F329 Major depressive disorder, single episode, unspecified: Secondary | ICD-10-CM

## 2020-01-09 DIAGNOSIS — F419 Anxiety disorder, unspecified: Secondary | ICD-10-CM

## 2020-01-11 NOTE — Progress Notes (Signed)
Please call patient with update.   Patient does not have strep throat.  Patient reported during visit she had a Covid test recently, not affiliated with [facility], and that it was negative.  Follow-up with primary physician if symptoms persist. Seek medical attention immediately if symptoms worsen or become severe.

## 2020-01-12 ENCOUNTER — Telehealth: Payer: Self-pay

## 2020-01-12 NOTE — Telephone Encounter (Signed)
Contacted pt to go over lab results pt didn't answer left a detailed vm informing pt of results and if she has any questions or concerns to give [place] a call

## 2020-01-16 ENCOUNTER — Ambulatory Visit (INDEPENDENT_AMBULATORY_CARE_PROVIDER_SITE_OTHER): Payer: Medicare Other | Admitting: Clinical

## 2020-01-16 ENCOUNTER — Other Ambulatory Visit: Payer: Self-pay

## 2020-01-16 DIAGNOSIS — Z658 Other specified problems related to psychosocial circumstances: Secondary | ICD-10-CM

## 2020-01-16 DIAGNOSIS — F4321 Adjustment disorder with depressed mood: Secondary | ICD-10-CM

## 2020-01-16 DIAGNOSIS — F411 Generalized anxiety disorder: Secondary | ICD-10-CM | POA: Diagnosis not present

## 2020-01-16 NOTE — Patient Instructions (Signed)
[name], below is the information we talked about, including grief support (when you're ready), followed by substance treatment resources, housing resources, followed by [facility] information. Hope this is helpful for you and your family. Take good care of you this week, Sabrina Ortiz  Grief Support [facility] (Individual and group grief support)   [facility].Gerre Scull  [phone] ------------------------------------------------------------------------------------------------------------------------------------------------------------------   For crisis assistance, 24/7:   [facility] Mobile Crisis: [phone] [facility] Access to Care line:  [phone] [facility]: [address] [place], [phone] [facility] Recovery: [address] [place], [phone] [facility]: [facility], [phone]  Needle Exchange/Harm reduction:  [facility] [place]: [place], [place], [place] locations  *All locations, call [phone]  [address] [place], [place] 8-0 Mondays and Tuesdays 4pm-8pm Thursdays 1pm-8pm Fridays 4pm-6pm Sundays [facility]: [address] Wed 2-5, Thurs 3-8 [phone] [email or web address]  *All Services are Free Actor, Fentanyl test kits, access to Narcan and training, access to [employer] and methadone, case management, referrals, nurse counseling groups)                                                                    [place] ADS: Alcohol and Drug Services: [address] [phone] www.adyes.org  *Subsidized costs available   [facility]: [address] Agency [phone]-[phone], Tues & Thurs 9am-8pm, Wed & Fri 9am-4pm, [place] 9am-1pm  [phone] https://al-con-counseling-inc.business.site/   Technical sales engineer, PLLC: [address] [phone]/ Fax: [phone] http://www.amethystcares.com/index.html  [facility]: [address], [place], [place] 11/0 Walk-In Hours [phone]/ Helpline: [phone]/ Fax: [phone] InternetActor.at *Interpreters Available *Accepts Medicare, Medicaid, and uninsured patients   [facility]: [address], [place], [place] Mon-Fri 5am-10am, [place] 6am-8:30am, Audrea Muscat [phone]/ Fax: [phone]/ Methadone Clinic: [phone] https://www.crossroadstreatmentcenters.com/ *Accepts Medicaid   [facility]: [address], [place], [place]  [phone] http://www.simmons.org/   [facility] Overdose Loss Support Group Please call Burna Cash at [phone] for: Date, time, location, brief intake interview, & registration. https://www.[facility]ac[facility].org/event/overdose-loss-support-group-ongoing/2018-09-06/  [facility] Outpatient: [address], [place] [phone]/ Fax: [phone]   CarDumps.nl  [facility]: [address], [place], [place] [phone] http://www.kellinfoundation.org/support-groups.html   [facility]: [address] [place], [place], [place] YTR, [place], Fri: 9:00am-9:00pm; Ulanda Edison: 9:00am-6:00pm [phone]  https://ringercenter.[email or web address]/  *Habla Espanol   [facility]: [address] [place], [place] Mon-Thurs 9am-6pm, Fri 9am-2pm [phone]  http://triadbehavioralresources.com/ *Accepts Medicaid  [facility]: [address], [place], [place]  [phone] fax) [phone]  https://youthhavenservices.com/    *Accepts Medicaid  [facility]: [address] [place], [address], [place], [place]  [phone] / Fax: [phone]  https://zephaniahservices.com/index.html                                                    These referrals have been provided to you as appropriate for your clinical needs while taking into account your financial concerns. Please be aware that agencies, practitioners and insurance companies sometimes change contracts. When calling to make an appointment  have your insurance information available so the professional you are going to see can confirm whether they are covered by your plan. Take this form with you in case the person you are seeing needs a copy or to contact us.  Housing Resources                    [employer] (serves [place], [place], [place], [place], [place], [place], [place], [place], [place], [place], [place], [place], and [place] counties) [address], [place], [place] [address] [phone] DeveloperU.ch  **Rental assistance, Home Rehabilitation,Weatherization Assistance Program, Chief Financial Officer, Housing Voucher Program   Housing Resources [facility]  Housing Authority- [facility] [address], [place], [place] [address] [phone] www.gha-Samson.[facility] [address] [place], [place] [address] [phone] PhoneCaptions.ch **Programs include: Hospital doctor and Housing Counseling, Healthy Radiographer, therapeutic, Homeless Prevention and Housing Assistance  Government [facility] [address], [place], [place] [address] [phone] www.PaintingEmporium.co.za **housing applications/recertification; tax payment relief/exemption under specific qualifications  [facility] [address] [place], [place] [address] www.onlinegreensboro.com/~maryshouse **transitional housing for women in recovery who have minor children or are pregnant  [facility] [address] [place], [place], [place] [address] https://johnson-smith.net/  **emergency shelter and support services for families facing homelessness  [facility] [address] [place], [place] [address] [phone] www.youthfocus.org **transitional housing to pregnant women; emergency housing for youth who have run away, are experiencing a family crisis, are victims of abuse or neglect, or are homeless  [facility] [address] [place], [place] [address] [phone] ircgso.org **Drop-in center for people experiencing homelessness; overnight warming center when temperature is 25 degrees or below  [facility] [address] [place], [place], [place] [address] [phone] https://reentrystaffingagency.org/ **help with affordable housing to people experiencing homelessness or unemployment due to incarceration  [facility] [address], [place], [place] [address] [phone] www.greensborourbanministry.org  **emergency and transitional housing, rent/mortgage assistance, utility assistance  [facility] [address] [place], [place] [address] [phone] www.salvationarmyofgreensboro.org **emergency and transitional housing  [facility] for [employer] [address], [place], [place] [address] [phone] Www.habitatgreensboro.[facility]   [employer] [address], [place], [place] [address] [phone] https://chshousing.org **Home Ownership/Affordable Housing Program and [facility]  [facility]ousing [facility]ants Group [address], [place], [place] [address] [phone] PaidValue.com.cy **home buyer education courses, foreclosure prevention  [facility] [address], [place], [place] [address] [phone] DefMagazine.is **Environmental Exposure Assessment (investigation of homes where either children or pregnant women with a confirmed elevated blood lead level reside)  [facility] of Vocational  [facility] [address] [place], [place] [address] [phone] ShowReturn.ca **Home Expense Assistance/Repairs Program; offers home accessibility updates, such as ramps or bars in the bathroom  [facility] [address], [place], [place] [address] [phone] https://www.self-help.org/locations/[place]-branch **Offers credit-building and banking services to people unable to use traditional banking  [facility]  [address], [place], [place] [address] [phone] www.[place].com/fjc  [facility]:  [address], 2nd floor, [place], [place] [address] [phone])  Main line [phone]  [facility] location  [facility] [place] [facility]:  [address] [place], [place] [address] [phone]) Main line [phone]  [place] location   Walk-in hours: Monday -Friday 8:30am-4:30am  MarketCities.com.br   If immediate emergency,  call 9-1-1, or [facility] of the [place] 24 hour crisis hotline at: [phone]

## 2020-01-16 NOTE — BH Specialist Note (Signed)
Integrated Behavioral Health via Telemedicine Video Visit  01/16/2020 Keia Rask [id number]  Number of Integrated Behavioral Health visits: 1 Session Start time: 2:17  Session End time: 3:15 Total time: 34  Referring Provider: Nolene Bernheim, NP Type of Visit: Video Patient/Family location: Home [facility] Provider location: Center for [facility] Healthcare at [facility] for [facility]  All persons participating in visit: Patient Sabrina Ortiz and [facility] Talani Brazee    Confirmed patient's address: Yes  Confirmed patient's phone number: Yes  Any changes to demographics: No   Confirmed patient's insurance: Yes  Any changes to patient's insurance: No   Discussed confidentiality: Yes   I connected with Maryana Pittmon Balster  by a video enabled telemedicine application and verified that I am speaking with the correct person using two identifiers.     I discussed the limitations of evaluation and management by telemedicine and the availability of in person appointments.  I discussed that the purpose of this visit is to provide behavioral health care while limiting exposure to the novel coronavirus.   Discussed there is a possibility of technology failure and discussed alternative modes of communication if that failure occurs.  I discussed that engaging in this video visit, they consent to the provision of behavioral healthcare and the services will be billed under their insurance.  Patient and/or legal guardian expressed understanding and consented to video visit: Yes   PRESENTING CONCERNS: Patient and/or family reports the following symptoms/concerns: Pt states her primary concern today is feeling overwhelmed, feeling so stressed out and anxious that she is throwing up, heart racing, forgetting things easily; CPAP machine not working like it used to due to anxiety interfering with breathing, unable to sleep at night (fear of son leaving door unlocked or showing up); fear of coronovirus  as 16 family/friends have been positive.  Pt's goal is to have time to herself to process the loss of a cousin, aunts, and at least 2 friends in the past six months, and for 28yo son to stop coming to her home causing more stress.   Duration of problem: Increase in past year; Severity of problem: severe  STRENGTHS (Protective Factors/Coping Skills): Resilience; self-awareness; Open to treatment  GOALS ADDRESSED: Patient will: 1.  Reduce symptoms of: anxiety, depression and stress  2.  Increase knowledge and/or ability of: stress reduction  3.  Demonstrate ability to: Increase healthy adjustment to current life circumstances, Increase adequate support systems for patient/family and Improve medication compliance  INTERVENTIONS: Interventions utilized:  Solution-Focused Strategies and Psychoeducation and/or Health Education Standardized Assessments completed: PHQ9/GAD7 taken in past two weeks  ASSESSMENT: Patient currently experiencing Grief,  Generalized anxiety disorder, and Psychosocial stress  Patient may benefit from psychoeducation and brief therapeutic interventions regarding coping with symptoms of anxiety and depression .  PLAN: 1. Follow up with behavioral health clinician on : Two weeks 2. Behavioral recommendations:  -Continue 10 minutes of physical activity 3x/day, as recommended by medical provider(s) -Add breathing exercise after each 10 minutes of physical activity daily -Consider community resources (on After Visit Summary) to share with son, as needed -Continue weekly warm bath; consider increasing to twice weekly for extra self-care/time to self -Consider grief counseling if needed in the future (on After Visit Summary) 3. Referral(s): Integrated Art gallery manager (In Clinic) and [employer] Resources:  Grief counseling  I discussed the assessment and treatment plan with the patient and/or parent/guardian. They were provided an opportunity to ask questions and  all were answered. They agreed with the plan  and demonstrated an understanding of the instructions.   They were advised to call back or seek an in-person evaluation if the symptoms worsen or if the condition fails to improve as anticipated.  Caroleen Hamman [facility]  Depression screen [facility] 2/9 01/02/2020 05/17/2019 11/14/2018 11/11/2018 09/30/2018  Decreased Interest 3 0 3 3 3   Down, Depressed, Hopeless 2 0 2 2 2   PHQ - 2 Score 5 0 5 5 5   Altered sleeping 3 - 2 2 3   Tired, decreased energy 2 - 2 2 2   Change in appetite 1 - 2 3 2   Feeling bad or failure about yourself  0 - 1 2 2   Trouble concentrating 1 - 1 3 2   Moving slowly or fidgety/restless 1 - 1 2 2   Suicidal thoughts 0 - 0 1 0  PHQ-9 Score 13 - 14 20 18   Some recent data might be hidden   GAD 7 : Generalized Anxiety Score 01/02/2020 05/17/2019 11/14/2018 11/11/2018  Nervous, Anxious, on Edge 3 0 2 3  Control/stop worrying 2 0 2 3  Worry too much - different things 2 0 2 3  Trouble relaxing 2 0 2 2  Restless 1 0 1 2  Easily annoyed or irritable 3 0 2 3  Afraid - awful might happen 0 0 1 2  Total GAD 7 Score 13 0 12 18

## 2020-01-17 ENCOUNTER — Other Ambulatory Visit: Payer: Self-pay | Admitting: Family Medicine

## 2020-01-17 DIAGNOSIS — I5022 Chronic systolic (congestive) heart failure: Secondary | ICD-10-CM

## 2020-01-25 NOTE — BH Specialist Note (Signed)
Integrated Behavioral Health via Telemedicine Video Visit  01/25/2020 Caylie Sandquist [id number]  Number of Integrated Behavioral Health visits: 3 Session Start time: 3:18  Session End time: 3:45 Total time: 27  Referring Provider: Earlie Server, NP Type of Visit: Video Patient/Family location: Home [facility] Provider location: Center for [place] [facility]  All persons participating in visit: Patient Sabrina Ortiz and [place]    Confirmed patient's address: Yes  Confirmed patient's phone number: Yes  Any changes to demographics: No   Confirmed patient's insurance: Yes  Any changes to patient's insurance: No   Discussed confidentiality: at previous visit  I connected with Sadie Haber Wordell  by a video enabled telemedicine application and verified that I am speaking with the correct person using two identifiers.     I discussed the limitations of evaluation and management by telemedicine and the availability of in person appointments.  I discussed that the purpose of this visit is to provide behavioral health care while limiting exposure to the novel coronavirus.   Discussed there is a possibility of technology failure and discussed alternative modes of communication if that failure occurs.  I discussed that engaging in this video visit, they consent to the provision of behavioral healthcare and the services will be billed under their insurance.  Patient and/or legal guardian expressed understanding and consented to video visit: Yes   PRESENTING CONCERNS: Patient and/or family reports the following symptoms/concerns: Pt states her primary concern today is needing to talk through feelings after seeing disability psychiatrist, who "brought up stuff in the past", triggering nightmares the past few nights. Pt agrees to referral to psychiatry for [facility] medication management and requests resources sent via MyChart, rather than AVS.  Duration of  problem: Increase in over one year; Severity of problem: moderate  STRENGTHS (Protective Factors/Coping Skills): Resilience, self-awareness  GOALS ADDRESSED: Patient will: 1.  Reduce symptoms of: anxiety, depression and stress  2.  Increase knowledge and/or ability of: stress reduction  3.  Demonstrate ability to: Increase healthy adjustment to current life circumstances and Continue healthy grieving over loss  INTERVENTIONS: Interventions utilized:  Solution-Focused Strategies Standardized Assessments completed: PHQ9/GAD7 taken in past two weeks  ASSESSMENT: Patient currently experiencing Grief, Generalized anxiety disorder, and Psychosocial stress.   Patient may benefit from continued psychoeducation and brief therapeutic interventions regarding coping with symptoms of anxiety, grief, and life stress .  PLAN: 1. Follow up with behavioral health clinician on : As needed. [facility] will call in two weeks 2. Behavioral recommendations:  -Accept referral to psychiatry(return unanswered calls or set up voicemail to receive call) -Consider setting down very firm boundaries (with consequences) for son, and follow through with consequences every time -Consider 10 minutes of physical activity 3x/day, as recommended by medical providers -Consider adding breathing exercise after each 10 minutes of physical activity daily -Consider community resources to share with son (sent to MyChart) -Continue weekly warm bath; consider increasing to twice weekly  -Consider grief counseling, as discussed -Consider establishing care with ongoing therapist, as discussed   3. Referral(s): [place] (In Clinic), [place] (LME/Outside Clinic) and Community Resources:  substance help for son  I discussed the assessment and treatment plan with the patient and/or parent/guardian. They were provided an opportunity to ask questions and all were answered. They agreed  with the plan and demonstrated an understanding of the instructions.   They were advised to call back or seek an in-person evaluation if  the symptoms worsen or if the condition fails to improve as anticipated.  Valetta Close Marvelous Bouwens

## 2020-01-26 ENCOUNTER — Other Ambulatory Visit: Payer: Self-pay

## 2020-01-26 ENCOUNTER — Ambulatory Visit: Payer: Medicare Other | Attending: Family Medicine

## 2020-01-26 ENCOUNTER — Other Ambulatory Visit: Payer: Self-pay | Admitting: Family Medicine

## 2020-01-26 ENCOUNTER — Telehealth: Payer: Self-pay

## 2020-01-26 DIAGNOSIS — R7303 Prediabetes: Secondary | ICD-10-CM | POA: Diagnosis not present

## 2020-01-26 DIAGNOSIS — E785 Hyperlipidemia, unspecified: Secondary | ICD-10-CM

## 2020-01-26 DIAGNOSIS — E559 Vitamin D deficiency, unspecified: Secondary | ICD-10-CM | POA: Diagnosis not present

## 2020-01-26 DIAGNOSIS — I5022 Chronic systolic (congestive) heart failure: Secondary | ICD-10-CM | POA: Diagnosis not present

## 2020-01-26 DIAGNOSIS — Z79899 Other long term (current) drug therapy: Secondary | ICD-10-CM | POA: Diagnosis not present

## 2020-01-26 NOTE — Telephone Encounter (Signed)
Patient came into the clinic for blood work and is requesting a refill for furosemide (LASIX) 20 MG tablet   Patient uses [facility]

## 2020-01-26 NOTE — Progress Notes (Signed)
Patient ID: Sabrina Ortiz, female   DOB: 01-22-1971, 49 y.o.   MRN: [id number]   I have not seen patient since last year but she is here today for a lab visit and orders are needed therefore I will place orders based on her chronic medical issues and she needs follow-up office visit.

## 2020-01-27 LAB — LIPID PANEL
Chol/HDL Ratio: 5.1 ratio — ABNORMAL HIGH (ref 0.0–4.4)
Cholesterol, Total: 297 mg/dL — ABNORMAL HIGH (ref 100–199)
HDL: 58 mg/dL
LDL Chol Calc (NIH): 215 mg/dL — ABNORMAL HIGH (ref 0–99)
Triglycerides: 132 mg/dL (ref 0–149)
VLDL Cholesterol Cal: 24 mg/dL (ref 5–40)

## 2020-01-27 LAB — BASIC METABOLIC PANEL WITH GFR
BUN/Creatinine Ratio: 7 — ABNORMAL LOW (ref 9–23)
BUN: 7 mg/dL (ref 6–24)
CO2: 21 mmol/L (ref 20–29)
Calcium: 9.5 mg/dL (ref 8.7–10.2)
Chloride: 104 mmol/L (ref 96–106)
Creatinine, Ser: 1.05 mg/dL — ABNORMAL HIGH (ref 0.57–1.00)
GFR calc Af Amer: 73 mL/min/1.73
GFR calc non Af Amer: 63 mL/min/1.73
Glucose: 221 mg/dL — ABNORMAL HIGH (ref 65–99)
Potassium: 4 mmol/L (ref 3.5–5.2)
Sodium: 140 mmol/L (ref 134–144)

## 2020-01-27 LAB — MAGNESIUM: Magnesium: 2 mg/dL (ref 1.6–2.3)

## 2020-01-27 LAB — HEPATIC FUNCTION PANEL
ALT: 62 IU/L — ABNORMAL HIGH (ref 0–32)
AST: 49 IU/L — ABNORMAL HIGH (ref 0–40)
Albumin: 4.6 g/dL (ref 3.8–4.8)
Alkaline Phosphatase: 64 IU/L (ref 48–121)
Bilirubin Total: 0.5 mg/dL (ref 0.0–1.2)
Bilirubin, Direct: 0.15 mg/dL (ref 0.00–0.40)
Total Protein: 7.2 g/dL (ref 6.0–8.5)

## 2020-01-27 LAB — HEMOGLOBIN A1C
Est. average glucose Bld gHb Est-mCnc: 171 mg/dL
Hgb A1c MFr Bld: 7.6 % — ABNORMAL HIGH (ref 4.8–5.6)

## 2020-01-27 LAB — VITAMIN D 25 HYDROXY (VIT D DEFICIENCY, FRACTURES): Vit D, 25-Hydroxy: 22.9 ng/mL — ABNORMAL LOW (ref 30.0–100.0)

## 2020-01-28 ENCOUNTER — Other Ambulatory Visit: Payer: Self-pay | Admitting: Family Medicine

## 2020-01-28 DIAGNOSIS — I5022 Chronic systolic (congestive) heart failure: Secondary | ICD-10-CM

## 2020-01-28 MED ORDER — FUROSEMIDE 20 MG PO TABS: 20.0000 mg | ORAL_TABLET | Freq: Every day | ORAL | 0 refills | Status: DC

## 2020-01-28 NOTE — Telephone Encounter (Signed)
Refill of Lasix was sent to [facility] pharmacy and patient needs an appointment if not already scheduled

## 2020-01-28 NOTE — Progress Notes (Signed)
Patient ID: Sabrina Ortiz, female   DOB: 28-Dec-1970, 49 y.o.   MRN: [id number]   Patient left message requesting refill of Lasix and 30-day supply will be sent to pharmacy as patient has not had recent follow-up with PCP.

## 2020-01-29 IMAGING — MG DIGITAL SCREENING BILATERAL MAMMOGRAM WITH TOMO AND CAD
3 series · 3 of 11 positions shown · non-contrast
Comparison: Previous exam(s).

CLINICAL DATA: Screening.

EXAM:
DIGITAL SCREENING BILATERAL MAMMOGRAM WITH TOMO AND CAD

[R MLO synth-2D]
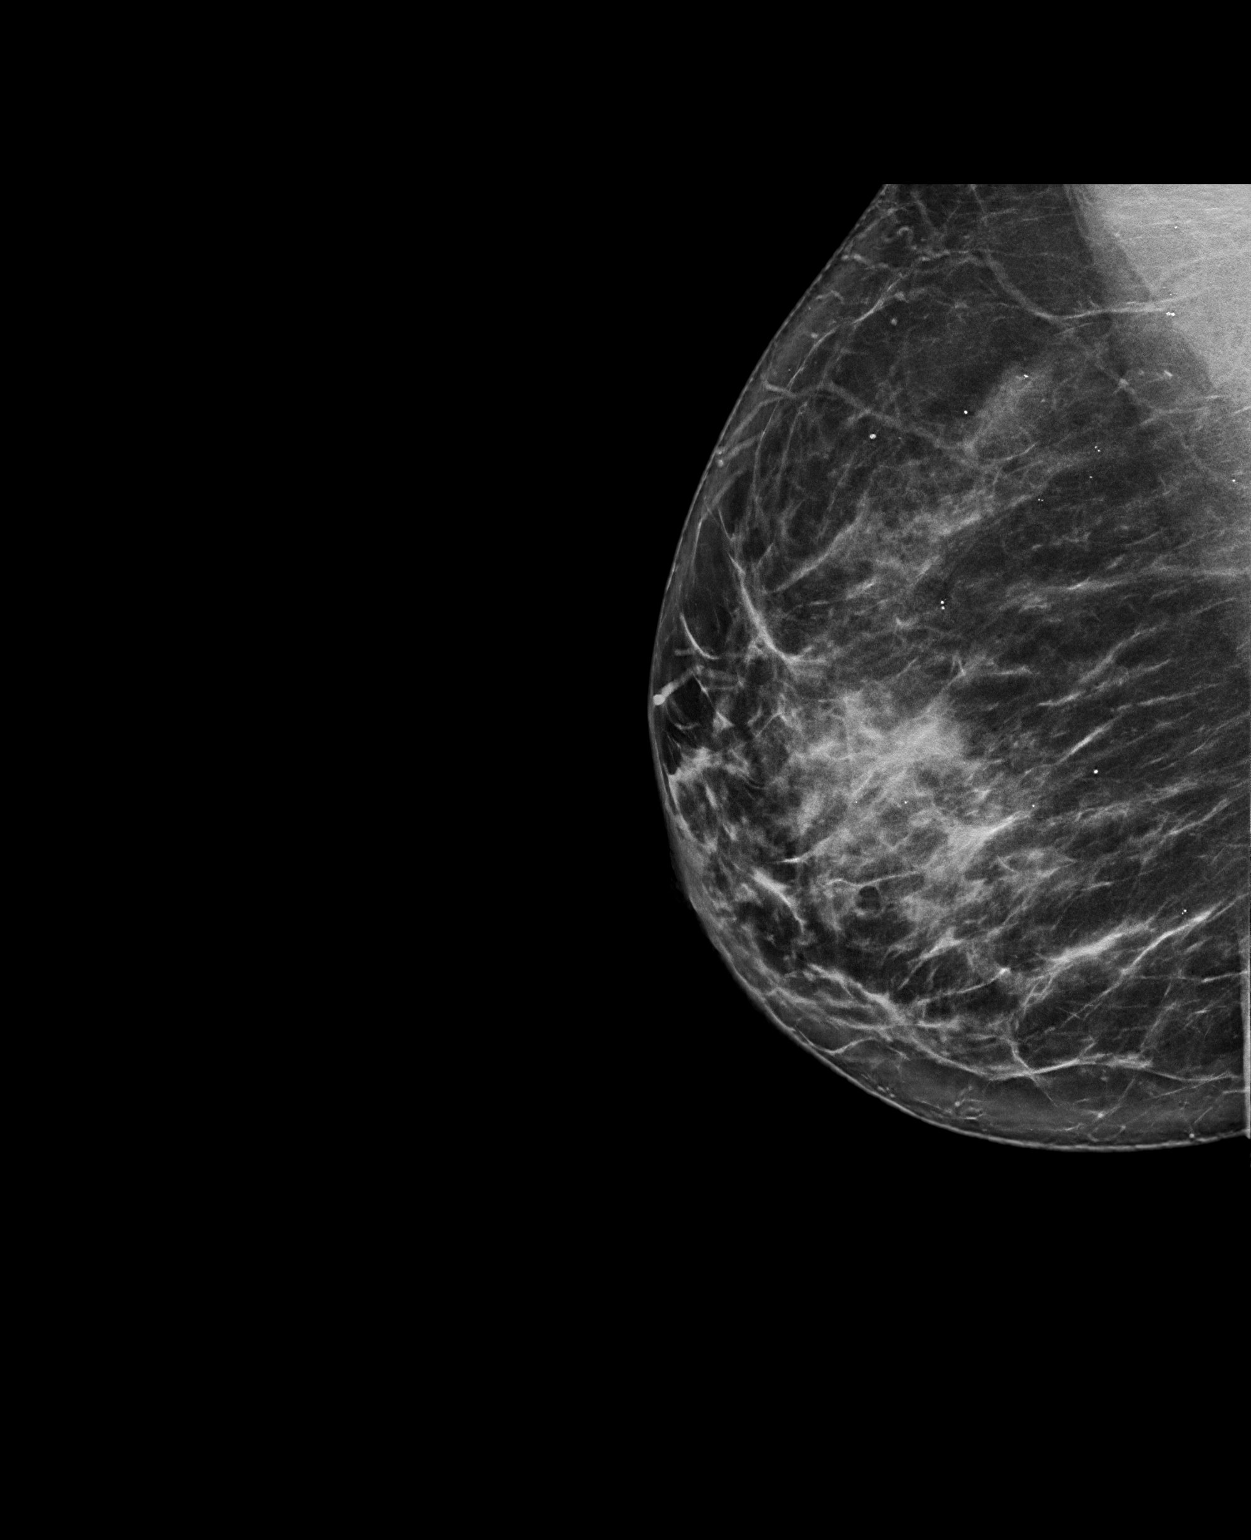

[L MLO tomo · tomo slice 40/79.0]
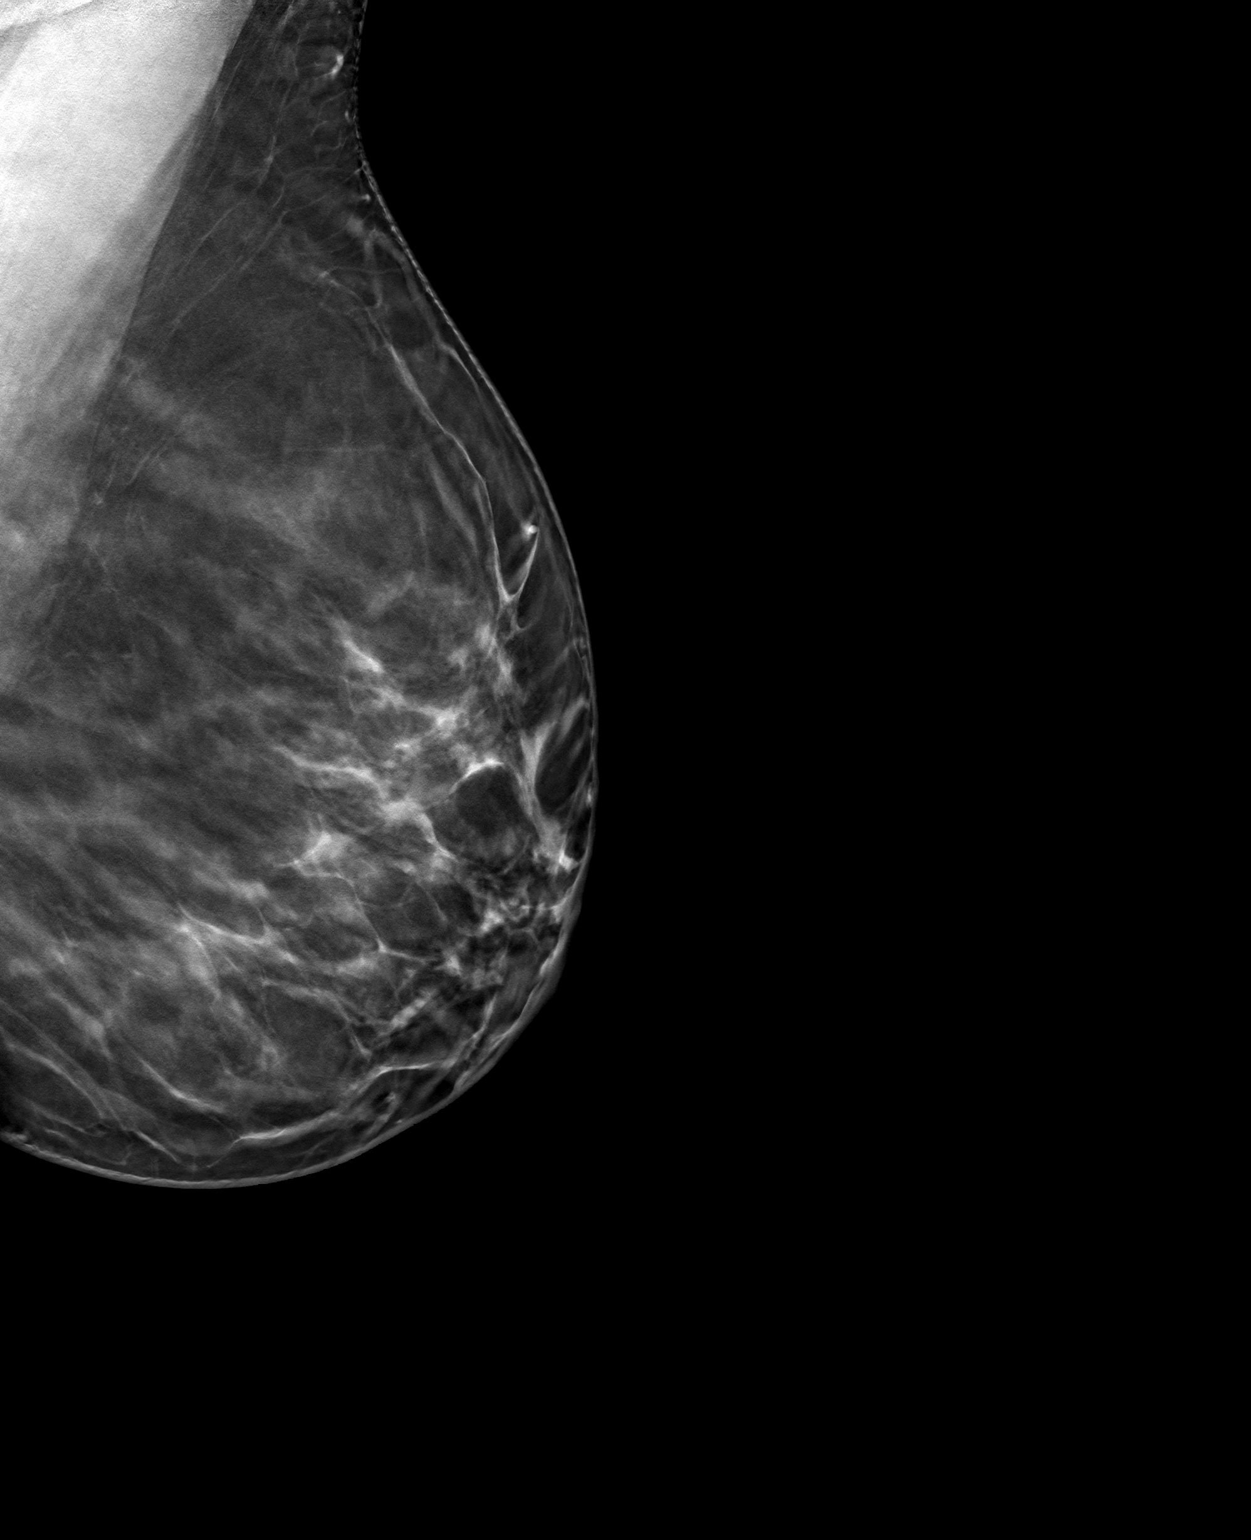

[R MLO tomo · tomo slice 39/78.0]
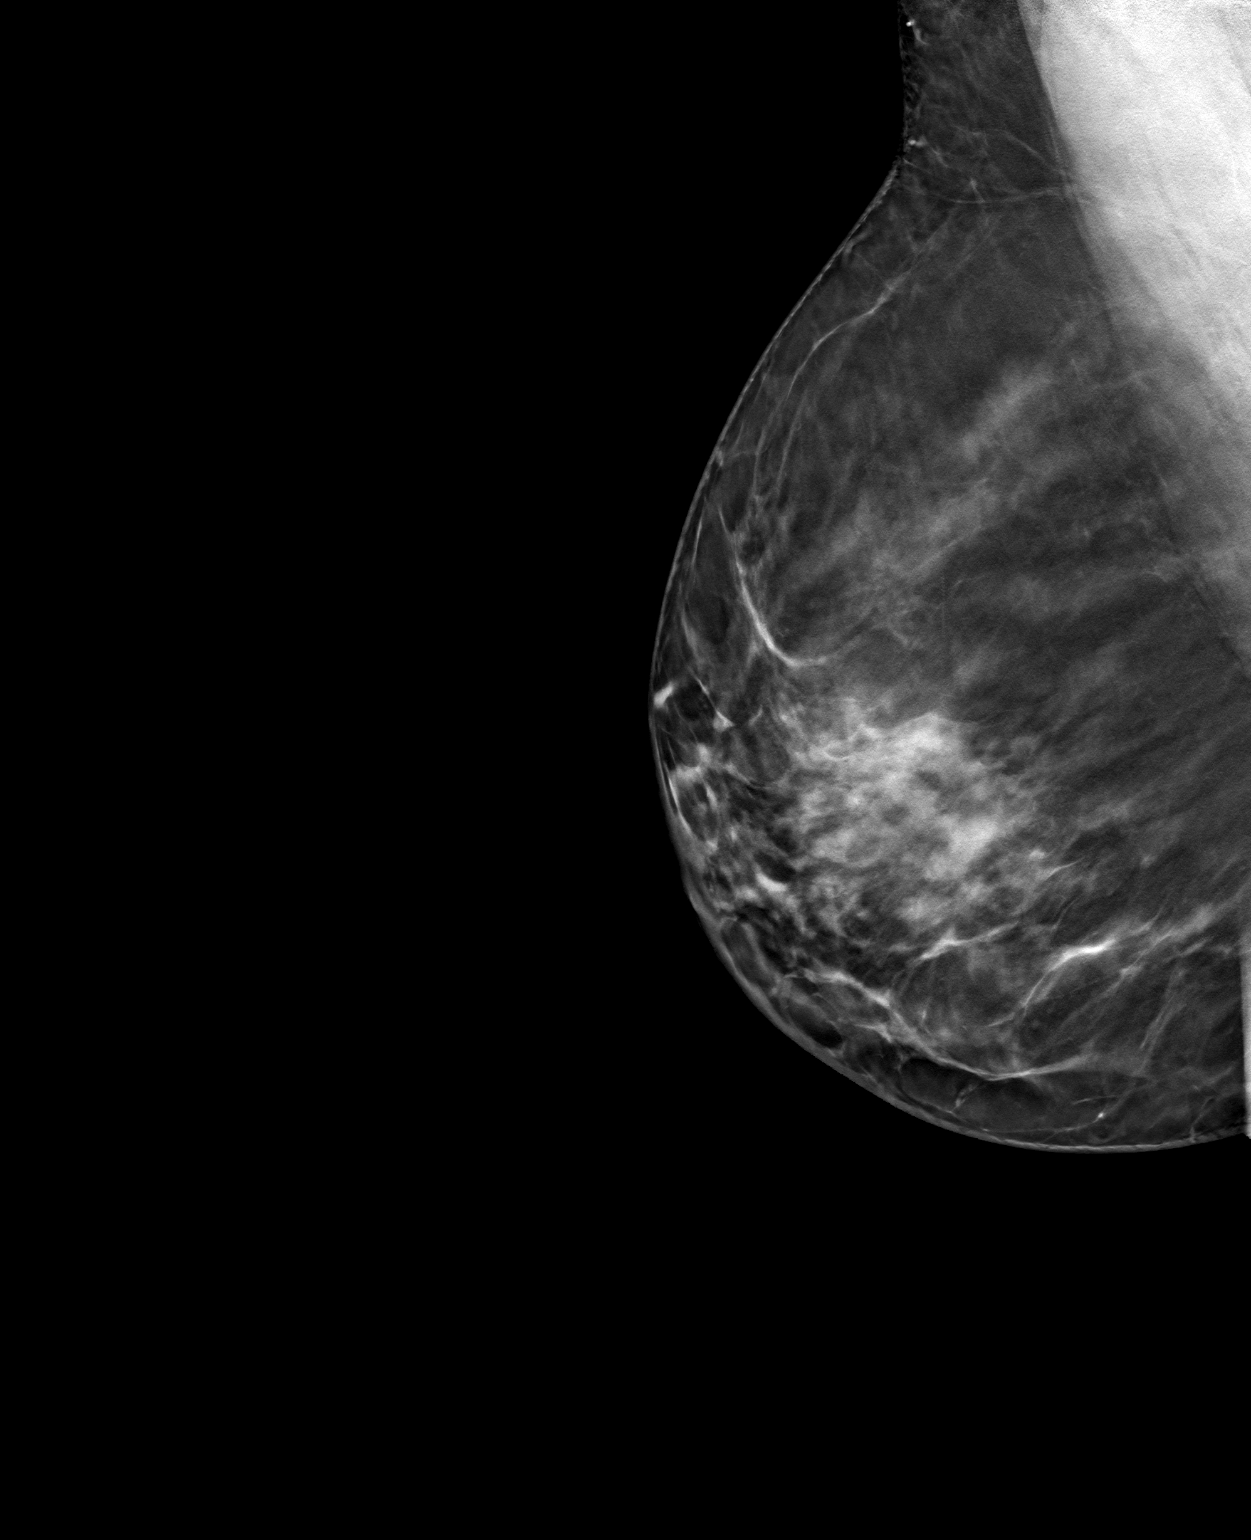

[3 of 11 positions shown; findings below may reference images not displayed]

ACR Breast Density Category b: There are scattered areas of
fibroglandular density.
FINDINGS: There are no findings suspicious for malignancy. Images were
processed with CAD.
IMPRESSION: No mammographic evidence of malignancy. A result letter of this
screening mammogram will be mailed directly to the patient.

RECOMMENDATION:
Screening mammogram in one year. (Code:[id number])

BI-RADS CATEGORY  1: Negative.

## 2020-01-29 MED FILL — FUROSEMIDE 20 MG TABS: 20 | 30 days supply | Qty: 30 | Fill #0

## 2020-01-30 ENCOUNTER — Other Ambulatory Visit: Payer: Self-pay | Admitting: Family Medicine

## 2020-01-30 DIAGNOSIS — I5022 Chronic systolic (congestive) heart failure: Secondary | ICD-10-CM

## 2020-01-30 DIAGNOSIS — E7800 Pure hypercholesterolemia, unspecified: Secondary | ICD-10-CM

## 2020-01-30 DIAGNOSIS — E1165 Type 2 diabetes mellitus with hyperglycemia: Secondary | ICD-10-CM

## 2020-01-31 ENCOUNTER — Other Ambulatory Visit: Payer: Self-pay | Admitting: Family Medicine

## 2020-01-31 ENCOUNTER — Other Ambulatory Visit: Payer: Self-pay

## 2020-01-31 ENCOUNTER — Ambulatory Visit (INDEPENDENT_AMBULATORY_CARE_PROVIDER_SITE_OTHER): Payer: Medicare Other | Admitting: Clinical

## 2020-01-31 DIAGNOSIS — F411 Generalized anxiety disorder: Secondary | ICD-10-CM

## 2020-01-31 DIAGNOSIS — I5022 Chronic systolic (congestive) heart failure: Secondary | ICD-10-CM

## 2020-01-31 MED FILL — POTASSIUM CHLORIDE ER 10 ME: 10 | 30 days supply | Qty: 30 | Fill #0

## 2020-02-12 ENCOUNTER — Ambulatory Visit: Payer: Medicare Other | Admitting: Family Medicine

## 2020-02-16 ENCOUNTER — Ambulatory Visit: Payer: Medicare Other | Admitting: Family Medicine

## 2020-03-15 ENCOUNTER — Other Ambulatory Visit: Payer: Self-pay | Admitting: Family Medicine

## 2020-03-15 DIAGNOSIS — I5022 Chronic systolic (congestive) heart failure: Secondary | ICD-10-CM

## 2020-03-15 NOTE — Telephone Encounter (Signed)
Requested medication (s) are due for refill today: Yes  Requested medication (s) are on the active medication list: Yes  Last refill:  01/28/20  Future visit scheduled: No  Notes to clinic:  Courtesy refill given already. Left message for pt. To call and make appointment.    Requested Prescriptions  Pending Prescriptions Disp Refills   furosemide (LASIX) 20 MG tablet [Pharmacy Med Name: FUROSEMIDE 20 MG TABS 20 Tablet] 30 tablet 0    Sig: Take 1 tablet (20 mg total) by mouth daily. Must have office visit for refills      Cardiovascular:  Diuretics - Loop Failed - 03/15/2020  2:55 PM      Failed - Cr in normal range and within 360 days    Creat  Date Value Ref Range Status  12/05/2015 1.18 (H) 0.50 - 1.10 mg/dL Final   Creatinine, Ser  Date Value Ref Range Status  01/26/2020 1.05 (H) 0.57 - 1.00 mg/dL Final          Passed - K in normal range and within 360 days    Potassium  Date Value Ref Range Status  01/26/2020 4.0 3.5 - 5.2 mmol/L Final          Passed - Ca in normal range and within 360 days    Calcium  Date Value Ref Range Status  01/26/2020 9.5 8.7 - 10.2 mg/dL Final          Passed - Na in normal range and within 360 days    Sodium  Date Value Ref Range Status  01/26/2020 140 134 - 144 mmol/L Final          Passed - Last BP in normal range    BP Readings from Last 1 Encounters:  01/02/20 128/88          Passed - Valid encounter within last 6 months    Recent Outpatient Visits           2 months ago Sore throat   [facility] [place], [place], NP   10 months ago Essential hypertension   [facility] [place], [place], MD   1 year ago Essential hypertension   [facility] [place], [place], MD   1 year ago Chronic systolic heart failure (HCC)   [facility] [place], [place], MD   1 year ago OSA on CPAP   [facility]  [place], [place], MD                potassium chloride (KLOR-CON) 10 MEQ tablet [Pharmacy Med Name: POTASSIUM CHLORIDE ER 10 ME 10 Tablet] 30 tablet 0    Sig: TAKE 1 TABLET (10 MEQ TOTAL) BY MOUTH DAILY.      Endocrinology:  Minerals - Potassium Supplementation Failed - 03/15/2020  2:55 PM      Failed - Cr in normal range and within 360 days    Creat  Date Value Ref Range Status  12/05/2015 1.18 (H) 0.50 - 1.10 mg/dL Final   Creatinine, Ser  Date Value Ref Range Status  01/26/2020 1.05 (H) 0.57 - 1.00 mg/dL Final          Passed - K in normal range and within 360 days    Potassium  Date Value Ref Range Status  01/26/2020 4.0 3.5 - 5.2 mmol/L Final          Passed - Valid encounter within last  12 months    Recent Outpatient Visits           2 months ago Sore throat   [facility] [place], [place], NP   10 months ago Essential hypertension   [facility] [place], [place], MD   1 year ago Essential hypertension   [facility] [place], [place], MD   1 year ago Chronic systolic heart failure [facility])   [facility] [place], [place], MD   1 year ago OSA on CPAP   [facility] [place], [place], MD

## 2020-03-19 ENCOUNTER — Ambulatory Visit: Payer: Self-pay | Admitting: Family Medicine

## 2020-03-19 ENCOUNTER — Ambulatory Visit (INDEPENDENT_AMBULATORY_CARE_PROVIDER_SITE_OTHER): Payer: Medicare Other | Admitting: *Deleted

## 2020-03-19 ENCOUNTER — Other Ambulatory Visit: Payer: Self-pay

## 2020-03-19 VITALS — BP 142/97 | HR 98 | Ht 65.0 in | Wt 181.8 lb

## 2020-03-19 DIAGNOSIS — Z3042 Encounter for surveillance of injectable contraceptive: Secondary | ICD-10-CM | POA: Diagnosis not present

## 2020-03-19 MED ORDER — MEDROXYPROGESTERONE ACETATE 150 MG/ML IM SUSP
150.0000 mg | Freq: Once | INTRAMUSCULAR | Status: AC
  Administered 2020-03-19: 150 mg via INTRAMUSCULAR

## 2020-03-19 NOTE — Progress Notes (Signed)
Pt here for scheduled Depo Provera injection. Pt had waited for about 45 minutes in lobby and was quite upset when I called her back. I apologized for the long wait. She declined to complete medication reconciliation with me and stated, "I'm about to lose it. I just need to go."  Depo Provera 150 mg IM administered as scheduled. Pt tolerated well. Next injection due 9/28-10/12. Because pt was upset and did not want to answer questions or remain in office any longer, I was not able to address the scores on the PHQ-9 and GAD-7 questionnaire with her.

## 2020-03-19 NOTE — Telephone Encounter (Signed)
Pt reports at [facility] appt this AM   BP was 142/97.  States takes infrequently at home with home monitor, has been running "Around 128/89 or 90." Reports has missed "Three or four doses of Coreg over past 2 weeks." Did take this AM prior to appt..Also reports ran out of Lasix and my potassium a while ago."   Reports headaches "Not daily but often" 8/10 when occurs; does not take anything for pain.  Reports occasional SOB with exertion.  Pt has only seem Dr. Jillyn Hidden at practice, last OV 05/17/2019. Care advise given per protocol, pt verbalizes understanding.  Please advise: [phone]  Reason for Disposition . [1] Systolic BP  >= 130 OR Diastolic >= 80 AND [2] taking BP medications  Answer Assessment - Initial Assessment Questions 1. BLOOD PRESSURE: "What is the blood pressure?" "Did you take at least two measurements 5 minutes apart?"     142/97 2. ONSET: "When did you take your blood pressure?"     At clinic today 3. HOW: "How did you obtain the blood pressure?" (e.g., visiting nurse, automatic home BP monitor)     Clinic 4. HISTORY: "Do you have a history of high blood pressure?"     yes 5. MEDICATIONS: "Are you taking any medications for blood pressure?" "Have you missed any doses recently?"     Yes. 3-4 doses over 2 weeks 6. OTHER SYMPTOMS: "Do you have any symptoms?" (e.g., headache, chest pain, blurred vision, difficulty breathing, weakness)     SOB "A few times"  Headache not daily. 8/10 7. PREGNANCY: "Is there any chance you are pregnant?" "When was your last menstrual period?"    no  Protocols used: BLOOD PRESSURE - HIGH-A-AH

## 2020-03-20 NOTE — Progress Notes (Signed)
Chart reviewed for nurse visit. Agree with plan of care.   Joan Avetisyan Lorraine, CNM 03/20/2020 9:02 PM   

## 2020-04-05 ENCOUNTER — Other Ambulatory Visit: Payer: Self-pay | Admitting: Family Medicine

## 2020-04-05 DIAGNOSIS — K219 Gastro-esophageal reflux disease without esophagitis: Secondary | ICD-10-CM

## 2020-04-05 DIAGNOSIS — I5022 Chronic systolic (congestive) heart failure: Secondary | ICD-10-CM

## 2020-04-05 NOTE — Telephone Encounter (Signed)
Requested medication (s) are due for refill today: yes  Requested medication (s) are on the active medication list: yes  Last refill:  01/28/2020  Future visit scheduled: no  Notes to clinic:  patient has not schedule a follow up    Requested Prescriptions  Pending Prescriptions Disp Refills   furosemide (LASIX) 20 MG tablet 30 tablet 0    Sig: Take 1 tablet (20 mg total) by mouth daily. Must have office visit for refills      Cardiovascular:  Diuretics - Loop Failed - 04/05/2020  2:19 PM      Failed - Cr in normal range and within 360 days    Creat  Date Value Ref Range Status  12/05/2015 1.18 (H) 0.50 - 1.10 mg/dL Final   Creatinine, Ser  Date Value Ref Range Status  01/26/2020 1.05 (H) 0.57 - 1.00 mg/dL Final          Failed - Last BP in normal range    BP Readings from Last 1 Encounters:  03/19/20 (!) 142/97          Passed - K in normal range and within 360 days    Potassium  Date Value Ref Range Status  01/26/2020 4.0 3.5 - 5.2 mmol/L Final          Passed - Ca in normal range and within 360 days    Calcium  Date Value Ref Range Status  01/26/2020 9.5 8.7 - 10.2 mg/dL Final          Passed - Na in normal range and within 360 days    Sodium  Date Value Ref Range Status  01/26/2020 140 134 - 144 mmol/L Final          Passed - Valid encounter within last 6 months    Recent Outpatient Visits           3 months ago Sore throat   [facility] [place], [place], NP   10 months ago Essential hypertension   [facility] [place], [place], MD   1 year ago Essential hypertension   [facility] [place], [place], MD   1 year ago Chronic systolic heart failure (HCC)   [facility] [place], [place], MD   1 year ago OSA on CPAP   [facility] [place], [place], MD                omeprazole (PRILOSEC) 20 MG capsule 30 capsule 2     Sig: Take 1 capsule (20 mg total) by mouth daily.      Gastroenterology: Proton Pump Inhibitors Passed - 04/05/2020  2:19 PM      Passed - Valid encounter within last 12 months    Recent Outpatient Visits           3 months ago Sore throat   [facility] [place], [place], NP   10 months ago Essential hypertension   [facility] [place], [place], MD   1 year ago Essential hypertension   [facility] [place], [place], MD   1 year ago Chronic systolic heart failure [facility])   [facility] [place], [place], MD   1 year ago OSA on CPAP    [facility] [place], [place], MD  potassium chloride (KLOR-CON) 10 MEQ tablet 30 tablet 0      Endocrinology:  Minerals - Potassium Supplementation Failed - 04/05/2020  2:19 PM      Failed - Cr in normal range and within 360 days    Creat  Date Value Ref Range Status  12/05/2015 1.18 (H) 0.50 - 1.10 mg/dL Final   Creatinine, Ser  Date Value Ref Range Status  01/26/2020 1.05 (H) 0.57 - 1.00 mg/dL Final          Passed - K in normal range and within 360 days    Potassium  Date Value Ref Range Status  01/26/2020 4.0 3.5 - 5.2 mmol/L Final          Passed - Valid encounter within last 12 months    Recent Outpatient Visits           3 months ago Sore throat   [facility] [place], [place], NP   10 months ago Essential hypertension   [facility] [place], [place], MD   1 year ago Essential hypertension   [facility] [place], [place], MD   1 year ago Chronic systolic heart failure [facility])   [facility] [place], [place], MD   1 year ago OSA on CPAP    [facility] [place], [place], MD

## 2020-04-05 NOTE — Telephone Encounter (Signed)
furosemide (LASIX) 20 MG tablet  omeprazole (PRILOSEC) 20 MG capsule potassium chloride (KLOR-CON) 10 MEQ tablet    Patient is requesting refill.    Pharmacy: [facility] - [place], [place] - [place] [address]  [address], [place] [address]  Phone:  [phone] Fax:  [phone]

## 2020-04-08 ENCOUNTER — Other Ambulatory Visit: Payer: Self-pay | Admitting: Family Medicine

## 2020-04-08 DIAGNOSIS — I5022 Chronic systolic (congestive) heart failure: Secondary | ICD-10-CM

## 2020-04-08 MED ORDER — POTASSIUM CHLORIDE ER 10 MEQ PO TBCR: EXTENDED_RELEASE_TABLET | ORAL | 0 refills | Status: DC

## 2020-04-08 MED ORDER — FUROSEMIDE 20 MG PO TABS: 20.0000 mg | ORAL_TABLET | Freq: Every day | ORAL | 0 refills | Status: DC

## 2020-04-08 NOTE — Telephone Encounter (Signed)
Medication Refill - Medication: furosemide (LASIX) 20 MG tablet,potassium chloride (KLOR-CON) 10 MEQ tablet (Patient stated that she has been without her Lasix for 3 weeks and just ran out of her potassium today. Patient is requesting a mychart message be sent to her once medication has been sent to pharmacy. )    Has the patient contacted their pharmacy? yes (Agent: If no, request that the patient contact the pharmacy for the refill.) (Agent: If yes, when and what did the pharmacy advise?)Contact PCP  Preferred Pharmacy (with phone number or street name):  [facility] [place], [place] - [place] [address] Phone:  [phone]  Fax:  [phone]       Agent: Please be advised that RX refills may take up to 3 business days. We ask that you follow-up with your pharmacy.

## 2020-04-08 NOTE — Telephone Encounter (Signed)
Courtesy refill until appointment on 04/29/20.

## 2020-04-09 MED FILL — POTASSIUM CHLORIDE ER 10 ME: 10 | 21 days supply | Qty: 21 | Fill #0

## 2020-04-09 MED FILL — FUROSEMIDE 20 MG TABS: 20 | 21 days supply | Qty: 21 | Fill #0

## 2020-04-10 MED FILL — OMEPRAZOLE 20 MG CAP: 20 | 30 days supply | Qty: 30 | Fill #2

## 2020-04-29 ENCOUNTER — Telehealth: Payer: Self-pay

## 2020-04-29 ENCOUNTER — Ambulatory Visit: Payer: Medicare Other | Admitting: Physician Assistant

## 2020-04-29 DIAGNOSIS — E1165 Type 2 diabetes mellitus with hyperglycemia: Secondary | ICD-10-CM

## 2020-04-29 NOTE — Telephone Encounter (Signed)
Staff contacted patient about upcoming appointment for [facility].

## 2020-04-30 ENCOUNTER — Ambulatory Visit: Payer: Medicare Other

## 2020-05-01 ENCOUNTER — Ambulatory Visit: Payer: Medicare Other | Admitting: Physician Assistant

## 2020-05-01 ENCOUNTER — Other Ambulatory Visit: Payer: Self-pay

## 2020-05-01 ENCOUNTER — Other Ambulatory Visit: Payer: Self-pay | Admitting: Physician Assistant

## 2020-05-01 VITALS — BP 147/90 | HR 63 | Resp 18 | Ht 65.0 in

## 2020-05-01 DIAGNOSIS — F411 Generalized anxiety disorder: Secondary | ICD-10-CM | POA: Diagnosis not present

## 2020-05-01 DIAGNOSIS — I5022 Chronic systolic (congestive) heart failure: Secondary | ICD-10-CM | POA: Diagnosis not present

## 2020-05-01 DIAGNOSIS — E559 Vitamin D deficiency, unspecified: Secondary | ICD-10-CM | POA: Diagnosis not present

## 2020-05-01 DIAGNOSIS — R7401 Elevation of levels of liver transaminase levels: Secondary | ICD-10-CM

## 2020-05-01 DIAGNOSIS — E119 Type 2 diabetes mellitus without complications: Secondary | ICD-10-CM

## 2020-05-01 DIAGNOSIS — E785 Hyperlipidemia, unspecified: Secondary | ICD-10-CM

## 2020-05-01 DIAGNOSIS — K219 Gastro-esophageal reflux disease without esophagitis: Secondary | ICD-10-CM

## 2020-05-01 DIAGNOSIS — R748 Abnormal levels of other serum enzymes: Secondary | ICD-10-CM | POA: Diagnosis not present

## 2020-05-01 LAB — POCT GLYCOSYLATED HEMOGLOBIN (HGB A1C): Hemoglobin A1C: 7.7 % — AB (ref 4.0–5.6)

## 2020-05-01 LAB — GLUCOSE, POCT (MANUAL RESULT ENTRY): POC Glucose: 212 mg/dl — AB (ref 70–99)

## 2020-05-01 MED ORDER — METFORMIN HCL 500 MG PO TABS: 500.0000 mg | ORAL_TABLET | Freq: Every day | ORAL | 2 refills | Status: DC

## 2020-05-01 MED ORDER — ONETOUCH DELICA LANCING DEV MISC: 1.0000 [IU] | Freq: Once | 0 refills | Status: AC

## 2020-05-01 MED ORDER — FUROSEMIDE 20 MG PO TABS: 20.0000 mg | ORAL_TABLET | Freq: Every day | ORAL | 2 refills | Status: DC

## 2020-05-01 MED ORDER — ONETOUCH VERIO VI STRP: ORAL_STRIP | 12 refills | Status: DC

## 2020-05-01 MED ORDER — CARVEDILOL 6.25 MG PO TABS: 6.2500 mg | ORAL_TABLET | Freq: Two times a day (BID) | ORAL | 2 refills | Status: DC

## 2020-05-01 MED ORDER — OMEPRAZOLE 20 MG PO CPDR: 20.0000 mg | DELAYED_RELEASE_CAPSULE | Freq: Every day | ORAL | 2 refills | Status: DC

## 2020-05-01 MED ORDER — POTASSIUM CHLORIDE ER 10 MEQ PO TBCR: EXTENDED_RELEASE_TABLET | ORAL | 2 refills | Status: DC

## 2020-05-01 MED ORDER — ATORVASTATIN CALCIUM 40 MG PO TABS: 40.0000 mg | ORAL_TABLET | Freq: Every day | ORAL | 2 refills | Status: DC

## 2020-05-01 MED ORDER — ONETOUCH DELICA PLUS LANCET33G MISC: 1.0000 [IU] | Freq: Two times a day (BID) | 12 refills | Status: DC

## 2020-05-01 MED ORDER — ONETOUCH VERIO W/DEVICE KIT: 1.0000 [IU] | PACK | Freq: Two times a day (BID) | 0 refills | Status: AC

## 2020-05-01 MED ORDER — ENTRESTO 49-51 MG PO TABS: 1.0000 | ORAL_TABLET | Freq: Two times a day (BID) | ORAL | 2 refills | Status: DC

## 2020-05-01 NOTE — Patient Instructions (Addendum)
You will begin Metformin 500 mg once a day in the morning for your type 2 diabetes.  I encourage you to stop all sugary drinks including ginger ale, and juices, follow Mediterranean-style diet  I encourage you to check your blood sugar on a daily basis, keep a written log and have this available for your health care visits.  I will send a message to the nurse on your behalf regarding your CPAP machine and getting it adjusted.  We will call you with your lab results  Please keep your follow-up visit with [facility] on May 22, 2020.  Please let us know if there is anything else we can do for you  Roney Jaffe, PA-C Physician Assistant [facility] [email or web address]   Blood Glucose Monitoring, Adult Monitoring your blood sugar (glucose) is an important part of managing your diabetes (diabetes mellitus). Blood glucose monitoring involves checking your blood glucose as often as directed and keeping a record (log) of your results over time. Checking your blood glucose regularly and keeping a blood glucose log can:  Help you and your health care provider adjust your diabetes management plan as needed, including your medicines or insulin.  Help you understand how food, exercise, illnesses, and medicines affect your blood glucose.  Let you know what your blood glucose is at any time. You can quickly find out if you have low blood glucose (hypoglycemia) or high blood glucose (hyperglycemia). Your health care provider will set individualized treatment goals for you. Your goals will be based on your age, other medical conditions you have, and how you respond to diabetes treatment. Generally, the goal of treatment is to maintain the following blood glucose levels:  Before meals (preprandial): 80-130 mg/dL (5.0-9.3 mmol/L).  After meals (postprandial): below 180 mg/dL (10 mmol/L).  A1c level: less than 7%. Supplies  needed:  Blood glucose meter.  Test strips for your meter. Each meter has its own strips. You must use the strips that came with your meter.  A needle to prick your finger (lancet). Do not use a lancet more than one time.  A device that holds the lancet (lancing device).  A journal or log book to write down your results. How to check your blood glucose  1. Wash your hands with soap and water. 2. Prick the side of your finger (not the tip) with the lancet. Use a different finger each time. 3. Gently rub the finger until a small drop of blood appears. 4. Follow instructions that come with your meter for inserting the test strip, applying blood to the strip, and using your blood glucose meter. 5. Write down your result and any notes. Some meters allow you to use areas of your body other than your finger (alternative sites) to test your blood. The most common alternative sites are:  Forearm.  Thigh.  Palm of the hand. If you think you may have hypoglycemia, or if you have a history of not knowing when your blood glucose is getting low (hypoglycemia unawareness), do not use alternative sites. Use your finger instead. Alternative sites may not be as accurate as the fingers, because blood flow is slower in these areas. This means that the result you get may be delayed, and it may be different from the result that you would get from your finger. Follow these instructions at home: Blood glucose log   Every time you check your blood glucose, write down your result. Also write down any notes about things  that may be affecting your blood glucose, such as your diet and exercise for the day. This information can help you and your health care provider: ? Look for patterns in your blood glucose over time. ? Adjust your diabetes management plan as needed.  Check if your meter allows you to download your records to a computer. Most glucose meters store a record of glucose readings in the meter. If  you have type 1 diabetes:  Check your blood glucose 2 or more times a day.  Also check your blood glucose: ? Before every insulin injection. ? Before and after exercise. ? Before meals. ? 2 hours after a meal. ? Occasionally between 2:00 a.m. and 3:00 a.m., as directed. ? Before potentially dangerous tasks, like driving or using heavy machinery. ? At bedtime.  You may need to check your blood glucose more often, up to 6-10 times a day, if you: ? Use an insulin pump. ? Need multiple daily injections (MDI). ? Have diabetes that is not well-controlled. ? Are ill. ? Have a history of severe hypoglycemia. ? Have hypoglycemia unawareness. If you have type 2 diabetes:  If you take insulin or other diabetes medicines, check your blood glucose 2 or more times a day.  If you are on intensive insulin therapy, check your blood glucose 4 or more times a day. Occasionally, you may also need to check between 2:00 a.m. and 3:00 a.m., as directed.  Also check your blood glucose: ? Before and after exercise. ? Before potentially dangerous tasks, like driving or using heavy machinery.  You may need to check your blood glucose more often if: ? Your medicine is being adjusted. ? Your diabetes is not well-controlled. ? You are ill. General tips  Always keep your supplies with you.  If you have questions or need help, all blood glucose meters have a 24-hour "hotline" phone number that you can call. You may also contact your health care provider.  After you use a few boxes of test strips, adjust (calibrate) your blood glucose meter by following instructions that came with your meter. Contact a health care provider if:  Your blood glucose is at or above 240 mg/dL (61.6 mmol/L) for 2 days in a row.  You have been sick or have had a fever for 2 days or longer, and you are not getting better.  You have any of the following problems for more than 6 hours: ? You cannot eat or drink. ? You have  nausea or vomiting. ? You have diarrhea. Get help right away if:  Your blood glucose is lower than 54 mg/dL (3 mmol/L).  You become confused or you have trouble thinking clearly.  You have difficulty breathing.  You have moderate or large ketone levels in your urine. Summary  Monitoring your blood sugar (glucose) is an important part of managing your diabetes (diabetes mellitus).  Blood glucose monitoring involves checking your blood glucose as often as directed and keeping a record (log) of your results over time.  Your health care provider will set individualized treatment goals for you. Your goals will be based on your age, other medical conditions you have, and how you respond to diabetes treatment.  Every time you check your blood glucose, write down your result. Also write down any notes about things that may be affecting your blood glucose, such as your diet and exercise for the day. This information is not intended to replace advice given to you by your health care provider. Make  sure you discuss any questions you have with your health care provider. Document Revised: 06/17/2018 Document Reviewed: 02/03/2016 Elsevier Patient Education  2020 [employer]   Mediterranean Diet A Mediterranean diet refers to food and lifestyle choices that are based on the traditions of countries located on the [place]. This way of eating has been shown to help prevent certain conditions and improve outcomes for people who have chronic diseases, like kidney disease and heart disease. What are tips for following this plan? Lifestyle  Cook and eat meals together with your family, when possible.  Drink enough fluid to keep your urine clear or pale yellow.  Be physically active every day. This includes: ? Aerobic exercise like running or swimming. ? Leisure activities like gardening, walking, or housework.  Get 7-8 hours of sleep each night.  If recommended by your health care  provider, drink red wine in moderation. This means 1 glass a day for nonpregnant women and 2 glasses a day for men. A glass of wine equals 5 oz (150 mL). Reading food labels   Check the serving size of packaged foods. For foods such as rice and pasta, the serving size refers to the amount of cooked product, not dry.  Check the total fat in packaged foods. Avoid foods that have saturated fat or trans fats.  Check the ingredients list for added sugars, such as corn syrup. Shopping  At the grocery store, buy most of your food from the areas near the walls of the store. This includes: ? Fresh fruits and vegetables (produce). ? Grains, beans, nuts, and seeds. Some of these may be available in unpackaged forms or large amounts (in bulk). ? Fresh seafood. ? Poultry and eggs. ? Low-fat dairy products.  Buy whole ingredients instead of prepackaged foods.  Buy fresh fruits and vegetables in-season from local farmers markets.  Buy frozen fruits and vegetables in resealable bags.  If you do not have access to quality fresh seafood, buy precooked frozen shrimp or canned fish, such as tuna, salmon, or sardines.  Buy small amounts of raw or cooked vegetables, salads, or olives from the deli or salad bar at your store.  Stock your pantry so you always have certain foods on hand, such as olive oil, canned tuna, canned tomatoes, rice, pasta, and beans. Cooking  Cook foods with extra-virgin olive oil instead of using butter or other vegetable oils.  Have meat as a side dish, and have vegetables or grains as your main dish. This means having meat in small portions or adding small amounts of meat to foods like pasta or stew.  Use beans or vegetables instead of meat in common dishes like chili or lasagna.  Experiment with different cooking methods. Try roasting or broiling vegetables instead of steaming or sauteing them.  Add frozen vegetables to soups, stews, pasta, or rice.  Add nuts or seeds  for added healthy fat at each meal. You can add these to yogurt, salads, or vegetable dishes.  Marinate fish or vegetables using olive oil, lemon juice, garlic, and fresh herbs. Meal planning   Plan to eat 1 vegetarian meal one day each week. Try to work up to 2 vegetarian meals, if possible.  Eat seafood 2 or more times a week.  Have healthy snacks readily available, such as: ? Vegetable sticks with hummus. ? [place] yogurt. ? Fruit and nut trail mix.  Eat balanced meals throughout the week. This includes: ? Fruit: 2-3 servings a day ? Vegetables: 4-5 servings a  day ? Low-fat dairy: 2 servings a day ? Fish, poultry, or lean meat: 1 serving a day ? Beans and legumes: 2 or more servings a week ? Nuts and seeds: 1-2 servings a day ? Whole grains: 6-8 servings a day ? Extra-virgin olive oil: 3-4 servings a day  Limit red meat and sweets to only a few servings a month What are my food choices?  Mediterranean diet ? Recommended  Grains: Whole-grain pasta. Brown rice. Bulgar wheat. Polenta. Couscous. Whole-wheat bread. Orpah Cobb.  Vegetables: Artichokes. Beets. Broccoli. Cabbage. Carrots. Eggplant. Green beans. Chard. Kale. Spinach. Onions. Leeks. Peas. Squash. Tomatoes. Peppers. Radishes.  Fruits: Apples. Apricots. Avocado. Berries. Bananas. Cherries. Dates. Figs. Grapes. Lemons. Melon. Oranges. Peaches. Plums. Pomegranate.  Meats and other protein foods: Beans. Almonds. Sunflower seeds. Pine nuts. Peanuts. Cod. Salmon. Scallops. Shrimp. Tuna. Tilapia. Clams. Oysters. Eggs.  Dairy: Low-fat milk. Cheese. Greek yogurt.  Beverages: Water. Red wine. Herbal tea.  Fats and oils: Extra virgin olive oil. Avocado oil. Grape seed oil.  Sweets and desserts: [place] yogurt with honey. Baked apples. Poached pears. Trail mix.  Seasoning and other foods: Basil. Cilantro. Coriander. Cumin. Mint. Parsley. Sage. Rosemary. Tarragon. Garlic. Oregano. Thyme. Pepper. Balsalmic vinegar. Tahini.  Hummus. Tomato sauce. Olives. Mushrooms. ? Limit these  Grains: Prepackaged pasta or rice dishes. Prepackaged cereal with added sugar.  Vegetables: Deep fried potatoes (french fries).  Fruits: Fruit canned in syrup.  Meats and other protein foods: Beef. Pork. Lamb. Poultry with skin. Hot dogs. Tomasa Blase.  Dairy: Ice cream. Sour cream. Whole milk.  Beverages: Juice. Sugar-sweetened soft drinks. Beer. Liquor and spirits.  Fats and oils: Butter. Canola oil. Vegetable oil. Beef fat (tallow). Lard.  Sweets and desserts: Cookies. Cakes. Pies. Candy.  Seasoning and other foods: Mayonnaise. Premade sauces and marinades. The items listed may not be a complete list. Talk with your dietitian about what dietary choices are right for you. Summary  The Mediterranean diet includes both food and lifestyle choices.  Eat a variety of fresh fruits and vegetables, beans, nuts, seeds, and whole grains.  Limit the amount of red meat and sweets that you eat.  Talk with your health care provider about whether it is safe for you to drink red wine in moderation. This means 1 glass a day for nonpregnant women and 2 glasses a day for men. A glass of wine equals 5 oz (150 mL). This information is not intended to replace advice given to you by your health care provider. Make sure you discuss any questions you have with your health care provider. Document Revised: 04/23/2016 Document Reviewed: 04/16/2016 Elsevier Patient Education  2020 [employer].

## 2020-05-01 NOTE — Progress Notes (Signed)
Established Patient Office Visit  Subjective:  Patient ID: Sabrina Ortiz, female    DOB: 07/15/1971  Age: 49 y.o. MRN: [id number]  CC:  Chief Complaint  Patient presents with  . Medication Refill    HPI Sabrina Ortiz presents for medication refills.  HYPERTENSION  Patient here for follow-up of elevated blood pressure.  She is not exercising and is not adherent to a low-salt diet.   Cardiac symptoms: none. Patient denies: chest pain and irregular heart beat. Cardiovascular risk factors: diabetes mellitus, hypertension and obesity (BMI >= 30 kg/m2). Use of agents associated with hypertension: none. History of target organ damage: heart failure.  Reports that she has been not been using her CPAP on a regular basis, states that she does not feel it is offering relief, states it does not fit well.   Reports that she has been having increased stressors, has not been following a low sugar diet as directed at last office visit due to elevated A1c.  Reports that she has been drinking 2-3 ginger ale's on a daily basis.  Reports that she has been out of her cholesterol medication for the last 2 weeks.   Past Medical History:  Diagnosis Date  . Angio-edema   . Anxiety   . Chronic back pain   . COPD (chronic obstructive pulmonary disease) ([place])   . Depression   . Hypertension   . Mental disorder   . PTSD (post-traumatic stress disorder)   . Urticaria     Past Surgical History:  Procedure Laterality Date  . CYST REMOVAL HAND    . RIGHT/LEFT HEART CATH AND CORONARY ANGIOGRAPHY N/A 04/12/2018   Procedure: RIGHT/LEFT HEART CATH AND CORONARY ANGIOGRAPHY;  Surgeon: Dixie Dials, MD;  Location: [facility];  Service: Cardiovascular;  Laterality: N/A;    Family History  Problem Relation Age of Onset  . Breast cancer Mother   . Breast cancer Maternal Aunt   . Breast cancer Maternal Grandmother     Social History   Socioeconomic History  . Marital status: Single     Spouse name: Not on file  . Number of children: Not on file  . Years of education: Not on file  . Highest education level: Not on file  Occupational History  . Not on file  Tobacco Use  . Smoking status: Never Smoker  . Smokeless tobacco: Never Used  Vaping Use  . Vaping Use: Never used  Substance and Sexual Activity  . Alcohol use: No  . Drug use: No  . Sexual activity: Yes  Other Topics Concern  . Not on file  Social History Narrative  . Not on file   Social Determinants of Health   Financial Resource Strain:   . Difficulty of Paying Living Expenses: Not on file  Food Insecurity: No Food Insecurity  . Worried About Charity fundraiser in the Last Year: Never true  . Ran Out of Food in the Last Year: Never true  Transportation Needs: No Transportation Needs  . Lack of Transportation (Medical): No  . Lack of Transportation (Non-Medical): No  Physical Activity:   . Days of Exercise per Week: Not on file  . Minutes of Exercise per Session: Not on file  Stress:   . Feeling of Stress : Not on file  Social Connections:   . Frequency of Communication with Friends and Family: Not on file  . Frequency of Social Gatherings with Friends and Family: Not on file  . Attends Religious  Services: Not on file  . Active Member of Clubs or Organizations: Not on file  . Attends Archivist Meetings: Not on file  . Marital Status: Not on file  Intimate Partner Violence:   . Fear of Current or Ex-Partner: Not on file  . Emotionally Abused: Not on file  . Physically Abused: Not on file  . Sexually Abused: Not on file    Outpatient Medications Prior to Visit  Medication Sig Dispense Refill  . aspirin EC 81 MG EC tablet Take 1 tablet (81 mg total) by mouth daily.    . busPIRone (BUSPAR) 10 MG tablet Take 1 tablet (10 mg total) by mouth 3 (three) times daily. To treat anxiety 90 tablet 1  . medroxyPROGESTERone (DEPO-PROVERA) 150 MG/ML injection Inject 1 mL (150 mg total) into the  muscle every 3 (three) months. 1 mL 5  . Melatonin 3 MG CAPS Take 1 capsule (3 mg total) by mouth at bedtime as needed. (Patient taking differently: Take 3 mg by mouth at bedtime as needed (sleep). ) 30 capsule 11  . atorvastatin (LIPITOR) 40 MG tablet Take 1 tablet (40 mg total) by mouth daily at 6 PM. Or later/after evening meal 90 tablet 3  . carvedilol (COREG) 6.25 MG tablet Take 1 tablet (6.25 mg total) by mouth 2 (two) times daily with a meal. 180 tablet 3  . furosemide (LASIX) 20 MG tablet Take 1 tablet (20 mg total) by mouth daily. Must keep office visit for refills 21 tablet 0  . omeprazole (PRILOSEC) 20 MG capsule Take 1 capsule (20 mg total) by mouth daily. 30 capsule 2  . potassium chloride (KLOR-CON) 10 MEQ tablet TAKE 1 TABLET (10 MEQ TOTAL) BY MOUTH DAILY. Must keep office visit for additional refills. 21 tablet 0  . sacubitril-valsartan (ENTRESTO) 49-51 MG Take 1 tablet by mouth 2 (two) times daily. 60 tablet 11  . traMADol (ULTRAM) 50 MG tablet Take one to two tabs po q6-8 hours prn pain (Patient not taking: Reported on 05/01/2020) 30 tablet 2  . Vitamin D, Ergocalciferol, (DRISDOL) 1.25 MG (50000 UT) CAPS capsule Take 1 capsule (50,000 Units total) by mouth every 7 (seven) days. (Patient not taking: Reported on 07/31/2019) 16 capsule 0   No facility-administered medications prior to visit.    Allergies  Allergen Reactions  . Shellfish Allergy Anaphylaxis    ROS Review of Systems  Constitutional: Negative.   HENT: Negative.   Eyes: Negative.   Respiratory: Negative.   Cardiovascular: Negative.   Gastrointestinal: Negative.   Endocrine: Negative.   Genitourinary: Negative.   Musculoskeletal: Negative.   Skin: Negative.   Allergic/Immunologic: Negative.   Neurological: Negative.   Hematological: Negative.   Psychiatric/Behavioral: Positive for sleep disturbance. Negative for self-injury and suicidal ideas. The patient is nervous/anxious.       Objective:     Physical Exam Vitals and nursing note reviewed.  Constitutional:      General: She is not in acute distress.    Appearance: Normal appearance. She is not ill-appearing.  HENT:     Head: Normocephalic and atraumatic.     Right Ear: External ear normal.     Left Ear: External ear normal.     Nose: Nose normal.     Mouth/Throat:     Mouth: Mucous membranes are moist.     Pharynx: Oropharynx is clear.  Eyes:     Extraocular Movements: Extraocular movements intact.     Conjunctiva/sclera: Conjunctivae normal.     Pupils:  Pupils are equal, round, and reactive to light.  Cardiovascular:     Rate and Rhythm: Normal rate and regular rhythm.     Pulses: Normal pulses.     Heart sounds: Normal heart sounds.  Pulmonary:     Effort: Pulmonary effort is normal.     Breath sounds: Normal breath sounds.  Abdominal:     General: Abdomen is flat. Bowel sounds are normal.     Palpations: Abdomen is soft.  Musculoskeletal:        General: Normal range of motion.     Cervical back: Normal range of motion and neck supple.  Skin:    General: Skin is warm and dry.  Neurological:     General: No focal deficit present.     Mental Status: She is alert and oriented to person, place, and time.  Psychiatric:        Mood and Affect: Mood normal.        Behavior: Behavior normal.        Thought Content: Thought content normal.        Judgment: Judgment normal.     BP (!) 147/90 (BP Location: Left Arm, Patient Position: Sitting, Cuff Size: Normal)   Pulse 63   Resp 18   Ht _0  (1.651 m)   SpO2 97%   BMI 30.25 kg/m  Wt Readings from Last 3 Encounters:  03/19/20 181 lb 12.8 oz (82.5 kg)  01/02/20 181 lb 8 oz (82.3 kg)  10/16/19 180 lb 11.2 oz (82 kg)     Health Maintenance Due  Topic Date Due  . URINE MICROALBUMIN  Never done  . COVID-19 Vaccine (1) Never done  . TETANUS/TDAP  Never done  . INFLUENZA VACCINE  04/07/2020    There are no preventive care reminders to display for this  patient.  Lab Results  Component Value Date   TSH 1.670 11/11/2018   Lab Results  Component Value Date   WBC 6.8 05/01/2020   HGB 14.9 05/01/2020   HCT 44.0 05/01/2020   MCV 83 05/01/2020   PLT 134 (L) 05/01/2020   Lab Results  Component Value Date   NA 140 05/01/2020   K 4.0 05/01/2020   CO2 21 01/26/2020   GLUCOSE 230 (H) 05/01/2020   BUN 8 05/01/2020   CREATININE 1.11 (H) 05/01/2020   BILITOT 0.6 05/01/2020   ALKPHOS 68 05/01/2020   AST 64 (H) 05/01/2020   ALT 62 (H) 01/26/2020   PROT 7.6 05/01/2020   ALBUMIN 4.8 05/01/2020   CALCIUM 9.6 05/01/2020   ANIONGAP 8 09/18/2018   Lab Results  Component Value Date   CHOL 297 (H) 01/26/2020   Lab Results  Component Value Date   HDL 58 01/26/2020   Lab Results  Component Value Date   LDLCALC 215 (H) 01/26/2020   Lab Results  Component Value Date   TRIG 132 01/26/2020   Lab Results  Component Value Date   CHOLHDL 5.1 (H) 01/26/2020   Lab Results  Component Value Date   HGBA1C 7.7 (A) 05/01/2020      Assessment & Plan:   Problem List Items Addressed This Visit      Cardiovascular and Mediastinum   Systolic heart failure (HCC)   Relevant Medications   atorvastatin (LIPITOR) 40 MG tablet   carvedilol (COREG) 6.25 MG tablet   furosemide (LASIX) 20 MG tablet   potassium chloride (KLOR-CON) 10 MEQ tablet   sacubitril-valsartan (ENTRESTO) 49-51 MG   Other Relevant Orders  CBC with Differential/Platelet (Completed)   Comp. Metabolic Panel (12) (Completed)     Other   Dyslipidemia   Relevant Medications   atorvastatin (LIPITOR) 40 MG tablet   Vitamin D deficiency   Relevant Orders   Vitamin D, 25-hydroxy (Completed)    Other Visit Diagnoses    Type 2 diabetes mellitus without complication, without long-term current use of insulin ([place])    -  Primary   Relevant Medications   atorvastatin (LIPITOR) 40 MG tablet   metFORMIN (GLUCOPHAGE) 500 MG tablet   glucose blood (ONETOUCH VERIO) test strip    Blood Glucose Monitoring Suppl (ONETOUCH VERIO) w/Device KIT   Lancets (ONETOUCH DELICA PLUS [id number]) MISC   Other Relevant Orders   HgB A1c (Completed)   Glucose (CBG) (Completed)   Comp. Metabolic Panel (12) (Completed)   Microalbumin / creatinine urine ratio   GAD (generalized anxiety disorder)       Gastroesophageal reflux disease       Relevant Medications   omeprazole (PRILOSEC) 20 MG capsule   Elevated liver enzymes       Relevant Orders   Comp. Metabolic Panel (12) (Completed)   Hepatitis panel, acute (Completed)   Elevation of levels of liver transaminase levels        Relevant Orders   Hepatitis panel, acute (Completed)     1. Type 2 diabetes mellitus without complication, without long-term current use of insulin (HCC) A1c 7.6.  Patient education given on Mediterranean style diet, discontinue sugary drinks.  Patient education given on blood glucose monitoring, encouraged to keep written log.   - HgB A1c - Glucose (CBG) - Comp. Metabolic Panel (12) - Microalbumin / creatinine urine ratio - glucose blood (ONETOUCH VERIO) test strip; Use as instructed  Dispense: 100 each; Refill: 12 - Blood Glucose Monitoring Suppl (ONETOUCH VERIO) w/Device KIT; 1 Units by Does not apply route in the morning and at bedtime.  Dispense: 1 kit; Refill: 0 - Lancet Devices (ONE TOUCH DELICA LANCING DEV) MISC; 1 Units by Does not apply route once for 1 dose.  Dispense: 1 each; Refill: 0 - Lancets (ONETOUCH DELICA PLUS [id number]) MISC; 1 Units by Does not apply route in the morning and at bedtime.  Dispense: 100 each; Refill: 12  2. Dyslipidemia Resume Lipitor - atorvastatin (LIPITOR) 40 MG tablet; Take 1 tablet (40 mg total) by mouth daily at 6 PM. Or later/after evening meal  Dispense: 30 tablet; Refill: 2  3. GAD (generalized anxiety disorder) Encourage patient to improve sleep hygiene to help reduce anxiety Encourage patient to contact medical supply for adjustment of her CPAP machine  4.  Chronic systolic heart failure (HCC) Continue current regimen - CBC with Differential/Platelet - Comp. Metabolic Panel (12) - carvedilol (COREG) 6.25 MG tablet; Take 1 tablet (6.25 mg total) by mouth 2 (two) times daily with a meal.  Dispense: 60 tablet; Refill: 2 - furosemide (LASIX) 20 MG tablet; Take 1 tablet (20 mg total) by mouth daily.  Dispense: 30 tablet; Refill: 2 - potassium chloride (KLOR-CON) 10 MEQ tablet; TAKE 1 TABLET (10 MEQ TOTAL) BY MOUTH DAILY.  Dispense: 30 tablet; Refill: 2 - sacubitril-valsartan (ENTRESTO) 49-51 MG; Take 1 tablet by mouth 2 (two) times daily.  Dispense: 60 tablet; Refill: 2  5. Gastroesophageal reflux disease Continue current regimen - omeprazole (PRILOSEC) 20 MG capsule; Take 1 capsule (20 mg total) by mouth daily.  Dispense: 30 capsule; Refill: 2  6. Vitamin D deficiency Continue daily supplementation - Vitamin D, 25-hydroxy  8.  Elevation of levels of liver transaminase levels   - Hepatitis panel, acute   I have reviewed the patient's medical history (PMH, PSH, Social History, Family History, Medications, and allergies) , and have been updated if relevant. I spent 30 minutes reviewing chart and  face to face time with patient.    Meds ordered this encounter  Medications  . atorvastatin (LIPITOR) 40 MG tablet    Sig: Take 1 tablet (40 mg total) by mouth daily at 6 PM. Or later/after evening meal    Dispense:  30 tablet    Refill:  2    Order Specific Question:   Supervising Provider    Answer:   Asencion Noble E [[id number]]  . carvedilol (COREG) 6.25 MG tablet    Sig: Take 1 tablet (6.25 mg total) by mouth 2 (two) times daily with a meal.    Dispense:  60 tablet    Refill:  2    Order Specific Question:   Supervising Provider    Answer:   Joya Gaskins, PATRICK E [[id number]]  . furosemide (LASIX) 20 MG tablet    Sig: Take 1 tablet (20 mg total) by mouth daily.    Dispense:  30 tablet    Refill:  2    Order Specific Question:   Supervising Provider     Answer:   Joya Gaskins, PATRICK E [[id number]]  . omeprazole (PRILOSEC) 20 MG capsule    Sig: Take 1 capsule (20 mg total) by mouth daily.    Dispense:  30 capsule    Refill:  2    Order Specific Question:   Supervising Provider    Answer:   Joya Gaskins, PATRICK E [[id number]]  . potassium chloride (KLOR-CON) 10 MEQ tablet    Sig: TAKE 1 TABLET (10 MEQ TOTAL) BY MOUTH DAILY.    Dispense:  30 tablet    Refill:  2    Order Specific Question:   Supervising Provider    Answer:   Joya Gaskins, PATRICK E [[id number]]  . sacubitril-valsartan (ENTRESTO) 49-51 MG    Sig: Take 1 tablet by mouth 2 (two) times daily.    Dispense:  60 tablet    Refill:  2    Order Specific Question:   Supervising Provider    Answer:   Joya Gaskins, PATRICK E [[id number]]  . metFORMIN (GLUCOPHAGE) 500 MG tablet    Sig: Take 1 tablet (500 mg total) by mouth daily with breakfast.    Dispense:  30 tablet    Refill:  2    Order Specific Question:   Supervising Provider    Answer:   Joya Gaskins, PATRICK E [[id number]]  . glucose blood (ONETOUCH VERIO) test strip    Sig: Use as instructed    Dispense:  100 each    Refill:  12    Order Specific Question:   Supervising Provider    Answer:   Asencion Noble E [[id number]]  . Blood Glucose Monitoring Suppl (ONETOUCH VERIO) w/Device KIT    Sig: 1 Units by Does not apply route in the morning and at bedtime.    Dispense:  1 kit    Refill:  0    Order Specific Question:   Supervising Provider    Answer:   Joya Gaskins, PATRICK E [[id number]]  . Lancet Devices (ONE TOUCH DELICA LANCING DEV) MISC    Sig: 1 Units by Does not apply route once for 1 dose.    Dispense:  1 each    Refill:  0    Order Specific Question:  Supervising Provider    Answer:   Noralyn Pick  . Lancets (ONETOUCH DELICA PLUS [id number]) MISC    Sig: 1 Units by Does not apply route in the morning and at bedtime.    Dispense:  100 each    Refill:  12    Order Specific Question:   Supervising Provider    Answer:   Elsie Stain [[id number]]    Follow-up:  Return if symptoms worsen or fail to improve.    Loraine Grip Mayers, PA-C

## 2020-05-01 NOTE — Progress Notes (Signed)
Patient has been out of medication for the past 2 weeks. Patient denies having DM and does not take any medications for this concern.

## 2020-05-02 DIAGNOSIS — E1165 Type 2 diabetes mellitus with hyperglycemia: Secondary | ICD-10-CM | POA: Insufficient documentation

## 2020-05-02 DIAGNOSIS — K219 Gastro-esophageal reflux disease without esophagitis: Secondary | ICD-10-CM | POA: Insufficient documentation

## 2020-05-02 DIAGNOSIS — R7401 Elevation of levels of liver transaminase levels: Secondary | ICD-10-CM | POA: Insufficient documentation

## 2020-05-02 LAB — CBC WITH DIFFERENTIAL/PLATELET
Basophils Absolute: 0 10*3/uL (ref 0.0–0.2)
Basos: 0 %
EOS (ABSOLUTE): 0.1 10*3/uL (ref 0.0–0.4)
Eos: 1 %
Hematocrit: 44 % (ref 34.0–46.6)
Hemoglobin: 14.9 g/dL (ref 11.1–15.9)
Immature Grans (Abs): 0 10*3/uL (ref 0.0–0.1)
Immature Granulocytes: 0 %
Lymphocytes Absolute: 2 10*3/uL (ref 0.7–3.1)
Lymphs: 30 %
MCH: 28 pg (ref 26.6–33.0)
MCHC: 33.9 g/dL (ref 31.5–35.7)
MCV: 83 fL (ref 79–97)
Monocytes Absolute: 0.5 10*3/uL (ref 0.1–0.9)
Monocytes: 8 %
Neutrophils Absolute: 4.1 10*3/uL (ref 1.4–7.0)
Neutrophils: 61 %
Platelets: 134 10*3/uL — ABNORMAL LOW (ref 150–450)
RBC: 5.32 x10E6/uL — ABNORMAL HIGH (ref 3.77–5.28)
RDW: 14.3 % (ref 11.7–15.4)
WBC: 6.8 10*3/uL (ref 3.4–10.8)

## 2020-05-02 LAB — COMP. METABOLIC PANEL (12)
AST: 64 IU/L — ABNORMAL HIGH (ref 0–40)
Albumin/Globulin Ratio: 1.7 (ref 1.2–2.2)
Albumin: 4.8 g/dL (ref 3.8–4.8)
Alkaline Phosphatase: 68 IU/L (ref 48–121)
BUN/Creatinine Ratio: 7 — ABNORMAL LOW (ref 9–23)
BUN: 8 mg/dL (ref 6–24)
Bilirubin Total: 0.6 mg/dL (ref 0.0–1.2)
Calcium: 9.6 mg/dL (ref 8.7–10.2)
Chloride: 103 mmol/L (ref 96–106)
Creatinine, Ser: 1.11 mg/dL — ABNORMAL HIGH (ref 0.57–1.00)
GFR calc Af Amer: 67 mL/min/{1.73_m2} (ref >59)
GFR calc non Af Amer: 58 mL/min/{1.73_m2} — ABNORMAL LOW (ref >59)
Globulin, Total: 2.8 g/dL (ref 1.5–4.5)
Glucose: 230 mg/dL — ABNORMAL HIGH (ref 65–99)
Potassium: 4 mmol/L (ref 3.5–5.2)
Sodium: 140 mmol/L (ref 134–144)
Total Protein: 7.6 g/dL (ref 6.0–8.5)

## 2020-05-02 LAB — HEPATITIS PANEL, ACUTE
Hep A IgM: NEGATIVE
Hep B C IgM: NEGATIVE
Hep C Virus Ab: <0.1 s/co ratio (ref 0.0–0.9)
Hepatitis B Surface Ag: NEGATIVE

## 2020-05-02 LAB — MICROALBUMIN / CREATININE URINE RATIO
Creatinine, Urine: 346.5 mg/dL
Microalb/Creat Ratio: 87 mg/g creat — ABNORMAL HIGH (ref 0–29)
Microalbumin, Urine: 302.3 ug/mL

## 2020-05-02 LAB — VITAMIN D 25 HYDROXY (VIT D DEFICIENCY, FRACTURES): Vit D, 25-Hydroxy: 18.3 ng/mL — ABNORMAL LOW (ref 30.0–100.0)

## 2020-05-02 MED FILL — METFORMIN HCL 500 MG TABS: 500 | 30 days supply | Qty: 30 | Fill #0

## 2020-05-02 MED FILL — POTASSIUM CHLORIDE ER 10 ME: 10 | 30 days supply | Qty: 30 | Fill #0

## 2020-05-02 MED FILL — FUROSEMIDE 20 MG TABS: 20 | 30 days supply | Qty: 30 | Fill #0

## 2020-05-02 MED FILL — ENTRESTO 49 MG-51 MG TABLET: 49-51 | 30 days supply | Qty: 60 | Fill #0

## 2020-05-02 MED FILL — CARVEDILOL 6.25 MG TABLET: 6.25 | 30 days supply | Qty: 60 | Fill #0

## 2020-05-06 NOTE — Telephone Encounter (Signed)
-----   Message from Roney Jaffe, [name] sent at 05/02/2020 11:55 AM EDT ----- Please also let her know her microalbumin was also elevated, this should improve when her Bg is better under control, she needs to have this rechecked

## 2020-05-22 ENCOUNTER — Ambulatory Visit: Payer: Medicare Other | Attending: Physician Assistant | Admitting: Physician Assistant

## 2020-05-28 ENCOUNTER — Other Ambulatory Visit: Payer: Self-pay | Admitting: Family Medicine

## 2020-05-28 DIAGNOSIS — E119 Type 2 diabetes mellitus without complications: Secondary | ICD-10-CM

## 2020-05-28 MED FILL — OMEPRAZOLE 20 MG CAP: 20 | 30 days supply | Qty: 30 | Fill #0

## 2020-05-28 NOTE — Telephone Encounter (Signed)
Blood Glucose Monitoring Suppl (ONETOUCH VERIO) w/Device KIT Requesting this  [facility] [phone] - [place], [place] - [phone] AT [facility] [address] [place] [address]  Phone: [phone] Fax: [phone]

## 2020-05-28 NOTE — Telephone Encounter (Signed)
[facility] called and spoke to [place], [facility] about the refill. He says it's already taken care of to disregard the request.

## 2020-06-04 ENCOUNTER — Ambulatory Visit (INDEPENDENT_AMBULATORY_CARE_PROVIDER_SITE_OTHER): Payer: Medicare Other | Admitting: Certified Nurse Midwife

## 2020-06-04 ENCOUNTER — Other Ambulatory Visit: Payer: Self-pay

## 2020-06-04 ENCOUNTER — Other Ambulatory Visit (HOSPITAL_COMMUNITY)
Admission: RE | Admit: 2020-06-04 | Discharge: 2020-06-04 | Disposition: A | Discharge status: Home / Self Care | Payer: Medicare Other | Source: Ambulatory Visit | Attending: Certified Nurse Midwife | Admitting: Certified Nurse Midwife

## 2020-06-04 ENCOUNTER — Encounter: Payer: Self-pay | Admitting: Certified Nurse Midwife

## 2020-06-04 VITALS — BP 144/107 | HR 96 | Ht 63.5 in | Wt 180.2 lb

## 2020-06-04 DIAGNOSIS — Z3042 Encounter for surveillance of injectable contraceptive: Secondary | ICD-10-CM

## 2020-06-04 DIAGNOSIS — Z0000 Encounter for general adult medical examination without abnormal findings: Secondary | ICD-10-CM | POA: Diagnosis not present

## 2020-06-04 DIAGNOSIS — Z01419 Encounter for gynecological examination (general) (routine) without abnormal findings: Secondary | ICD-10-CM | POA: Diagnosis not present

## 2020-06-04 DIAGNOSIS — Z1231 Encounter for screening mammogram for malignant neoplasm of breast: Secondary | ICD-10-CM

## 2020-06-04 DIAGNOSIS — Z1151 Encounter for screening for human papillomavirus (HPV): Secondary | ICD-10-CM | POA: Diagnosis not present

## 2020-06-04 MED ORDER — MEDROXYPROGESTERONE ACETATE 150 MG/ML IM SUSP
150.0000 mg | Freq: Once | INTRAMUSCULAR | Status: AC
Start: 2020-06-04 — End: 2020-06-04
  Administered 2020-06-04: 150 mg via INTRAMUSCULAR

## 2020-06-04 NOTE — Progress Notes (Signed)
GYNECOLOGY CLINIC ANNUAL PREVENTATIVE CARE ENCOUNTER NOTE  Subjective:   Sabrina Ortiz is a 49 y.o. [phone] female here for a routine annual gynecologic exam.  Current complaints: Various life stressors and finding it difficult to take her BP meds before bedtime. Also reports some soreness in her right breast, she thinks is due to her underwire bras.   Denies abnormal vaginal bleeding, discharge, pelvic pain, problems with intercourse or other gynecologic concerns.    Gynecologic History No LMP recorded. Patient has had an injection. Contraception: Depo-Provera injections Last Pap: 11/14/18. Results were: abnormal with f/u 02/01/19 which was normal Last mammogram: N/A.   Obstetric History OB History  Gravida Para Term Preterm AB Living  '3 1     2 1  ' SAB TAB Ectopic Multiple Live Births    2          # Outcome Date GA Lbr Len/2nd Weight Sex Delivery Anes PTL Lv  3 TAB           2 TAB           1 Para             Past Medical History:  Diagnosis Date  . Angio-edema   . Anxiety   . Chronic back pain   . COPD (chronic obstructive pulmonary disease) ([place])   . Depression   . Hypertension   . Mental disorder   . PTSD (post-traumatic stress disorder)   . Urticaria     Past Surgical History:  Procedure Laterality Date  . CYST REMOVAL HAND    . RIGHT/LEFT HEART CATH AND CORONARY ANGIOGRAPHY N/A 04/12/2018   Procedure: RIGHT/LEFT HEART CATH AND CORONARY ANGIOGRAPHY;  Surgeon: Dixie Dials, MD;  Location: [facility] [facility];  Service: Cardiovascular;  Laterality: N/A;    Current Outpatient Medications on File Prior to Visit  Medication Sig Dispense Refill  . aspirin EC 81 MG EC tablet Take 1 tablet (81 mg total) by mouth daily.    Marland Kitchen atorvastatin (LIPITOR) 40 MG tablet Take 1 tablet (40 mg total) by mouth daily at 6 PM. Or later/after evening meal 30 tablet 2  . Blood Glucose Monitoring Suppl (ONETOUCH VERIO) w/Device KIT 1 Units by Does not apply route in the morning and at  bedtime. 1 kit 0  . busPIRone (BUSPAR) 10 MG tablet Take 1 tablet (10 mg total) by mouth 3 (three) times daily. To treat anxiety 90 tablet 1  . carvedilol (COREG) 6.25 MG tablet Take 1 tablet (6.25 mg total) by mouth 2 (two) times daily with a meal. 60 tablet 2  . furosemide (LASIX) 20 MG tablet Take 1 tablet (20 mg total) by mouth daily. 30 tablet 2  . glucose blood (ONETOUCH VERIO) test strip Use as instructed 100 each 12  . Lancets (ONETOUCH DELICA PLUS [id number]) MISC 1 Units by Does not apply route in the morning and at bedtime. 100 each 12  . Melatonin 3 MG CAPS Take 1 capsule (3 mg total) by mouth at bedtime as needed. (Patient taking differently: Take 3 mg by mouth at bedtime as needed (sleep). ) 30 capsule 11  . metFORMIN (GLUCOPHAGE) 500 MG tablet Take 1 tablet (500 mg total) by mouth daily with breakfast. 30 tablet 2  . omeprazole (PRILOSEC) 20 MG capsule Take 1 capsule (20 mg total) by mouth daily. 30 capsule 2  . potassium chloride (KLOR-CON) 10 MEQ tablet TAKE 1 TABLET (10 MEQ TOTAL) BY MOUTH DAILY. 30 tablet 2  .  sacubitril-valsartan (ENTRESTO) 49-51 MG Take 1 tablet by mouth 2 (two) times daily. 60 tablet 2  . traMADol (ULTRAM) 50 MG tablet Take one to two tabs po q6-8 hours prn pain 30 tablet 2  . Vitamin D, Ergocalciferol, (DRISDOL) 1.25 MG (50000 UT) CAPS capsule Take 1 capsule (50,000 Units total) by mouth every 7 (seven) days. (Patient not taking: Reported on 07/31/2019) 16 capsule 0  . [DISCONTINUED] potassium chloride (K-DUR,KLOR-CON) 10 MEQ tablet Take 1 tablet (10 mEq total) by mouth daily. 30 tablet 3   No current facility-administered medications on file prior to visit.    Allergies  Allergen Reactions  . Shellfish Allergy Anaphylaxis    Social History   Socioeconomic History  . Marital status: Single    Spouse name: Not on file  . Number of children: Not on file  . Years of education: Not on file  . Highest education level: Not on file  Occupational History    . Not on file  Tobacco Use  . Smoking status: Never Smoker  . Smokeless tobacco: Never Used  Vaping Use  . Vaping Use: Never used  Substance and Sexual Activity  . Alcohol use: No  . Drug use: No  . Sexual activity: Not Currently  Other Topics Concern  . Not on file  Social History Narrative  . Not on file   Social Determinants of Health   Financial Resource Strain:   . Difficulty of Paying Living Expenses: Not on file  Food Insecurity: No Food Insecurity  . Worried About Charity fundraiser in the Last Year: Never true  . Ran Out of Food in the Last Year: Never true  Transportation Needs: No Transportation Needs  . Lack of Transportation (Medical): No  . Lack of Transportation (Non-Medical): No  Physical Activity:   . Days of Exercise per Week: Not on file  . Minutes of Exercise per Session: Not on file  Stress:   . Feeling of Stress : Not on file  Social Connections:   . Frequency of Communication with Friends and Family: Not on file  . Frequency of Social Gatherings with Friends and Family: Not on file  . Attends Religious Services: Not on file  . Active Member of Clubs or Organizations: Not on file  . Attends Archivist Meetings: Not on file  . Marital Status: Not on file  Intimate Partner Violence:   . Fear of Current or Ex-Partner: Not on file  . Emotionally Abused: Not on file  . Physically Abused: Not on file  . Sexually Abused: Not on file    Family History  Problem Relation Age of Onset  . Breast cancer Mother   . Breast cancer Maternal Aunt   . Breast cancer Maternal Grandmother     The following portions of the patient's history were reviewed and updated as appropriate: allergies, current medications, past family history, past medical history, past social history, past surgical history and problem list.  Review of Systems Pertinent items noted in HPI and remainder of comprehensive ROS otherwise negative.   Objective:  BP (!) 144/107    Pulse 96   Ht 5' 3.5" (1.613 m)   Wt 180 lb 3.2 oz (81.7 kg)   BMI 31.42 kg/m  CONSTITUTIONAL: Well-developed, well-nourished female in no acute distress.  HENT:  Normocephalic, atraumatic, External right and left ear normal. Oropharynx is clear and moist EYES: Conjunctivae and EOM are normal. Pupils are equal, round, and reactive to light. No scleral icterus.  NECK: Normal range of motion, supple, no masses.  Normal thyroid.  SKIN: Skin is warm and dry. No rash noted. Not diaphoretic. No erythema. No pallor. [place]: Alert and oriented to person, place, and time. Normal reflexes, muscle tone coordination. No cranial nerve deficit noted. PSYCHIATRIC: Normal mood and affect. Normal behavior. Normal judgment and thought content. CARDIOVASCULAR: Normal heart rate noted, regular rhythm RESPIRATORY: Clear to auscultation bilaterally. Effort and breath sounds normal, no problems with respiration noted. BREASTS: Symmetric in size. No masses, skin changes, nipple drainage, or lymphadenopathy. ABDOMEN: Soft, normal bowel sounds, no distention noted.  No tenderness, rebound or guarding.  PELVIC: Normal appearing external genitalia; normal appearing vaginal mucosa and cervix.  No abnormal discharge noted.  Pap smear obtained.  Normal uterine size, no other palpable masses, no uterine or adnexal tenderness. MUSCULOSKELETAL: Normal range of motion. No tenderness.  No cyanosis, clubbing, or edema.  2+ distal pulses.   Assessment:  Annual gynecologic examination with pap smear Depo-Provera for AUB   Plan:  Will follow up results of pap smear and manage accordingly. Mammogram scheduled Routine preventative health maintenance measures emphasized. Encouraged to call her PCP for better management of her BP Please refer to After Visit Summary for other counseling recommendations.   Gaylan Gerold, CNM, MSN, [facility] 06/04/20 2:32 PM

## 2020-06-04 NOTE — Progress Notes (Signed)
Here for annual exam to continue depo-provera . bp elevated. States taking bp meds in am but missed some evening does due to falls asleep.  Naleigha Raimondi,RN

## 2020-06-05 LAB — CYTOLOGY - PAP
Adequacy: ABSENT
Comment: NEGATIVE
Diagnosis: NEGATIVE
High risk HPV: NEGATIVE

## 2020-06-06 ENCOUNTER — Other Ambulatory Visit: Payer: Self-pay | Admitting: Certified Nurse Midwife

## 2020-06-06 DIAGNOSIS — Z1231 Encounter for screening mammogram for malignant neoplasm of breast: Secondary | ICD-10-CM

## 2020-06-18 MED FILL — FUROSEMIDE 20 MG TABS: 20 | 30 days supply | Qty: 30 | Fill #1

## 2020-07-08 MED FILL — CARVEDILOL 6.25 MG TABLET: 6.25 | 30 days supply | Qty: 60 | Fill #1

## 2020-07-08 MED FILL — POTASSIUM CHLORIDE ER 10 ME: 10 | 30 days supply | Qty: 30 | Fill #1

## 2020-07-24 MED FILL — OMEPRAZOLE 20 MG CAP: 20 | 60 days supply | Qty: 60 | Fill #1

## 2020-08-07 MED FILL — FUROSEMIDE 20 MG TABS: 20 | 30 days supply | Qty: 30 | Fill #2

## 2020-08-07 MED FILL — ENTRESTO 49 MG-51 MG TABLET: 49-51 | 30 days supply | Qty: 60 | Fill #1

## 2020-08-28 MED FILL — POTASSIUM CHLORIDE ER 10 ME: 10 | 30 days supply | Qty: 30 | Fill #2

## 2020-09-11 ENCOUNTER — Other Ambulatory Visit: Payer: Self-pay | Admitting: Physician Assistant

## 2020-09-11 DIAGNOSIS — I5022 Chronic systolic (congestive) heart failure: Secondary | ICD-10-CM

## 2020-09-11 MED FILL — CARVEDILOL 6.25 MG TABLET: 6.25 | 30 days supply | Qty: 60 | Fill #2

## 2020-09-14 ENCOUNTER — Other Ambulatory Visit: Payer: Self-pay | Admitting: Family

## 2020-09-16 MED FILL — FUROSEMIDE 20 MG TABS: 20 | 30 days supply | Qty: 30 | Fill #0

## 2020-09-19 ENCOUNTER — Other Ambulatory Visit: Payer: Self-pay | Admitting: Internal Medicine

## 2020-09-19 DIAGNOSIS — E119 Type 2 diabetes mellitus without complications: Secondary | ICD-10-CM

## 2020-09-19 NOTE — Telephone Encounter (Signed)
Requested medication (s) are due for refill today: yes  Requested medication (s) are on the active medication list: yes  Last refill:  05/01/20 #100 12 refills  Future visit scheduled: yes in 3 weeks  Notes to clinic:  medication requested but ordered by Marguerita Merles, PA. Not at this practice. Do you want to refill Rx?     Requested Prescriptions  Pending Prescriptions Disp Refills   glucose blood (ONETOUCH VERIO) test strip 100 each 12    Sig: Use as instructed      Endocrinology: Diabetes - Testing Supplies Passed - 09/19/2020  6:37 PM      Passed - Valid encounter within last 12 months    Recent Outpatient Visits           8 months ago Sore throat   [facility] [place], [place], NP   1 year ago Essential hypertension   [facility] [place], [place], MD   1 year ago Essential hypertension   [place] [facility] [place], [place], MD   1 year ago Chronic systolic heart failure [facility])   [place] [facility] [place], [place], MD   2 years ago OSA on CPAP   [place] [facility] [place], [place], MD       Future Appointments             In 3 weeks [place], Marzella Schlein, PA-C [place] [facility]               Lancets (ONETOUCH DELICA PLUS [place]) MISC 100 each 12    Sig: 1 Units by Does not apply route in the morning and at bedtime.      Endocrinology: Diabetes - Testing Supplies Passed - 09/19/2020  6:37 PM      Passed - Valid encounter within last 12 months    Recent Outpatient Visits           8 months ago Sore throat   [facility] [place], [place], NP   1 year ago Essential hypertension   [place] [facility] [place], [place], MD   1 year ago Essential hypertension   [place] [place], [place], MD   1 year ago Chronic systolic heart failure [facility])     [facility] [place], [place], MD   2 years ago OSA on CPAP   [facility] [place], [place], MD       Future Appointments             In 3 weeks [place], Marzella Schlein, PA-C [facility]

## 2020-09-19 NOTE — Telephone Encounter (Signed)
Copied from CRM [id number]. Topic: Quick Communication - Rx Refill/Question >> Sep 19, 2020  3:16 PM Jaquita Rector A wrote: Medication: Lancets [facility] DELICA PLUS LANCET33G) MISC, glucose blood (ONETOUCH VERIO) test strip   Has the patient contacted their pharmacy? Yes.   (Agent: If no, request that the patient contact the pharmacy for the refill.) (Agent: If yes, when and what did the pharmacy advise?)  Preferred Pharmacy (with phone number or street name): [facility] [phone] - [place], [place] - [address] AT [address]  Phone:  [phone] Fax:  [phone]     Agent: Please be advised that RX refills may take up to 3 business days. We ask that you follow-up with your pharmacy.

## 2020-09-20 MED ORDER — ONETOUCH VERIO VI STRP: ORAL_STRIP | 12 refills | Status: DC

## 2020-09-20 MED ORDER — ONETOUCH DELICA PLUS LANCET33G MISC: 1.0000 [IU] | Freq: Two times a day (BID) | 12 refills | Status: DC

## 2020-10-10 MED FILL — ENTRESTO 49 MG-51 MG TABLET: 49-51 | 30 days supply | Qty: 60 | Fill #2

## 2020-10-10 MED FILL — ATORVASTATIN CALCIUM 40 MG: 40 | 30 days supply | Qty: 30 | Fill #0

## 2020-10-16 ENCOUNTER — Ambulatory Visit: Payer: Medicare Other | Admitting: Physician Assistant

## 2020-10-16 ENCOUNTER — Other Ambulatory Visit: Payer: Self-pay

## 2020-10-16 ENCOUNTER — Encounter: Payer: Self-pay | Admitting: Physician Assistant

## 2020-10-16 ENCOUNTER — Other Ambulatory Visit: Payer: Self-pay | Admitting: Physician Assistant

## 2020-10-16 ENCOUNTER — Ambulatory Visit: Payer: Medicare Other | Attending: Physician Assistant | Admitting: Physician Assistant

## 2020-10-16 DIAGNOSIS — I5022 Chronic systolic (congestive) heart failure: Secondary | ICD-10-CM | POA: Diagnosis not present

## 2020-10-16 DIAGNOSIS — E1165 Type 2 diabetes mellitus with hyperglycemia: Secondary | ICD-10-CM

## 2020-10-16 DIAGNOSIS — E119 Type 2 diabetes mellitus without complications: Secondary | ICD-10-CM | POA: Diagnosis not present

## 2020-10-16 DIAGNOSIS — K219 Gastro-esophageal reflux disease without esophagitis: Secondary | ICD-10-CM

## 2020-10-16 DIAGNOSIS — E785 Hyperlipidemia, unspecified: Secondary | ICD-10-CM

## 2020-10-16 MED ORDER — METFORMIN HCL 500 MG PO TABS: 500.0000 mg | ORAL_TABLET | Freq: Every day | ORAL | 2 refills | Status: DC

## 2020-10-16 MED ORDER — FUROSEMIDE 20 MG PO TABS
20.0000 mg | ORAL_TABLET | Freq: Every day | ORAL | 2 refills | Status: DC
Start: 2020-10-16 — End: 2020-10-16

## 2020-10-16 MED ORDER — POTASSIUM CHLORIDE ER 10 MEQ PO TBCR: EXTENDED_RELEASE_TABLET | ORAL | 2 refills | Status: DC

## 2020-10-16 MED ORDER — ONETOUCH DELICA PLUS LANCET33G MISC
1.0000 [IU] | Freq: Two times a day (BID) | 12 refills | Status: DC
Start: 2020-10-16 — End: 2021-07-14

## 2020-10-16 MED ORDER — ATORVASTATIN CALCIUM 40 MG PO TABS: 40.0000 mg | ORAL_TABLET | Freq: Every day | ORAL | 2 refills | Status: DC

## 2020-10-16 MED ORDER — ENTRESTO 49-51 MG PO TABS: 1.0000 | ORAL_TABLET | Freq: Two times a day (BID) | ORAL | 2 refills | Status: DC

## 2020-10-16 MED ORDER — OMEPRAZOLE 20 MG PO CPDR: 20.0000 mg | DELAYED_RELEASE_CAPSULE | Freq: Every day | ORAL | 2 refills | Status: DC

## 2020-10-16 MED ORDER — ONETOUCH VERIO VI STRP: ORAL_STRIP | 12 refills | Status: DC

## 2020-10-16 MED FILL — FUROSEMIDE 20 MG TABS: 20 | 30 days supply | Qty: 30 | Fill #0

## 2020-10-16 MED FILL — POTASSIUM CHLORIDE ER 10 ME: 10 | 30 days supply | Qty: 30 | Fill #0

## 2020-10-16 MED FILL — METFORMIN HCL 500 MG TABS: 500 | 30 days supply | Qty: 30 | Fill #0

## 2020-10-16 MED FILL — OMEPRAZOLE 20 MG CAP: 20 | 30 days supply | Qty: 30 | Fill #0

## 2020-10-16 NOTE — Progress Notes (Signed)
Virtual Visit via Telephone Note  I connected with Oviya Ammar [place] on 10/16/20 at  3:10 PM EST by telephone and verified that I am speaking with the correct person using two identifiers.  Location: Patient: home Provider: [facility]  I discussed the limitations, risks, security and privacy concerns of performing an evaluation and management service by telephone and the availability of in person appointments. I also discussed with the patient that there may be a patient responsible charge related to this service. The patient expressed understanding and agreed to proceed.   History of Present Illness: Patient was planning to come in person but someone that tested positive was in her house for a few mins 5 days ago.  She does not have any symptoms.  She says her blood sugars have been running under 200.  No new issues or concerns today.  Needs lancets and test strips.     Observations/Objective: NAD.  A&Ox3  Assessment and Plan: 1. Dyslipidemia - atorvastatin (LIPITOR) 40 MG tablet; Take 1 tablet (40 mg total) by mouth daily at 6 PM. Or later/after evening meal  Dispense: 30 tablet; Refill: 2 - Lipid panel; Future  2. Chronic systolic heart failure (HCC) - sacubitril-valsartan (ENTRESTO) 49-51 MG; Take 1 tablet by mouth 2 (two) times daily.  Dispense: 60 tablet; Refill: 2 - potassium chloride (KLOR-CON) 10 MEQ tablet; TAKE 1 TABLET (10 MEQ TOTAL) BY MOUTH DAILY.  Dispense: 30 tablet; Refill: 2 - furosemide (LASIX) 20 MG tablet; Take 1 tablet (20 mg total) by mouth daily.  Dispense: 30 tablet; Refill: 2  3. Gastroesophageal reflux disease without esophagitis - omeprazole (PRILOSEC) 20 MG capsule; Take 1 capsule (20 mg total) by mouth daily.  Dispense: 30 capsule; Refill: 2  4. Type 2 diabetes mellitus with hyperglycemia, without long-term current use of insulin (HCC) Doubt controlled-she agrees to come within the next 2 weeks for labs and I will adjust accordingly - metFORMIN (GLUCOPHAGE)  500 MG tablet; Take 1 tablet (500 mg total) by mouth daily with breakfast.  Dispense: 30 tablet; Refill: 2 - Hemoglobin A1c; Future - Comprehensive metabolic panel; Future - CBC with Differential/Platelet; Future  5. Type 2 diabetes mellitus without complication, without long-term current use of insulin (HCC) - Lancets (ONETOUCH DELICA PLUS LANCET33G) MISC; 1 Units by Does not apply route in the morning and at bedtime.  Dispense: 100 each; Refill: 12 - glucose blood (ONETOUCH VERIO) test strip; Use as instructed  Dispense: 100 each; Refill: 12   Follow Up Instructions: See PCP in 2-3 months   I discussed the assessment and treatment plan with the patient. The patient was provided an opportunity to ask questions and all were answered. The patient agreed with the plan and demonstrated an understanding of the instructions.   The patient was advised to call back or seek an in-person evaluation if the symptoms worsen or if the condition fails to improve as anticipated.  I provided 14 minutes of non-face-to-face time during this encounter.   [place], PA-C  Patient ID: Zoey Bidwell, female   DOB: 05/26/1971, 50 y.o.   MRN: [id number]

## 2020-10-29 ENCOUNTER — Ambulatory Visit: Payer: Medicare Other | Admitting: Family Medicine

## 2020-11-27 ENCOUNTER — Other Ambulatory Visit: Payer: Self-pay | Admitting: Physician Assistant

## 2020-11-27 DIAGNOSIS — I5022 Chronic systolic (congestive) heart failure: Secondary | ICD-10-CM

## 2020-11-27 MED FILL — FUROSEMIDE 20 MG TABS: 20 | 30 days supply | Qty: 30 | Fill #1

## 2020-11-29 ENCOUNTER — Other Ambulatory Visit: Payer: Self-pay | Admitting: Pharmacist

## 2020-11-29 ENCOUNTER — Other Ambulatory Visit: Payer: Self-pay | Admitting: Pharmacy Technician

## 2020-11-29 DIAGNOSIS — I5022 Chronic systolic (congestive) heart failure: Secondary | ICD-10-CM

## 2020-11-29 MED ORDER — CARVEDILOL 6.25 MG PO TABS: 6.2500 mg | ORAL_TABLET | Freq: Two times a day (BID) | ORAL | 2 refills | Status: DC

## 2020-11-29 MED FILL — CARVEDILOL 6.25 MG TABLET: 6.25 | 30 days supply | Qty: 60 | Fill #0

## 2021-01-07 ENCOUNTER — Other Ambulatory Visit: Payer: Self-pay

## 2021-01-07 MED FILL — Omeprazole Cap Delayed Release 20 MG: ORAL | 30 days supply | Qty: 30 | Fill #0 | Status: AC

## 2021-01-07 MED FILL — Sacubitril-Valsartan Tab 49-51 MG: ORAL | 30 days supply | Qty: 60 | Fill #0 | Status: AC

## 2021-01-07 MED FILL — Potassium Chloride Tab ER 10 mEq: ORAL | 30 days supply | Qty: 30 | Fill #0 | Status: AC

## 2021-01-14 ENCOUNTER — Encounter: Payer: Self-pay | Admitting: Obstetrics and Gynecology

## 2021-01-14 ENCOUNTER — Other Ambulatory Visit: Payer: Self-pay

## 2021-01-14 ENCOUNTER — Ambulatory Visit (INDEPENDENT_AMBULATORY_CARE_PROVIDER_SITE_OTHER): Payer: Medicare Other | Admitting: Obstetrics and Gynecology

## 2021-01-14 VITALS — BP 130/91 | HR 99 | Ht 65.0 in | Wt 180.0 lb

## 2021-01-14 DIAGNOSIS — Z5941 Food insecurity: Secondary | ICD-10-CM

## 2021-01-14 DIAGNOSIS — Z3202 Encounter for pregnancy test, result negative: Secondary | ICD-10-CM

## 2021-01-14 DIAGNOSIS — Z3042 Encounter for surveillance of injectable contraceptive: Secondary | ICD-10-CM | POA: Diagnosis not present

## 2021-01-14 LAB — POCT PREGNANCY, URINE: Preg Test, Ur: NEGATIVE

## 2021-01-14 MED ORDER — MEDROXYPROGESTERONE ACETATE 150 MG/ML IM SUSP
150.0000 mg | Freq: Once | INTRAMUSCULAR | Status: AC
  Administered 2021-01-14: 150 mg via INTRAMUSCULAR

## 2021-01-14 NOTE — Addendum Note (Signed)
Addended by: Guy Begin on: 01/14/2021 04:05 PM   Modules accepted: Orders

## 2021-01-14 NOTE — Progress Notes (Signed)
   History:  Ms. Sabrina Ortiz is a 50 y.o. [id number] who presents to clinic today for depo shot.  Missed her last dose. Since missing dose is having irregular vaginal bleeding again, has period about twice a month. Would like ot restart depo. No other issues. Depo improved her vaginal bleeding in past. Has not had sex in four years.   The following portions of the patient's history were reviewed and updated as appropriate: allergies, current medications, family history, past medical history, social history, past surgical history and problem list.  Review of Systems:  Review of Systems  Constitutional: Negative for chills and fever.  Respiratory: Negative for cough.   Gastrointestinal: Negative for abdominal pain.  Neurological: Negative for dizziness.      Objective:  Physical Exam BP (!) 130/91   Pulse 99   Ht 5\' 5"  (1.651 m)   Wt 180 lb (81.6 kg)   LMP 12/27/2020 (Within Days)   BMI 29.95 kg/m  Physical Exam Vitals and nursing note reviewed.  Constitutional:      Appearance: Normal appearance.  Cardiovascular:     Rate and Rhythm: Normal rate.  Pulmonary:     Effort: Pulmonary effort is normal.  Abdominal:     Palpations: Abdomen is soft.  Neurological:     Mental Status: She is alert.  Psychiatric:        Mood and Affect: Mood normal.        Behavior: Behavior normal.       Labs and Imaging No results found for this or any previous visit (from the past 24 hour(s)).  No results found.   Assessment & Plan:  1. Encounter for surveillance of injectable contraceptive - UPT negative  - medroxyPROGESTERone (DEPO-PROVERA) injection 150 mg - f/u for next depo visit   12/29/2020, MD 01/14/2021 2:40 PM    03/16/2021, MD OB Family Medicine Fellow, [facility], [facility]

## 2021-02-11 ENCOUNTER — Other Ambulatory Visit: Payer: Self-pay

## 2021-02-11 MED FILL — Furosemide Tab 20 MG: ORAL | 30 days supply | Qty: 30 | Fill #0 | Status: AC

## 2021-02-11 MED FILL — Carvedilol Tab 6.25 MG: ORAL | 30 days supply | Qty: 60 | Fill #0 | Status: AC

## 2021-03-11 ENCOUNTER — Ambulatory Visit: Payer: Medicare Other

## 2021-03-19 ENCOUNTER — Other Ambulatory Visit: Payer: Self-pay

## 2021-03-19 MED FILL — Sacubitril-Valsartan Tab 49-51 MG: ORAL | 30 days supply | Qty: 60 | Fill #1 | Status: AC

## 2021-03-20 ENCOUNTER — Other Ambulatory Visit: Payer: Self-pay

## 2021-03-25 ENCOUNTER — Other Ambulatory Visit: Payer: Self-pay

## 2021-03-25 MED FILL — Potassium Chloride Tab ER 10 mEq: ORAL | 30 days supply | Qty: 30 | Fill #1 | Status: AC

## 2021-03-25 MED FILL — Omeprazole Cap Delayed Release 20 MG: ORAL | 30 days supply | Qty: 30 | Fill #1 | Status: AC

## 2021-04-30 ENCOUNTER — Other Ambulatory Visit: Payer: Self-pay

## 2021-04-30 MED FILL — Carvedilol Tab 6.25 MG: ORAL | 30 days supply | Qty: 60 | Fill #1 | Status: AC

## 2021-05-23 ENCOUNTER — Other Ambulatory Visit: Payer: Self-pay | Admitting: Physician Assistant

## 2021-05-23 ENCOUNTER — Other Ambulatory Visit: Payer: Self-pay

## 2021-05-23 DIAGNOSIS — K219 Gastro-esophageal reflux disease without esophagitis: Secondary | ICD-10-CM

## 2021-05-23 DIAGNOSIS — I5022 Chronic systolic (congestive) heart failure: Secondary | ICD-10-CM

## 2021-05-23 MED ORDER — POTASSIUM CHLORIDE ER 10 MEQ PO TBCR
EXTENDED_RELEASE_TABLET | Freq: Every day | ORAL | 0 refills | Status: DC
  Filled 2021-05-23: qty 30, 30d supply, fill #0

## 2021-05-23 MED ORDER — OMEPRAZOLE 20 MG PO CPDR
DELAYED_RELEASE_CAPSULE | Freq: Every day | ORAL | 2 refills | Status: DC
  Filled 2021-05-23: qty 30, 30d supply, fill #0

## 2021-05-23 MED FILL — Sacubitril-Valsartan Tab 49-51 MG: ORAL | 30 days supply | Qty: 60 | Fill #2 | Status: AC

## 2021-07-11 ENCOUNTER — Telehealth: Payer: Self-pay | Admitting: Physician Assistant

## 2021-07-11 ENCOUNTER — Other Ambulatory Visit: Payer: Self-pay | Admitting: Physician Assistant

## 2021-07-11 ENCOUNTER — Other Ambulatory Visit: Payer: Self-pay

## 2021-07-11 DIAGNOSIS — I5022 Chronic systolic (congestive) heart failure: Secondary | ICD-10-CM

## 2021-07-11 MED ORDER — FUROSEMIDE 20 MG PO TABS
ORAL_TABLET | Freq: Every day | ORAL | 0 refills | Status: DC
  Filled 2021-07-11: qty 30, 30d supply, fill #0

## 2021-07-11 NOTE — Telephone Encounter (Signed)
Requested Prescriptions  Pending Prescriptions Disp Refills  . furosemide (LASIX) 20 MG tablet 30 tablet 0    Sig: TAKE 1 TABLET (20 MG TOTAL) BY MOUTH DAILY.     Cardiovascular:  Diuretics - Loop Failed - 07/11/2021  8:41 AM      Failed - K in normal range and within 360 days    Potassium  Date Value Ref Range Status  05/01/2020 4.0 3.5 - 5.2 mmol/L Final         Failed - Ca in normal range and within 360 days    Calcium  Date Value Ref Range Status  05/01/2020 9.6 8.7 - 10.2 mg/dL Final         Failed - Na in normal range and within 360 days    Sodium  Date Value Ref Range Status  05/01/2020 140 134 - 144 mmol/L Final         Failed - Cr in normal range and within 360 days    Creat  Date Value Ref Range Status  12/05/2015 1.18 (H) 0.50 - 1.10 mg/dL Final   Creatinine, Ser  Date Value Ref Range Status  05/01/2020 1.11 (H) 0.57 - 1.00 mg/dL Final         Failed - Last BP in normal range    BP Readings from Last 1 Encounters:  01/14/21 (!) 130/91         Failed - Valid encounter within last 6 months    Recent Outpatient Visits          8 months ago Type 2 diabetes mellitus with hyperglycemia, without long-term current use of insulin [facility])   [place] [facility] [place], [place], [name]   1 year ago Sore throat   [place] [facility] [place], [place], NP   2 years ago Essential hypertension   [place] [facility] [place], [place], MD   2 years ago Essential hypertension   [place] [facility] [place], [place], MD   2 years ago Chronic systolic heart failure [facility])   [place] [facility] [place], [place], MD      Future Appointments            In 3 days [place], Kasandra Knudsen, PA-C [place] [facility]

## 2021-07-11 NOTE — Telephone Encounter (Signed)
I called and made an appt for this pt with Maurene Capes, PA-C for 07/14/2021 at 2:50 in office visit for her 6 month check up and refills. I gave her a 30 day courtesy refill on her Lasix because she has been out for 3 weeks.

## 2021-07-14 ENCOUNTER — Other Ambulatory Visit: Payer: Self-pay

## 2021-07-14 ENCOUNTER — Ambulatory Visit: Payer: Medicare Other | Attending: Physician Assistant | Admitting: Physician Assistant

## 2021-07-14 VITALS — BP 140/99 | HR 99 | Temp 98.8°F | Resp 18 | Ht 65.0 in | Wt 173.0 lb

## 2021-07-14 DIAGNOSIS — Z1211 Encounter for screening for malignant neoplasm of colon: Secondary | ICD-10-CM

## 2021-07-14 DIAGNOSIS — E119 Type 2 diabetes mellitus without complications: Secondary | ICD-10-CM

## 2021-07-14 DIAGNOSIS — Z1231 Encounter for screening mammogram for malignant neoplasm of breast: Secondary | ICD-10-CM | POA: Diagnosis not present

## 2021-07-14 DIAGNOSIS — Z6828 Body mass index (BMI) 28.0-28.9, adult: Secondary | ICD-10-CM

## 2021-07-14 DIAGNOSIS — I5022 Chronic systolic (congestive) heart failure: Secondary | ICD-10-CM | POA: Diagnosis not present

## 2021-07-14 DIAGNOSIS — F411 Generalized anxiety disorder: Secondary | ICD-10-CM | POA: Diagnosis not present

## 2021-07-14 DIAGNOSIS — E559 Vitamin D deficiency, unspecified: Secondary | ICD-10-CM | POA: Diagnosis not present

## 2021-07-14 DIAGNOSIS — E785 Hyperlipidemia, unspecified: Secondary | ICD-10-CM | POA: Diagnosis not present

## 2021-07-14 DIAGNOSIS — E1165 Type 2 diabetes mellitus with hyperglycemia: Secondary | ICD-10-CM

## 2021-07-14 DIAGNOSIS — K219 Gastro-esophageal reflux disease without esophagitis: Secondary | ICD-10-CM | POA: Diagnosis not present

## 2021-07-14 LAB — POCT GLYCOSYLATED HEMOGLOBIN (HGB A1C): Hemoglobin A1C: 6.7 % — AB (ref 4.0–5.6)

## 2021-07-14 MED ORDER — ONETOUCH DELICA PLUS LANCET33G MISC
1.0000 [IU] | Freq: Two times a day (BID) | 12 refills | Status: DC
  Filled 2021-07-14 – 2021-12-02 (×3): qty 100, 50d supply, fill #0

## 2021-07-14 MED ORDER — ONETOUCH VERIO VI STRP
1.0000 | ORAL_STRIP | Freq: Two times a day (BID) | 12 refills | Status: DC
  Filled 2021-07-14 – 2021-12-02 (×3): qty 100, 50d supply, fill #0

## 2021-07-14 MED ORDER — SACUBITRIL-VALSARTAN 49-51 MG PO TABS
1.0000 | ORAL_TABLET | Freq: Two times a day (BID) | ORAL | 2 refills | Status: DC
  Filled 2021-07-14 – 2021-10-30 (×2): qty 60, 30d supply, fill #0
  Filled 2022-01-04: qty 60, 30d supply, fill #1

## 2021-07-14 MED ORDER — BUSPIRONE HCL 10 MG PO TABS
10.0000 mg | ORAL_TABLET | Freq: Three times a day (TID) | ORAL | 0 refills | Status: DC
  Filled 2021-07-14: qty 90, 30d supply, fill #0

## 2021-07-14 MED ORDER — POTASSIUM CHLORIDE ER 10 MEQ PO TBCR
EXTENDED_RELEASE_TABLET | Freq: Every day | ORAL | 2 refills | Status: DC
  Filled 2021-07-14 – 2021-09-24 (×2): qty 30, 30d supply, fill #0
  Filled 2021-12-02: qty 30, 30d supply, fill #1

## 2021-07-14 MED ORDER — ATORVASTATIN CALCIUM 40 MG PO TABS
ORAL_TABLET | ORAL | 2 refills | Status: DC
  Filled 2021-07-14 – 2022-01-04 (×2): qty 30, 30d supply, fill #0
  Filled 2022-02-04: qty 30, 30d supply, fill #1

## 2021-07-14 MED ORDER — OMEPRAZOLE 20 MG PO CPDR
DELAYED_RELEASE_CAPSULE | Freq: Every day | ORAL | 2 refills | Status: DC
  Filled 2021-07-14 – 2021-09-18 (×2): qty 30, 30d supply, fill #0
  Filled 2021-10-30: qty 30, 30d supply, fill #1

## 2021-07-14 MED ORDER — CARVEDILOL 6.25 MG PO TABS
ORAL_TABLET | Freq: Two times a day (BID) | ORAL | 2 refills | Status: DC
  Filled 2021-07-14 – 2021-09-24 (×2): qty 60, 30d supply, fill #0
  Filled 2021-12-02: qty 60, 30d supply, fill #1

## 2021-07-14 MED ORDER — METFORMIN HCL 500 MG PO TABS
ORAL_TABLET | Freq: Every day | ORAL | 2 refills | Status: DC
  Filled 2021-07-14 – 2022-03-25 (×2): qty 30, 30d supply, fill #0
  Filled 2022-05-07: qty 30, 30d supply, fill #1

## 2021-07-14 MED ORDER — FUROSEMIDE 20 MG PO TABS
ORAL_TABLET | Freq: Every day | ORAL | 2 refills | Status: DC
  Filled 2021-07-14: qty 30, fill #0
  Filled 2021-09-18: qty 30, 30d supply, fill #0
  Filled 2021-10-30: qty 30, 30d supply, fill #1
  Filled 2021-12-02: qty 30, 30d supply, fill #2

## 2021-07-14 NOTE — Progress Notes (Signed)
Established Patient Office Visit  Subjective:  Patient ID: Sabrina Ortiz, female    DOB: [birth year]  Age: 50 y.o. MRN: [id number]  CC:  Chief Complaint  Patient presents with   Diabetes     HPI Sabrina Ortiz presents for medication refills without any concerns.  Reports that she does check her blood glucose levels at home, states that she has not had any hypoglycemic readings.  Reports that she does continue to use the BuSpar, states that she only uses it as needed with relief of elevated anxiety.  Reports that it makes her sleepy.  States that she does take it approximately 3 times a week.  Reports that she does check her blood pressure at home, states her readings are within normal limits.  No other concerns at this time   Past Medical History:  Diagnosis Date   Angio-edema    Anxiety    Chronic back pain    COPD (chronic obstructive pulmonary disease) (HCC)    Depression    Hypertension    Mental disorder    PTSD (post-traumatic stress disorder)    Urticaria     Past Surgical History:  Procedure Laterality Date   CYST REMOVAL HAND     RIGHT/LEFT HEART CATH AND CORONARY ANGIOGRAPHY N/A 04/12/2018   Procedure: RIGHT/LEFT HEART CATH AND CORONARY ANGIOGRAPHY;  Surgeon: Dixie Dials, MD;  Location: [facility];  Service: Cardiovascular;  Laterality: N/A;    Family History  Problem Relation Age of Onset   Breast cancer Mother    Breast cancer Maternal Aunt    Breast cancer Maternal Grandmother     Social History   Socioeconomic History   Marital status: Single    Spouse name: Not on file   Number of children: Not on file   Years of education: Not on file   Highest education level: Not on file  Occupational History   Not on file  Tobacco Use   Smoking status: Never   Smokeless tobacco: Never  Vaping Use   Vaping Use: Never used  Substance and Sexual Activity   Alcohol use: No   Drug use: No   Sexual activity: Not Currently  Other  Topics Concern   Not on file  Social History Narrative   Not on file   Social Determinants of Health   Financial Resource Strain: Not on file  Food Insecurity: Food Insecurity Present   Worried About [place] in the Last Year: Sometimes true   [place] in the Last Year: Sometimes true  Transportation Needs: No Transportation Needs   Lack of Transportation (Medical): No   Lack of Transportation (Non-Medical): No  Physical Activity: Not on file  Stress: Not on file  Social Connections: Not on file  Intimate Partner Violence: Not on file    Outpatient Medications Prior to Visit  Medication Sig Dispense Refill   aspirin EC 81 MG EC tablet Take 1 tablet (81 mg total) by mouth daily.     Blood Glucose Monitoring Suppl (ONETOUCH VERIO) w/Device KIT 1 Units by Does not apply route in the morning and at bedtime. 1 kit 0   atorvastatin (LIPITOR) 40 MG tablet TAKE 1 TABLET (40 MG TOTAL) BY MOUTH DAILY AT 6 PM. OR LATER/AFTER EVENING MEAL 30 tablet 2   busPIRone (BUSPAR) 10 MG tablet Take 1 tablet (10 mg total) by mouth 3 (three) times daily. To treat anxiety 90 tablet 1   carvedilol (COREG) 6.25 MG  tablet TAKE 1 TABLET (6.25 MG TOTAL) BY MOUTH 2 (TWO) TIMES DAILY WITH A MEAL. 60 tablet 2   furosemide (LASIX) 20 MG tablet TAKE 1 TABLET (20 MG TOTAL) BY MOUTH DAILY. keep upcoming appt for refills 30 tablet 0   glucose blood (ONETOUCH VERIO) test strip Use as instructed (Patient not taking: Reported on 01/14/2021) 100 each 12   Lancets (ONETOUCH DELICA PLUS [id number]) MISC 1 Units by Does not apply route in the morning and at bedtime. (Patient not taking: Reported on 01/14/2021) 100 each 12   metFORMIN (GLUCOPHAGE) 500 MG tablet TAKE 1 TABLET (500 MG TOTAL) BY MOUTH DAILY WITH BREAKFAST. (Patient not taking: Reported on 01/14/2021) 30 tablet 2   omeprazole (PRILOSEC) 20 MG capsule TAKE 1 CAPSULE (20 MG TOTAL) BY MOUTH DAILY. 30 capsule 2   potassium chloride (KLOR-CON) 10 MEQ tablet  TAKE 1 TABLET (10 MEQ TOTAL) BY MOUTH DAILY. 30 tablet 0   sacubitril-valsartan (ENTRESTO) 49-51 MG TAKE 1 TABLET BY MOUTH 2 (TWO) TIMES DAILY. 60 tablet 2   No facility-administered medications prior to visit.    Allergies  Allergen Reactions   Shellfish Allergy Anaphylaxis    ROS Review of Systems  Constitutional: Negative.   HENT: Negative.    Eyes: Negative.   Respiratory:  Negative for shortness of breath.   Cardiovascular:  Negative for chest pain.  Gastrointestinal: Negative.   Endocrine: Negative.   Genitourinary: Negative.   Musculoskeletal: Negative.   Skin: Negative.   Allergic/Immunologic: Negative.   Neurological: Negative.   Hematological: Negative.   Psychiatric/Behavioral: Negative.       Objective:    Physical Exam Vitals and nursing note reviewed.  HENT:     Head: Normocephalic and atraumatic.     Right Ear: External ear normal.     Left Ear: External ear normal.     Nose: Nose normal.     Mouth/Throat:     Mouth: Mucous membranes are moist.     Pharynx: Oropharynx is clear.  Eyes:     Extraocular Movements: Extraocular movements intact.     Conjunctiva/sclera: Conjunctivae normal.     Pupils: Pupils are equal, round, and reactive to light.  Cardiovascular:     Rate and Rhythm: Normal rate and regular rhythm.     Pulses: Normal pulses.     Heart sounds: Normal heart sounds.  Pulmonary:     Effort: Pulmonary effort is normal.     Breath sounds: Normal breath sounds.  Musculoskeletal:        General: Normal range of motion.     Cervical back: Normal range of motion and neck supple.  Skin:    General: Skin is warm and dry.  Neurological:     General: No focal deficit present.     Mental Status: She is alert and oriented to person, place, and time.  Psychiatric:        Mood and Affect: Mood normal.        Behavior: Behavior normal.        Thought Content: Thought content normal.        Judgment: Judgment normal.    BP (!) 140/99 (BP  Location: Right Arm, Patient Position: Sitting, Cuff Size: Normal)   Pulse 99   Temp 98.8 F (37.1 C) (Oral)   Resp 18   Ht '5\' 5"'  (1.651 m)   Wt 173 lb (78.5 kg)   SpO2 99%   BMI 28.79 kg/m  Wt Readings from Last 3 Encounters:  07/14/21  173 lb (78.5 kg)  01/14/21 180 lb (81.6 kg)  06/04/20 180 lb 3.2 oz (81.7 kg)     Health Maintenance Due  Topic Date Due   COVID-19 Vaccine (1) Never done   Pneumococcal Vaccine 37-39 Years old (1 - PCV) Never done   FOOT EXAM  Never done   OPHTHALMOLOGY EXAM  Never done   TETANUS/TDAP  Never done   Zoster Vaccines- Shingrix (1 of 2) Never done   COLONOSCOPY (Pts 45-37yr Insurance coverage will need to be confirmed)  Never done   MAMMOGRAM  03/05/2021   INFLUENZA VACCINE  Never done    There are no preventive care reminders to display for this patient.  Lab Results  Component Value Date   TSH 1.670 11/11/2018   Lab Results  Component Value Date   WBC 6.8 05/01/2020   HGB 14.9 05/01/2020   HCT 44.0 05/01/2020   MCV 83 05/01/2020   PLT 134 (L) 05/01/2020   Lab Results  Component Value Date   NA 140 05/01/2020   K 4.0 05/01/2020   CO2 21 01/26/2020   GLUCOSE 230 (H) 05/01/2020   BUN 8 05/01/2020   CREATININE 1.11 (H) 05/01/2020   BILITOT 0.6 05/01/2020   ALKPHOS 68 05/01/2020   AST 64 (H) 05/01/2020   ALT 62 (H) 01/26/2020   PROT 7.6 05/01/2020   ALBUMIN 4.8 05/01/2020   CALCIUM 9.6 05/01/2020   ANIONGAP 8 09/18/2018   Lab Results  Component Value Date   CHOL 297 (H) 01/26/2020   Lab Results  Component Value Date   HDL 58 01/26/2020   Lab Results  Component Value Date   LDLCALC 215 (H) 01/26/2020   Lab Results  Component Value Date   TRIG 132 01/26/2020   Lab Results  Component Value Date   CHOLHDL 5.1 (H) 01/26/2020   Lab Results  Component Value Date   HGBA1C 6.7 (A) 07/14/2021      Assessment & Plan:   Problem List Items Addressed This Visit       Cardiovascular and Mediastinum   Systolic  heart failure (HCC)   Relevant Medications   atorvastatin (LIPITOR) 40 MG tablet   carvedilol (COREG) 6.25 MG tablet   furosemide (LASIX) 20 MG tablet   potassium chloride (KLOR-CON) 10 MEQ tablet   sacubitril-valsartan (ENTRESTO) 49-51 MG     Digestive   Gastroesophageal reflux disease without esophagitis   Relevant Medications   omeprazole (PRILOSEC) 20 MG capsule     Endocrine   Type 2 diabetes mellitus with hyperglycemia, without long-term current use of insulin (HCC) - Primary   Relevant Medications   atorvastatin (LIPITOR) 40 MG tablet   metFORMIN (GLUCOPHAGE) 500 MG tablet   glucose blood (ONETOUCH VERIO) test strip   Lancets (ONETOUCH DELICA PLUS [id number] MISC     Other   GAD (generalized anxiety disorder)   Relevant Medications   busPIRone (BUSPAR) 10 MG tablet   Dyslipidemia   Relevant Medications   atorvastatin (LIPITOR) 40 MG tablet   Other Relevant Orders   Lipid panel   Vitamin D deficiency   Relevant Orders   Vitamin D, 25-hydroxy   BMI 28.0-28.9,adult   Other Visit Diagnoses     Encounter for screening mammogram for malignant neoplasm of breast       Relevant Orders   MM DIGITAL SCREENING BILATERAL   Screen for colon cancer       Relevant Orders   Ambulatory referral to Gastroenterology  Meds ordered this encounter  Medications   atorvastatin (LIPITOR) 40 MG tablet    Sig: TAKE 1 TABLET (40 MG TOTAL) BY MOUTH DAILY AT 6 PM. OR LATER/AFTER EVENING MEAL    Dispense:  30 tablet    Refill:  2    Order Specific Question:   Supervising Provider    Answer:   Asencion Noble E [[id number]]   busPIRone (BUSPAR) 10 MG tablet    Sig: Take 1 tablet (10 mg total) by mouth 3 (three) times daily. To treat anxiety    Dispense:  90 tablet    Refill:  0    Order Specific Question:   Supervising Provider    Answer:   Asencion Noble E [[id number]]   carvedilol (COREG) 6.25 MG tablet    Sig: TAKE 1 TABLET (6.25 MG TOTAL) BY MOUTH 2 (TWO) TIMES DAILY WITH A MEAL.     Dispense:  60 tablet    Refill:  2    Order Specific Question:   Supervising Provider    Answer:   Asencion Noble E [[id number]]   furosemide (LASIX) 20 MG tablet    Sig: TAKE 1 TABLET (20 MG TOTAL) BY MOUTH DAILY. keep upcoming appt for refills    Dispense:  30 tablet    Refill:  2    Order Specific Question:   Supervising Provider    Answer:   Asencion Noble E [[id number]]   metFORMIN (GLUCOPHAGE) 500 MG tablet    Sig: TAKE 1 TABLET (500 MG TOTAL) BY MOUTH DAILY WITH BREAKFAST.    Dispense:  30 tablet    Refill:  2    Order Specific Question:   Supervising Provider    Answer:   Asencion Noble E [[id number]]   potassium chloride (KLOR-CON) 10 MEQ tablet    Sig: TAKE 1 TABLET (10 MEQ TOTAL) BY MOUTH DAILY.    Dispense:  30 tablet    Refill:  2    Order Specific Question:   Supervising Provider    Answer:   Joya Gaskins, PATRICK E [[id number]]   sacubitril-valsartan (ENTRESTO) 49-51 MG    Sig: TAKE 1 TABLET BY MOUTH 2 (TWO) TIMES DAILY.    Dispense:  60 tablet    Refill:  2    Order Specific Question:   Supervising Provider    Answer:   Asencion Noble E [[id number]]   omeprazole (PRILOSEC) 20 MG capsule    Sig: TAKE 1 CAPSULE (20 MG TOTAL) BY MOUTH DAILY.    Dispense:  30 capsule    Refill:  2    Order Specific Question:   Supervising Provider    Answer:   Joya Gaskins, PATRICK E [[id number]]   glucose blood (ONETOUCH VERIO) test strip    Sig: 1 each by Other route 2 (two) times daily at 8 am and 10 pm. Use as instructed    Dispense:  100 each    Refill:  12    Order Specific Question:   Supervising Provider    Answer:   Asencion Noble E [[id number]]   Lancets (ONETOUCH DELICA PLUS [id number]) MISC    Sig: 1 Units by Does not apply route in the morning and at bedtime.    Dispense:  100 each    Refill:  12    Order Specific Question:   Supervising Provider    Answer:   Joya Gaskins, PATRICK E [[id number]]  1. Type 2 diabetes mellitus without complication, without long-term current use of insulin (HCC) A1c 6.7.  Patient  encouraged to  continue checking blood glucose levels at home, keeping a written log and having available for all office visits.  Patient encouraged to return for fasting labs.   - POCT A1C - metFORMIN (GLUCOPHAGE) 500 MG tablet; TAKE 1 TABLET (500 MG TOTAL) BY MOUTH DAILY WITH BREAKFAST.  Dispense: 30 tablet; Refill: 2 - glucose blood (ONETOUCH VERIO) test strip; 1 each by Other route 2 (two) times daily at 8 am and 10 pm. Use as instructed  Dispense: 100 each; Refill: 12 - Lancets (ONETOUCH DELICA PLUS [id number]) MISC; 1 Units by Does not apply route in the morning and at bedtime.  Dispense: 100 each; Refill: 12 - CBC with Differential/Platelet; Future - Comp. Metabolic Panel (12); Future - TSH; Future - Microalbumin / creatinine urine ratio; Future  2. Dyslipidemia Continue current regimen - atorvastatin (LIPITOR) 40 MG tablet; TAKE 1 TABLET (40 MG TOTAL) BY MOUTH DAILY AT 6 PM. OR LATER/AFTER EVENING MEAL  Dispense: 30 tablet; Refill: 2 - Lipid panel; Future  3. GAD (generalized anxiety disorder) Continue current regimen - busPIRone (BUSPAR) 10 MG tablet; Take 1 tablet (10 mg total) by mouth 3 (three) times daily. To treat anxiety  Dispense: 90 tablet; Refill: 0  4. Chronic systolic heart failure (HCC) Continue current regimen - carvedilol (COREG) 6.25 MG tablet; TAKE 1 TABLET (6.25 MG TOTAL) BY MOUTH 2 (TWO) TIMES DAILY WITH A MEAL.  Dispense: 60 tablet; Refill: 2 - furosemide (LASIX) 20 MG tablet; TAKE 1 TABLET (20 MG TOTAL) BY MOUTH DAILY. keep upcoming appt for refills  Dispense: 30 tablet; Refill: 2 - potassium chloride (KLOR-CON) 10 MEQ tablet; TAKE 1 TABLET (10 MEQ TOTAL) BY MOUTH DAILY.  Dispense: 30 tablet; Refill: 2 - sacubitril-valsartan (ENTRESTO) 49-51 MG; TAKE 1 TABLET BY MOUTH 2 (TWO) TIMES DAILY.  Dispense: 60 tablet; Refill: 2  5. Gastroesophageal reflux disease without esophagitis Continue current regimen - omeprazole (PRILOSEC) 20 MG capsule; TAKE 1 CAPSULE (20 MG  TOTAL) BY MOUTH DAILY.  Dispense: 30 capsule; Refill: 2  6. Vitamin D deficiency  - Vitamin D, 25-hydroxy; Future  7. Encounter for screening mammogram for malignant neoplasm of breast  - MM DIGITAL SCREENING BILATERAL; Future  8. Screen for colon cancer  - Ambulatory referral to Gastroenterology  9. BMI 28.0-28.9,adult    I have reviewed the patient's medical history (PMH, PSH, Social History, Family History, Medications, and allergies) , and have been updated if relevant. I spent 30 minutes reviewing chart and  face to face time with patient.     Follow-up: Return in about 1 day (around 07/15/2021) for Fasting  labs, At [facility] [place].    Loraine Grip Mayers, PA-C

## 2021-07-14 NOTE — Patient Instructions (Signed)
Please return for fasting labs in the morning, we will call you with results when they are available.  Please let us know if there is anything else we can do for you  Sabrina Jaffe, PA-C Physician Assistant [facility] [email or web address]   Health Maintenance, Female Adopting a healthy lifestyle and getting preventive care are important in promoting health and wellness. Ask your health care provider about: The right schedule for you to have regular tests and exams. Things you can do on your own to prevent diseases and keep yourself healthy. What should I know about diet, weight, and exercise? Eat a healthy diet  Eat a diet that includes plenty of vegetables, fruits, low-fat dairy products, and lean protein. Do not eat a lot of foods that are high in solid fats, added sugars, or sodium. Maintain a healthy weight Body mass index (BMI) is used to identify weight problems. It estimates body fat based on height and weight. Your health care provider can help determine your BMI and help you achieve or maintain a healthy weight. Get regular exercise Get regular exercise. This is one of the most important things you can do for your health. Most adults should: Exercise for at least 150 minutes each week. The exercise should increase your heart rate and make you sweat (moderate-intensity exercise). Do strengthening exercises at least twice a week. This is in addition to the moderate-intensity exercise. Spend less time sitting. Even light physical activity can be beneficial. Watch cholesterol and blood lipids Have your blood tested for lipids and cholesterol at 50 years of age, then have this test every 5 years. Have your cholesterol levels checked more often if: Your lipid or cholesterol levels are high. You are older than 50 years of age. You are at high risk for heart disease. What should I know about cancer screening? Depending on your  health history and family history, you may need to have cancer screening at various ages. This may include screening for: Breast cancer. Cervical cancer. Colorectal cancer. Skin cancer. Lung cancer. What should I know about heart disease, diabetes, and high blood pressure? Blood pressure and heart disease High blood pressure causes heart disease and increases the risk of stroke. This is more likely to develop in people who have high blood pressure readings or are overweight. Have your blood pressure checked: Every 3-5 years if you are 66-51 years of age. Every year if you are 38 years old or older. Diabetes Have regular diabetes screenings. This checks your fasting blood sugar level. Have the screening done: Once every three years after age 34 if you are at a normal weight and have a low risk for diabetes. More often and at a younger age if you are overweight or have a high risk for diabetes. What should I know about preventing infection? Hepatitis B If you have a higher risk for hepatitis B, you should be screened for this virus. Talk with your health care provider to find out if you are at risk for hepatitis B infection. Hepatitis C Testing is recommended for: Everyone born from [birth year] through 1965. Anyone with known risk factors for hepatitis C. Sexually transmitted infections (STIs) Get screened for STIs, including gonorrhea and chlamydia, if: You are sexually active and are younger than 50 years of age. You are older than 50 years of age and your health care provider tells you that you are at risk for this type of infection. Your sexual activity has changed since you were last screened,  and you are at increased risk for chlamydia or gonorrhea. Ask your health care provider if you are at risk. Ask your health care provider about whether you are at high risk for HIV. Your health care provider may recommend a prescription medicine to help prevent HIV infection. If you choose to take  medicine to prevent HIV, you should first get tested for HIV. You should then be tested every 3 months for as long as you are taking the medicine. Pregnancy If you are about to stop having your period (premenopausal) and you may become pregnant, seek counseling before you get pregnant. Take 400 to 800 micrograms (mcg) of folic acid every day if you become pregnant. Ask for birth control (contraception) if you want to prevent pregnancy. Osteoporosis and menopause Osteoporosis is a disease in which the bones lose minerals and strength with aging. This can result in bone fractures. If you are 80 years old or older, or if you are at risk for osteoporosis and fractures, ask your health care provider if you should: Be screened for bone loss. Take a calcium or vitamin D supplement to lower your risk of fractures. Be given hormone replacement therapy (HRT) to treat symptoms of menopause. Follow these instructions at home: Alcohol use Do not drink alcohol if: Your health care provider tells you not to drink. You are pregnant, may be pregnant, or are planning to become pregnant. If you drink alcohol: Limit how much you have to: 0-1 drink a day. Know how much alcohol is in your drink. In the [place] one drink equals one 12 oz bottle of beer (355 mL), one 5 oz glass of wine (148 mL), or one 1 oz glass of hard liquor (44 mL). Lifestyle Do not use any products that contain nicotine or tobacco. These products include cigarettes, chewing tobacco, and vaping devices, such as e-cigarettes. If you need help quitting, ask your health care provider. Do not use street drugs. Do not share needles. Ask your health care provider for help if you need support or information about quitting drugs. General instructions Schedule regular health, dental, and eye exams. Stay current with your vaccines. Tell your health care provider if: You often feel depressed. You have ever been abused or do not feel safe at  home. Summary Adopting a healthy lifestyle and getting preventive care are important in promoting health and wellness. Follow your health care provider's instructions about healthy diet, exercising, and getting tested or screened for diseases. Follow your health care provider's instructions on monitoring your cholesterol and blood pressure. This information is not intended to replace advice given to you by your health care provider. Make sure you discuss any questions you have with your health care provider. Document Revised: 01/13/2021 Document Reviewed: 01/13/2021 Elsevier Patient Education  2022 [employer].

## 2021-07-14 NOTE — Progress Notes (Signed)
Patient needs refills on carvedilol, DM supplies and other chronic medications. Patient denies pain at this time.

## 2021-07-16 ENCOUNTER — Encounter: Payer: Self-pay | Admitting: Physician Assistant

## 2021-07-16 DIAGNOSIS — Z6828 Body mass index (BMI) 28.0-28.9, adult: Secondary | ICD-10-CM | POA: Insufficient documentation

## 2021-09-18 ENCOUNTER — Other Ambulatory Visit: Payer: Self-pay

## 2021-09-24 ENCOUNTER — Other Ambulatory Visit: Payer: Self-pay

## 2021-10-30 ENCOUNTER — Other Ambulatory Visit: Payer: Self-pay

## 2021-12-02 ENCOUNTER — Other Ambulatory Visit: Payer: Self-pay | Admitting: Pharmacist

## 2021-12-02 ENCOUNTER — Other Ambulatory Visit (HOSPITAL_COMMUNITY): Payer: Self-pay

## 2021-12-02 ENCOUNTER — Other Ambulatory Visit: Payer: Self-pay

## 2021-12-02 DIAGNOSIS — E119 Type 2 diabetes mellitus without complications: Secondary | ICD-10-CM

## 2021-12-02 MED ORDER — ONETOUCH DELICA PLUS LANCET33G MISC: 0 refills | Status: DC

## 2021-12-02 MED ORDER — ONETOUCH VERIO VI STRP: ORAL_STRIP | 0 refills | Status: DC

## 2021-12-03 ENCOUNTER — Other Ambulatory Visit (HOSPITAL_COMMUNITY): Payer: Self-pay

## 2022-01-04 ENCOUNTER — Other Ambulatory Visit: Payer: Self-pay | Admitting: Physician Assistant

## 2022-01-04 DIAGNOSIS — I5022 Chronic systolic (congestive) heart failure: Secondary | ICD-10-CM

## 2022-01-04 DIAGNOSIS — K219 Gastro-esophageal reflux disease without esophagitis: Secondary | ICD-10-CM

## 2022-01-05 ENCOUNTER — Other Ambulatory Visit (HOSPITAL_COMMUNITY): Payer: Self-pay

## 2022-01-06 ENCOUNTER — Other Ambulatory Visit (HOSPITAL_COMMUNITY): Payer: Self-pay

## 2022-01-19 ENCOUNTER — Ambulatory Visit: Payer: Medicare Other | Admitting: Obstetrics & Gynecology

## 2022-01-23 ENCOUNTER — Other Ambulatory Visit: Payer: Self-pay

## 2022-01-23 ENCOUNTER — Other Ambulatory Visit: Payer: Self-pay | Admitting: Physician Assistant

## 2022-01-23 DIAGNOSIS — I5022 Chronic systolic (congestive) heart failure: Secondary | ICD-10-CM

## 2022-01-27 ENCOUNTER — Other Ambulatory Visit (HOSPITAL_COMMUNITY): Payer: Self-pay

## 2022-01-27 ENCOUNTER — Other Ambulatory Visit: Payer: Self-pay | Admitting: Physician Assistant

## 2022-01-27 DIAGNOSIS — I5022 Chronic systolic (congestive) heart failure: Secondary | ICD-10-CM

## 2022-01-27 DIAGNOSIS — K219 Gastro-esophageal reflux disease without esophagitis: Secondary | ICD-10-CM

## 2022-01-29 ENCOUNTER — Other Ambulatory Visit (HOSPITAL_COMMUNITY): Payer: Self-pay

## 2022-01-29 ENCOUNTER — Other Ambulatory Visit: Payer: Self-pay | Admitting: Physician Assistant

## 2022-01-29 DIAGNOSIS — K219 Gastro-esophageal reflux disease without esophagitis: Secondary | ICD-10-CM

## 2022-01-29 DIAGNOSIS — I5022 Chronic systolic (congestive) heart failure: Secondary | ICD-10-CM

## 2022-02-04 ENCOUNTER — Other Ambulatory Visit: Payer: Self-pay | Admitting: Physician Assistant

## 2022-02-04 ENCOUNTER — Other Ambulatory Visit (HOSPITAL_COMMUNITY): Payer: Self-pay

## 2022-02-04 DIAGNOSIS — I5022 Chronic systolic (congestive) heart failure: Secondary | ICD-10-CM

## 2022-02-04 DIAGNOSIS — K219 Gastro-esophageal reflux disease without esophagitis: Secondary | ICD-10-CM

## 2022-02-05 ENCOUNTER — Other Ambulatory Visit (HOSPITAL_COMMUNITY): Payer: Self-pay

## 2022-02-13 ENCOUNTER — Other Ambulatory Visit: Payer: Self-pay | Admitting: Physician Assistant

## 2022-02-13 ENCOUNTER — Other Ambulatory Visit: Payer: Self-pay

## 2022-02-13 ENCOUNTER — Ambulatory Visit: Payer: Self-pay | Admitting: *Deleted

## 2022-02-13 DIAGNOSIS — I5022 Chronic systolic (congestive) heart failure: Secondary | ICD-10-CM

## 2022-02-13 DIAGNOSIS — F411 Generalized anxiety disorder: Secondary | ICD-10-CM

## 2022-02-13 DIAGNOSIS — K219 Gastro-esophageal reflux disease without esophagitis: Secondary | ICD-10-CM

## 2022-02-13 MED ORDER — FUROSEMIDE 20 MG PO TABS
ORAL_TABLET | Freq: Every day | ORAL | 0 refills | Status: DC
  Filled 2022-02-13: qty 30, 30d supply, fill #0

## 2022-02-13 MED ORDER — BUSPIRONE HCL 10 MG PO TABS
10.0000 mg | ORAL_TABLET | Freq: Three times a day (TID) | ORAL | 0 refills | Status: DC
  Filled 2022-02-13: qty 90, 30d supply, fill #0

## 2022-02-13 MED ORDER — SACUBITRIL-VALSARTAN 49-51 MG PO TABS
1.0000 | ORAL_TABLET | Freq: Two times a day (BID) | ORAL | 2 refills | Status: DC
  Filled 2022-02-13: qty 60, 30d supply, fill #0
  Filled 2022-03-25: qty 60, 30d supply, fill #1
  Filled 2022-05-07: qty 60, 30d supply, fill #2

## 2022-02-13 MED ORDER — CARVEDILOL 6.25 MG PO TABS
ORAL_TABLET | Freq: Two times a day (BID) | ORAL | 0 refills | Status: DC
  Filled 2022-02-13: qty 60, 30d supply, fill #0

## 2022-02-13 MED ORDER — POTASSIUM CHLORIDE ER 10 MEQ PO TBCR
EXTENDED_RELEASE_TABLET | Freq: Every day | ORAL | 0 refills | Status: DC
  Filled 2022-02-13: qty 30, 30d supply, fill #0

## 2022-02-13 MED ORDER — OMEPRAZOLE 20 MG PO CPDR
DELAYED_RELEASE_CAPSULE | Freq: Every day | ORAL | 0 refills | Status: DC
  Filled 2022-02-13: qty 30, 30d supply, fill #0

## 2022-02-13 NOTE — Telephone Encounter (Signed)
  Chief Complaint: Med refills Symptoms: Positive Covid 1 week ago. States "Better just need my meds." States more fatigued, SOB with exertion, "Because I don't have my meds." States last had meds "Over a month ago." Agent routing refill requests, pt mentioned SOB and covid, sent to triage. Frequency:  Pertinent Negatives: Patient denies fever, cough Disposition: []ED /[]Urgent Care (no appt availability in office) / []Appointment(In office/virtual)/ [] [facility]/ [x]Home Care/ []Refused Recommended Disposition /[][facility]/ [] Follow-up with PCP Additional Notes: Will F/U on refill request Reason for Disposition  [1] COVID-19 diagnosed by positive lab test (e.g., PCR, rapid self-test kit) AND [2] mild symptoms (e.g., cough, fever, others) AND [1] no complications or SOB  Answer Assessment - Initial Assessment Questions 1. COVID-19 DIAGNOSIS: "Who made your COVID-19 diagnosis?" "Was it confirmed by a positive lab test or self-test?" If not diagnosed by a doctor (or NP/PA), ask "Are there lots of cases (community spread) where you live?" Note: See public health department website, if unsure.      2. COVID-19 EXPOSURE: "Was there any known exposure to COVID before the symptoms began?" CDC Definition of close contact: within 6 feet (2 meters) for a total of 15 minutes or more over a 24-hour period.      YEs 3. ONSET: "When did the COVID-19 symptoms start?"      Positive 1 week ago  4. WORST SYMPTOM: "What is your worst symptom?" (e.g., cough, fever, shortness of breath, muscle aches)      5. COUGH: "Do you have a cough?" If Yes, ask: "How bad is the cough?"       No 6. FEVER: "Do you have a fever?" If Yes, ask: "What is your temperature, how was it measured, and when did it start?"     No 7. RESPIRATORY STATUS: "Describe your breathing?" (e.g., shortness of breath, wheezing, unable to speak)      "Better" 8. BETTER-SAME-WORSE: "Are you getting better, staying the same  or getting worse compared to yesterday?"  If getting worse, ask, "In what way?"     "Better" 9. HIGH RISK DISEASE: "Do you have any chronic medical problems?" (e.g., asthma, heart or lung disease, weak immune system, obesity, etc.)      10. VACCINE: "Have you had the COVID-19 vaccine?" If Yes, ask: "Which one, how many shots, when did you get it?"        11. BOOSTER: "Have you received your COVID-19 booster?" If Yes, ask: "Which one and when did you get it?"        13. OTHER SYMPTOMS: "Do you have any other symptoms?"  (e.g., chills, fatigue, headache, loss of smell or taste, muscle pain, sore throat)    Fatigue 14. O2 SATURATION MONITOR:  "Do you use an oxygen saturation monitor (pulse oximeter) at home?" If Yes, ask "What is your reading (oxygen level) today?" "What is your usual oxygen saturation reading?" (e.g., 95%)  Protocols used: Coronavirus (COVID-19) Diagnosed or Suspected-A-AH

## 2022-02-13 NOTE — Telephone Encounter (Signed)
Requested medication (s) are due for refill today:Yes, al  Requested medication (s) are on the active medication list: yes    Last refill: All meds 07/14/21 with 3 month supply  Future visit scheduled yes 03/18/22  Notes to clinic:Historical provider, please review. Pt requests meds be sent to [facility], advised they could be sent out from there.   Requested Prescriptions  Pending Prescriptions Disp Refills   sacubitril-valsartan (ENTRESTO) 49-51 MG 60 tablet 2    Sig: TAKE 1 TABLET BY MOUTH 2 (TWO) TIMES DAILY.     Off-Protocol Failed - 02/13/2022 12:11 PM      Failed - Medication not assigned to a protocol, review manually.      Passed - Valid encounter within last 12 months    Recent Outpatient Visits           7 months ago Type 2 diabetes mellitus without complication, without long-term current use of insulin (HCC)   [place] [facility] Mayers, [name], PA-C   1 year ago Type 2 diabetes mellitus with hyperglycemia, without long-term current use of insulin [facility])   [facility] [place], [place], [name]   2 years ago Sore throat   [place] [place], [place], NP   2 years ago Essential hypertension   [place] [facility] F[place], [place], MD   3 years ago Essential hypertension   [place] [facility] [place], [place], MD       Future Appointments             In 1 month [place], Marzella Schlein, [name] [facility]             carvedilol (COREG) 6.25 MG tablet 60 tablet 2    Sig: TAKE 1 TABLET (6.25 MG TOTAL) BY MOUTH 2 (TWO) TIMES DAILY WITH A MEAL.     Cardiovascular: Beta Blockers 3 Failed - 02/13/2022 12:11 PM      Failed - Cr in normal range and within 360 days    Creat  Date Value Ref Range Status  12/05/2015 1.18 (H) 0.50 - 1.10 mg/dL Final   Creatinine, Ser  Date Value Ref Range Status  05/01/2020 1.11 (H) 0.57 - 1.00 mg/dL Final          Failed - AST in normal range and within 360 days    AST  Date Value Ref Range Status  05/01/2020 64 (H) 0 - 40 IU/L Final         Failed - ALT in normal range and within 360 days    ALT  Date Value Ref Range Status  01/26/2020 62 (H) 0 - 32 IU/L Final         Failed - Last BP in normal range    BP Readings from Last 1 Encounters:  07/14/21 (!) 140/99         Failed - Valid encounter within last 6 months    Recent Outpatient Visits           7 months ago Type 2 diabetes mellitus without complication, without long-term current use of insulin (HCC)    [facility] [place], [place], PA-C   1 year ago Type 2 diabetes mellitus with hyperglycemia, without long-term current use of insulin [facility])   [facility] [place], [place], [name]   2 years ago Sore throat     [facility] [place], [place] J, NP   2 years ago Essential hypertension   [place] [facility] [place], [place], MD   3 years ago Essential hypertension   [place] [facility] [place], [place], MD       Future Appointments             In 1 month [place], Marzella Schlein, [name] [facility]            Passed - Last Heart Rate in normal range    Pulse Readings from Last 1 Encounters:  07/14/21 99          omeprazole (PRILOSEC) 20 MG capsule 30 capsule 2    Sig: TAKE 1 CAPSULE (20 MG TOTAL) BY MOUTH DAILY.     Gastroenterology: Proton Pump Inhibitors Passed - 02/13/2022 12:11 PM      Passed - Valid encounter within last 12 months    Recent Outpatient Visits           7 months ago Type 2 diabetes mellitus without complication, without long-term current use of insulin (HCC)   [place] [address] [facility] Mayers, [name], PA-C   1 year ago Type 2 diabetes mellitus with hyperglycemia, without long-term current use of insulin [facility])   [facility]  [facility] [place], [place], [name]   2 years ago Sore throat   [place] [facility] [place], [place], NP   2 years ago Essential hypertension   [place] [facility] F[place], [place], MD   3 years ago Essential hypertension   [place] [facility] [place], [place], MD       Future Appointments             In 1 month [place], Marzella Schlein, [name] [facility]             potassium chloride (KLOR-CON) 10 MEQ tablet 30 tablet 2    Sig: TAKE 1 TABLET (10 MEQ TOTAL) BY MOUTH DAILY.     Endocrinology:  Minerals - Potassium Supplementation Failed - 02/13/2022 12:11 PM      Failed - K in normal range and within 360 days    Potassium  Date Value Ref Range Status  05/01/2020 4.0 3.5 - 5.2 mmol/L Final         Failed - Cr in normal range and within 360 days    Creat  Date Value Ref Range Status  12/05/2015 1.18 (H) 0.50 - 1.10 mg/dL Final   Creatinine, Ser  Date Value Ref Range Status  05/01/2020 1.11 (H) 0.57 - 1.00 mg/dL Final         Passed - Valid encounter within last 12 months    Recent Outpatient Visits           7 months ago Type 2 diabetes mellitus without complication, without long-term current use of insulin (HCC)   [place] [facility] Mayers, [name], PA-C   1 year ago Type 2 diabetes mellitus with hyperglycemia, without long-term current use of insulin [facility])   [facility] [place], [place], [name]   2 years ago Sore throat   [place] [facility] [place], [place], NP   2 years ago Essential hypertension   [place] [facility] [place], [place], MD   3 years ago Essential hypertension  [facility] [place], [place], MD       Future Appointments             In 1 month [place], Marzella SchleinAngela M, [place] [facility]             furosemide (LASIX) 20  MG tablet 30 tablet 2    Sig: TAKE 1 TABLET (20 MG TOTAL) BY MOUTH DAILY. keep upcoming appt for refills     Cardiovascular:  Diuretics - Loop Failed - 02/13/2022 12:11 PM      Failed - K in normal range and within 180 days    Potassium  Date Value Ref Range Status  05/01/2020 4.0 3.5 - 5.2 mmol/L Final         Failed - Ca in normal range and within 180 days    Calcium  Date Value Ref Range Status  05/01/2020 9.6 8.7 - 10.2 mg/dL Final         Failed - Na in normal range and within 180 days    Sodium  Date Value Ref Range Status  05/01/2020 140 134 - 144 mmol/L Final         Failed - Cr in normal range and within 180 days    Creat  Date Value Ref Range Status  12/05/2015 1.18 (H) 0.50 - 1.10 mg/dL Final   Creatinine, Ser  Date Value Ref Range Status  05/01/2020 1.11 (H) 0.57 - 1.00 mg/dL Final         Failed - Cl in normal range and within 180 days    Chloride  Date Value Ref Range Status  05/01/2020 103 96 - 106 mmol/L Final         Failed - Mg Level in normal range and within 180 days    Magnesium  Date Value Ref Range Status  01/26/2020 2.0 1.6 - 2.3 mg/dL Final         Failed - Last BP in normal range    BP Readings from Last 1 Encounters:  07/14/21 (!) 140/99         Failed - Valid encounter within last 6 months    Recent Outpatient Visits           7 months ago Type 2 diabetes mellitus without complication, without long-term current use of insulin (HCC)   [place] [facility], [name], PA-C   1 year ago Type 2 diabetes mellitus with hyperglycemia, without long-term current use of insulin [facility](HCC)   [place] [facility] [place], [place] M, [place]   2 years ago Sore throat   [place] [facility] [place], [place] J, NP   2 years ago Essential hypertension   [place] [facility] [place], [place], MD   3 years ago Essential hypertension   [place] [facility]  [place], [place], MD       Future Appointments             In 1 month [place], Marzella SchleinAngela M, [place]  [facility]             busPIRone (BUSPAR) 10 MG tablet 90 tablet 0    Sig: Take 1 tablet (10 mg total) by mouth 3 (three) times daily. To treat anxiety     Psychiatry: Anxiolytics/Hypnotics - Non-controlled Passed - 02/13/2022 12:11 PM      Passed - Valid encounter within last 12  months    Recent Outpatient Visits           7 months ago Type 2 diabetes mellitus without complication, without long-term current use of insulin (HCC)   [place] [address] [facility] Mayers, [place] S, PA-C   1 year ago Type 2 diabetes mellitus with hyperglycemia, without long-term current use of insulin [facility])   [facility] [place], [place], [name]   2 years ago Sore throat   [place] [facility] [place], [place], NP   2 years ago Essential hypertension   [place] [facility] F[place], [place], MD   3 years ago Essential hypertension   [place] [facility] [place], [place], MD       Future Appointments             In 1 month [place], Marzella Schlein, PA-C [facility]

## 2022-02-13 NOTE — Telephone Encounter (Signed)
Medication Refill - Medication: sacubitril-valsartan (ENTRESTO) 49-51 MG,carvedilol (COREG) 6.25 MG tablet,potassium chloride (KLOR-CON) 10 MEQ tablet,omeprazole (PRILOSEC) 20 MG capsule furosemide (LASIX) 20 MG tablet,busPIRone (BUSPAR) 10 MG tablet  Has the patient contacted their pharmacy? No. No more refills.   (Agent: If no, request that the patient contact the pharmacy for the refill. If patient does not wish to contact the pharmacy document the reason why and proceed with request.)   Preferred Pharmacy (with phone number or street name):  [facility]  [address], [address] [place] [address]  Phone: [phone] Fax: [phone]  Hours: M-F 7:30a-6:00p   Has the patient been seen for an appointment in the last year OR does the patient have an upcoming appointment? No.  Agent: Please be advised that RX refills may take up to 3 business days. We ask that you follow-up with your pharmacy.

## 2022-02-16 ENCOUNTER — Other Ambulatory Visit: Payer: Self-pay

## 2022-03-18 ENCOUNTER — Ambulatory Visit: Payer: Medicare Other | Admitting: Physician Assistant

## 2022-03-25 ENCOUNTER — Other Ambulatory Visit: Payer: Self-pay | Admitting: Physician Assistant

## 2022-03-25 ENCOUNTER — Other Ambulatory Visit: Payer: Self-pay | Admitting: Family Medicine

## 2022-03-25 DIAGNOSIS — K219 Gastro-esophageal reflux disease without esophagitis: Secondary | ICD-10-CM

## 2022-03-25 DIAGNOSIS — E785 Hyperlipidemia, unspecified: Secondary | ICD-10-CM

## 2022-03-25 DIAGNOSIS — I5022 Chronic systolic (congestive) heart failure: Secondary | ICD-10-CM

## 2022-03-26 ENCOUNTER — Other Ambulatory Visit (HOSPITAL_COMMUNITY): Payer: Self-pay

## 2022-03-26 MED ORDER — CARVEDILOL 6.25 MG PO TABS
ORAL_TABLET | Freq: Two times a day (BID) | ORAL | 0 refills | Status: DC
  Filled 2022-03-26: qty 60, 30d supply, fill #0

## 2022-03-26 MED ORDER — ATORVASTATIN CALCIUM 40 MG PO TABS
ORAL_TABLET | ORAL | 2 refills | Status: DC
  Filled 2022-03-26: qty 30, 30d supply, fill #0
  Filled 2022-05-07: qty 30, 30d supply, fill #1

## 2022-03-26 MED ORDER — OMEPRAZOLE 20 MG PO CPDR
DELAYED_RELEASE_CAPSULE | Freq: Every day | ORAL | 0 refills | Status: DC
  Filled 2022-03-26: qty 30, 30d supply, fill #0

## 2022-03-28 ENCOUNTER — Other Ambulatory Visit (HOSPITAL_COMMUNITY): Payer: Self-pay

## 2022-03-30 ENCOUNTER — Other Ambulatory Visit (HOSPITAL_COMMUNITY): Payer: Self-pay

## 2022-03-31 ENCOUNTER — Other Ambulatory Visit: Payer: Self-pay | Admitting: Family Medicine

## 2022-03-31 DIAGNOSIS — I5022 Chronic systolic (congestive) heart failure: Secondary | ICD-10-CM

## 2022-03-31 DIAGNOSIS — E119 Type 2 diabetes mellitus without complications: Secondary | ICD-10-CM

## 2022-04-01 ENCOUNTER — Other Ambulatory Visit (HOSPITAL_COMMUNITY): Payer: Self-pay

## 2022-04-02 ENCOUNTER — Other Ambulatory Visit (HOSPITAL_COMMUNITY): Payer: Self-pay

## 2022-04-02 MED ORDER — ONETOUCH DELICA PLUS LANCET33G MISC
0 refills | Status: DC
  Filled 2022-04-02 – 2022-09-14 (×3): qty 100, 50d supply, fill #0

## 2022-04-02 NOTE — Telephone Encounter (Signed)
Requested Prescriptions  Pending Prescriptions Disp Refills  . Lancets (ONETOUCH DELICA PLUS LANCET33G) MISC 100 each 0    Sig: Use to check blood sugar twice daily. Dx E11.9     Endocrinology: Diabetes - Testing Supplies Passed - 04/02/2022  1:39 PM      Passed - Valid encounter within last 12 months    Recent Outpatient Visits          8 months ago Type 2 diabetes mellitus without complication, without long-term current use of insulin (HCC)   [place] [address] [facility] Mayers, [name], PA-C   1 year ago Type 2 diabetes mellitus with hyperglycemia, without long-term current use of insulin [facility])   [facility] [place], [place], [name]   2 years ago Sore throat   [place] [facility] [place], [place], NP   2 years ago Essential hypertension   [place] [facility] Fulp, [place], MD   3 years ago Essential hypertension   [place] [facility] [place], [place], MD      Future Appointments            In 1 week [place], Marzella Schlein, PA-C [place] [facility]           Refused Prescriptions Disp Refills  . potassium chloride (KLOR-CON) 10 MEQ tablet 30 tablet 0    Sig: TAKE 1 TABLET (10 MEQ TOTAL) BY MOUTH ONCE DAILY.     Endocrinology:  Minerals - Potassium Supplementation Failed - 04/02/2022  1:39 PM      Failed - K in normal range and within 360 days    Potassium  Date Value Ref Range Status  05/01/2020 4.0 3.5 - 5.2 mmol/L Final         Failed - Cr in normal range and within 360 days    Creat  Date Value Ref Range Status  12/05/2015 1.18 (H) 0.50 - 1.10 mg/dL Final   Creatinine, Ser  Date Value Ref Range Status  05/01/2020 1.11 (H) 0.57 - 1.00 mg/dL Final         Passed - Valid encounter within last 12 months    Recent Outpatient Visits          8 months ago Type 2 diabetes mellitus without complication, without long-term current use of insulin (HCC)    [place] [facility] Mayers, [name], PA-C   1 year ago Type 2 diabetes mellitus with hyperglycemia, without long-term current use of insulin [facility])   [facility] [place], [place], [name]   2 years ago Sore throat   [place] [facility] [place], [place], NP   2 years ago Essential hypertension   [place] [facility] Fulp, [place], MD   3 years ago Essential hypertension   [place] [facility] [place], [place], MD      Future Appointments            In 1 week [place], Marzella Schlein, PA-C  [facility]           . furosemide (LASIX) 20 MG tablet 30 tablet 0    Sig: TAKE 1 TABLET (20 MG TOTAL) BY MOUTH ONCE DAILY. (KEEP UPCOMING APPOINTMENT FOR REFILLS).     Cardiovascular:  Diuretics - Loop Failed - 04/02/2022  1:39 PM      Failed - K in  normal range and within 180 days    Potassium  Date Value Ref Range Status  05/01/2020 4.0 3.5 - 5.2 mmol/L Final         Failed - Ca in normal range and within 180 days    Calcium  Date Value Ref Range Status  05/01/2020 9.6 8.7 - 10.2 mg/dL Final         Failed - Na in normal range and within 180 days    Sodium  Date Value Ref Range Status  05/01/2020 140 134 - 144 mmol/L Final         Failed - Cr in normal range and within 180 days    Creat  Date Value Ref Range Status  12/05/2015 1.18 (H) 0.50 - 1.10 mg/dL Final   Creatinine, Ser  Date Value Ref Range Status  05/01/2020 1.11 (H) 0.57 - 1.00 mg/dL Final         Failed - Cl in normal range and within 180 days    Chloride  Date Value Ref Range Status  05/01/2020 103 96 - 106 mmol/L Final         Failed - Mg Level in normal range and within 180 days    Magnesium  Date Value Ref Range Status  01/26/2020 2.0 1.6 - 2.3 mg/dL Final         Failed - Last BP in normal range    BP Readings from Last 1 Encounters:  07/14/21 (!) 140/99         Failed  - Valid encounter within last 6 months    Recent Outpatient Visits          8 months ago Type 2 diabetes mellitus without complication, without long-term current use of insulin (HCC)   [place] [facility] Mayers, [name], PA-C   1 year ago Type 2 diabetes mellitus with hyperglycemia, without long-term current use of insulin [facility])   [facility] [place], [place], [name]   2 years ago Sore throat   [place] [facility] [place], [place], NP   2 years ago Essential hypertension   [place] [facility] [place], [place], MD   3 years ago Essential hypertension   [place] [facility] [place], [place], MD      Future Appointments            In 1 week [place], Marzella Schlein, PA-C  [facility]

## 2022-04-02 NOTE — Telephone Encounter (Addendum)
Pt called in to follow up on refill request. Pt says that she also need   Lancets (ONETOUCH DELICA Louanne Skye) MISC sent in to    Pharmacy:  [facility] Phone:  [phone]  Fax:  [phone]       Future appt 04/09/22

## 2022-04-02 NOTE — Addendum Note (Signed)
Addended by: Elby Beck F on: 04/02/2022 01:39 PM   Modules accepted: Orders

## 2022-04-03 ENCOUNTER — Other Ambulatory Visit: Payer: Self-pay | Admitting: Family Medicine

## 2022-04-03 DIAGNOSIS — I5022 Chronic systolic (congestive) heart failure: Secondary | ICD-10-CM

## 2022-04-03 NOTE — Telephone Encounter (Signed)
Requested medication (s) are due for refill today: yes  Requested medication (s) are on the active medication list:yes   Last refill:02/13/22 and 09/19/18  Future visit scheduled: yes  Notes to clinic:  Unable to refill per protocol due to failed labs, no updated results. Pt has future OV scheduled, routing for approval.     Requested Prescriptions  Pending Prescriptions Disp Refills   potassium chloride (KLOR-CON) 10 MEQ tablet 30 tablet 0    Sig: TAKE 1 TABLET (10 MEQ TOTAL) BY MOUTH ONCE DAILY.     Endocrinology:  Minerals - Potassium Supplementation Failed - 04/03/2022 12:33 AM      Failed - K in normal range and within 360 days    Potassium  Date Value Ref Range Status  05/01/2020 4.0 3.5 - 5.2 mmol/L Final         Failed - Cr in normal range and within 360 days    Creat  Date Value Ref Range Status  12/05/2015 1.18 (H) 0.50 - 1.10 mg/dL Final   Creatinine, Ser  Date Value Ref Range Status  05/01/2020 1.11 (H) 0.57 - 1.00 mg/dL Final         Passed - Valid encounter within last 12 months    Recent Outpatient Visits           8 months ago Type 2 diabetes mellitus without complication, without long-term current use of insulin (HCC)   [place] [facility] Mayers, [name], PA-C   1 year ago Type 2 diabetes mellitus with hyperglycemia, without long-term current use of insulin [facility])   [place] [facility] [address], [place], [name]   2 years ago Sore throat   [place] [facility] [place], [place], NP   2 years ago Essential hypertension   [place] [facility] F[place], [place], MD   3 years ago Essential hypertension   [place] [facility] [place], [place], MD       Future Appointments             In 6 days [place], Marzella Schlein, PA-C  [facility]             furosemide (LASIX) 20 MG tablet 30 tablet 0    Sig: TAKE 1 TABLET (20 MG TOTAL) BY  MOUTH ONCE DAILY. (KEEP UPCOMING APPOINTMENT FOR REFILLS).     Cardiovascular:  Diuretics - Loop Failed - 04/03/2022 12:33 AM      Failed - K in normal range and within 180 days    Potassium  Date Value Ref Range Status  05/01/2020 4.0 3.5 - 5.2 mmol/L Final         Failed - Ca in normal range and within 180 days    Calcium  Date Value Ref Range Status  05/01/2020 9.6 8.7 - 10.2 mg/dL Final         Failed - Na in normal range and within 180 days    Sodium  Date Value Ref Range Status  05/01/2020 140 134 - 144 mmol/L Final         Failed - Cr in normal range and within 180 days    Creat  Date Value Ref Range Status  12/05/2015 1.18 (H) 0.50 - 1.10 mg/dL Final   Creatinine, Ser  Date Value Ref Range Status  05/01/2020 1.11 (H) 0.57 - 1.00 mg/dL Final         Failed - Cl in  normal range and within 180 days    Chloride  Date Value Ref Range Status  05/01/2020 103 96 - 106 mmol/L Final         Failed - Mg Level in normal range and within 180 days    Magnesium  Date Value Ref Range Status  01/26/2020 2.0 1.6 - 2.3 mg/dL Final         Failed - Last BP in normal range    BP Readings from Last 1 Encounters:  07/14/21 (!) 140/99         Failed - Valid encounter within last 6 months    Recent Outpatient Visits           8 months ago Type 2 diabetes mellitus without complication, without long-term current use of insulin (HCC)   [place] [facility] Mayers, [place], PA-C   1 year ago Type 2 diabetes mellitus with hyperglycemia, without long-term current use of insulin [facility])   [facility] [place], [place], [name]   2 years ago Sore throat   [place] [facility] [place], [place], NP   2 years ago Essential hypertension   [place] [facility] [place], [place], MD   3 years ago Essential hypertension   [place] [facility] [place], [place], MD       Future  Appointments             In 6 days [place], Marzella Schlein, PA-C [facility]

## 2022-04-07 ENCOUNTER — Telehealth: Payer: Medicare Other | Admitting: Physician Assistant

## 2022-04-07 ENCOUNTER — Other Ambulatory Visit (HOSPITAL_COMMUNITY): Payer: Self-pay

## 2022-04-07 ENCOUNTER — Ambulatory Visit: Payer: Self-pay | Admitting: *Deleted

## 2022-04-07 DIAGNOSIS — I5022 Chronic systolic (congestive) heart failure: Secondary | ICD-10-CM

## 2022-04-07 MED ORDER — FUROSEMIDE 20 MG PO TABS
20.0000 mg | ORAL_TABLET | Freq: Every day | ORAL | 0 refills | Status: DC
  Filled 2022-04-07: qty 14, 14d supply, fill #0

## 2022-04-07 MED ORDER — POTASSIUM CHLORIDE ER 10 MEQ PO TBCR
EXTENDED_RELEASE_TABLET | Freq: Every day | ORAL | 0 refills | Status: DC
  Filled 2022-04-07: qty 14, 14d supply, fill #0

## 2022-04-07 NOTE — Telephone Encounter (Signed)
  Chief Complaint: swelling of both legs, feet and arms, shortness of breath, intermittent chest discomfort Symptoms: above for over a month because been out of her Lasix and potassium due to not being seen for over a year.  She is refusing to be seen by Georgian Co, PA (Appt. 04/09/2022) or Dr. Alvis Lemmings.   No one else has availability until into Oct. And Nov.  Made pt aware of this however she is refusing to keep the appt. On 04/09/2022.  I gave her the option of a MyChart virtual care and she didn't want to do that either.  Also mentioned ED or urgent care.   She finally hung up on me. Frequency: for over a month because needs to be seen for refills.    Pertinent Negatives: Patient denies N/A Disposition: [] ED /[] Urgent Care (no appt availability in office) / [] Appointment(In office/virtual)/ []  [place] [facility]/ [] Home Care/ [x] Refused Recommended Disposition /[] [place] [facility]/ []  Follow-up with PCP Additional Notes: Pt hung up.   I forwarded this information to and Wellness.

## 2022-04-07 NOTE — Telephone Encounter (Signed)
Message from Sabrina Ortiz sent at 04/07/2022  8:50 AM EDT  Summary: swollen legs   Patient experiencing fluid build up in her arms, legs, feet and states she need her lasix and potassium chloride (KLOR-CON) 10 MEQ tablet prior to her 04/09/2022. Patient states she does not want to see PA. Informed patient the next available appointment is not until September.           Call History   Type Contact Phone/Fax User  04/07/2022 08:44 AM EDT Phone (Incoming) Kallio, Kern Reap (Self) [phone] Aida Puffer    Reason for Disposition  [1] MODERATE leg swelling (e.g., swelling extends up to knees) AND [2] new-onset or worsening  Answer Assessment - Initial Assessment Questions 1. ONSET: "When did the swelling start?" (e.g., minutes, hours, days)     I've not taken my Lasix for over a month.   I needed to have an appt which I have 04/09/2022.   My dr. cancelled my appt.  Since I was 10 min late. I really need my medications.   I rescheduled the appt. Can I take my cholesterol pill in morning instead of the afternoon.  [facility] PA is not my dr.   I don't want to see her.   I could not find a parking space so I was late.   She cancelled my appt.   I had to reschedule because I was 15 minutes.    I do not want to see her [facility].   She cancelled my appt.  2. LOCATION: "What part of the leg is swollen?"  "Are both legs swollen or just one leg?"     I have swelling in my arms and legs.   I was put into the hospital for the last 2 times for this.   I've been without my medications over a month.    Shortness of breath and can't walk very far.   3. SEVERITY: "How bad is the swelling?" (e.g., localized; mild, moderate, severe)   - Localized: Small area of swelling localized to one leg.   - MILD pedal edema: Swelling limited to foot and ankle, pitting edema < 1/4 inch (6 mm) deep, rest and elevation eliminate most or all swelling.   - MODERATE edema: Swelling of lower leg to knee,  pitting edema > 1/4 inch (6 mm) deep, rest and elevation only partially reduce swelling.   - SEVERE edema: Swelling extends above knee, facial or hand swelling present.      Sometimes pain in my chest.   It's not a pain it's a discomfort in my chest just like before when I was put into the hospital.   I've not slept for 2 weeks all night even with my CPAP machine.   I just don't fall asleep.   4. REDNESS: "Does the swelling look red or infected?"     Not asked 5. PAIN: "Is the swelling painful to touch?" If Yes, ask: "How painful is it?"   (Scale 1-10; mild, moderate or severe)     No 6. FEVER: "Do you have a fever?" If Yes, ask: "What is it, how was it measured, and when did it start?"      Not asked 7. CAUSE: "What do you think is causing the leg swelling?"     Not having my Lasix for over a month 8. MEDICAL HISTORY: "Do you have a history of blood clots (e.g., DVT), cancer, heart failure, kidney disease, or liver failure?"  Not asked 9. RECURRENT SYMPTOM: "Have you had leg swelling before?" If Yes, ask: "When was the last time?" "What happened that time?"     Yes  been in hospital twice for swelling  10. OTHER SYMPTOMS: "Do you have any other symptoms?" (e.g., chest pain, difficulty breathing)       Shortness of breath and chest discomfort 11. PREGNANCY: "Is there any chance you are pregnant?" "When was your last menstrual period?"       Not asked  Protocols used: Leg Swelling and Edema-A-AH

## 2022-04-07 NOTE — Patient Instructions (Signed)
Sabrina Ortiz, thank you for joining Leeanne Rio, PA-C for today's virtual visit.  While this provider is not your primary care provider (PCP), if your PCP is located in our provider database this encounter information will be shared with them immediately following your visit.  Consent: (Patient) Sabrina Ortiz provided verbal consent for this virtual visit at the beginning of the encounter.  Current Medications:  Current Outpatient Medications:    aspirin EC 81 MG EC tablet, Take 1 tablet (81 mg total) by mouth daily., Disp: , Rfl:    atorvastatin (LIPITOR) 40 MG tablet, TAKE 1 TABLET BY MOUTH DAILY AT 6 PM OR LATER/AFTER EVENING MEAL, Disp: 30 tablet, Rfl: 2   Blood Glucose Monitoring Suppl (ONETOUCH VERIO) w/Device KIT, 1 Units by Does not apply route in the morning and at bedtime., Disp: 1 kit, Rfl: 0   busPIRone (BUSPAR) 10 MG tablet, Take 1 tablet (10 mg total) by mouth 3 (three) times daily. (To treat anxiety)., Disp: 90 tablet, Rfl: 0   carvedilol (COREG) 6.25 MG tablet, TAKE 1 TABLET BY MOUTH 2 TIMES DAILY WITH A MEAL., Disp: 60 tablet, Rfl: 0   furosemide (LASIX) 20 MG tablet, Take 1 tablet (20 mg total) by mouth daily. Partial fill until you follow-up with PCP later this week., Disp: 14 tablet, Rfl: 0   glucose blood (ONETOUCH VERIO) test strip, Use to check blood sugar twice daily. Dx E11.9, Disp: 100 each, Rfl: 0   Lancets (ONETOUCH DELICA PLUS [id number]) MISC, Use to check blood sugar twice daily., Disp: 100 each, Rfl: 0   metFORMIN (GLUCOPHAGE) 500 MG tablet, TAKE 1 TABLET (500 MG TOTAL) BY MOUTH DAILY WITH BREAKFAST., Disp: 30 tablet, Rfl: 2   omeprazole (PRILOSEC) 20 MG capsule, TAKE 1 CAPSULE BY MOUTH ONCE DAILY., Disp: 30 capsule, Rfl: 0   potassium chloride (KLOR-CON) 10 MEQ tablet, TAKE 1 TABLET (10 MEQ TOTAL) BY MOUTH ONCE DAILY., Disp: 14 tablet, Rfl: 0   sacubitril-valsartan (ENTRESTO) 49-51 MG, TAKE 1 TABLET BY MOUTH 2 (TWO) TIMES DAILY., Disp: 60  tablet, Rfl: 2   Medications ordered in this encounter:  Meds ordered this encounter  Medications   furosemide (LASIX) 20 MG tablet    Sig: Take 1 tablet (20 mg total) by mouth daily. Partial fill until you follow-up with PCP later this week.    Dispense:  14 tablet    Refill:  0    Order Specific Question:   Supervising Provider    Answer:   MILLER, BRIAN [[id number]]   potassium chloride (KLOR-CON) 10 MEQ tablet    Sig: TAKE 1 TABLET (10 MEQ TOTAL) BY MOUTH ONCE DAILY.    Dispense:  14 tablet    Refill:  0    Order Specific Question:   Supervising Provider    Answer:   Sabra Heck, BRIAN [[id number]]     *If you need refills on other medications prior to your next appointment, please contact your pharmacy*  Follow-Up: Call back or seek an in-person evaluation if the symptoms worsen or if the condition fails to improve as anticipated.  Other Instructions I have sent in a short supply of your medication until your follow-up at your primary care office as you are overdue for repeat evaluation and blood work. Start checking weight daily and recording. Take to follow-up visit with your PCP at end of this week.    If you have been instructed to have an in-person evaluation today at a local Urgent Care facility, please use  the link below. It will take you to a list of all of our available [facility], including address, phone number and hours of operation. Please do not delay care.  [facility]  If you or a family member do not have a primary care provider, use the link below to schedule a visit and establish care. When you choose a [facility] primary care physician or advanced practice provider, you gain a long-term partner in health. Find a Primary Care Provider  Learn more about [facility] in-office and virtual care options: [facility]

## 2022-04-07 NOTE — Progress Notes (Signed)
Virtual Visit Consent   Sabrina Ortiz, you are scheduled for a virtual visit with a [facility] provider today. Just as with appointments in the office, your consent must be obtained to participate. Your consent will be active for this visit and any virtual visit you may have with one of our providers in the next 365 days. If you have a MyChart account, a copy of this consent can be sent to you electronically.  As this is a virtual visit, video technology does not allow for your provider to perform a traditional examination. This may limit your provider's ability to fully assess your condition. If your provider identifies any concerns that need to be evaluated in person or the need to arrange testing (such as labs, EKG, etc.), we will make arrangements to do so. Although advances in technology are sophisticated, we cannot ensure that it will always work on either your end or our end. If the connection with a video visit is poor, the visit may have to be switched to a telephone visit. With either a video or telephone visit, we are not always Ortiz to ensure that we have a secure connection.  By engaging in this virtual visit, you consent to the provision of healthcare and authorize for your insurance to be billed (if applicable) for the services provided during this visit. Depending on your insurance coverage, you may receive a charge related to this service.  I need to obtain your verbal consent now. Are you willing to proceed with your visit today? Sabrina Ortiz has provided verbal consent on 04/07/2022 for a virtual visit (video or telephone). Sabrina Ortiz, [place]  Date: 04/07/2022 11:33 AM  Virtual Visit via Video Note   I, Sabrina Ortiz, connected with  Sabrina Ortiz  ([id number], [birth year]) on 04/07/22 at 10:45 AM EDT by a video-enabled telemedicine application and verified that I am speaking with the correct person using two identifiers.  Location: Patient: Virtual Visit  Location Patient: Home Provider: Virtual Visit Location Provider: Home Office   I discussed the limitations of evaluation and management by telemedicine and the availability of in person appointments. The patient expressed understanding and agreed to proceed.    History of Present Illness: Sabrina Ortiz is a 51 y.o. who identifies as a female who was assigned female at birth, and is being seen today for request of short-term supply of her Lasix and Potassium supplement until she can see her PCP office. Has been out of medication for a month with noted leg swelling and some occasional hand swelling. Is not weighing herself. Denies any chest pain or SOB. Is taking her other heart failure medications (Entresto, Carvedilol, Atorvastatin) as directed as these were refilled recently. Notes being late to her PCP appointment a couple of weeks ago due to change in parking and as she was 10 minutes late getting to the front desk she was rescheduled for later this week. Per EMR has appt scheduled 04/09/2022  HPI: HPI  Problems:  Patient Active Problem List   Diagnosis Date Noted   BMI 28.0-28.9,adult 07/16/2021   Gastroesophageal reflux disease without esophagitis 05/02/2020   Elevation of levels of liver transaminase levels  05/02/2020   Type 2 diabetes mellitus with hyperglycemia, without long-term current use of insulin ([place]) 05/02/2020   Chronic right shoulder pain 11/23/2018   Acute on chronic left systolic heart failure ([place]) 09/14/2018   Prediabetes 04/27/2018   Acute pulmonary edema ([place]) 65/78/4696   Systolic heart failure ([place])  04/09/2018   Hypertensive emergency    Allergic reaction 03/29/2018   Angioedema 03/29/2018   Recurrent urticaria 03/29/2018   Rhinitis 03/29/2018   Vitamin D deficiency 02/22/2018   Anxiety 03/31/2017   Trichomonas infection 07/17/2016   Herpes genitalis in women 07/17/2016   Essential hypertension 06/15/2013   Dyslipidemia 06/15/2013   Neuropathy 06/15/2013    GAD (generalized anxiety disorder) 03/10/2012   Major depressive disorder, recurrent, 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[facility]) 03/08/2012    Allergies:  Allergies  Allergen Reactions   Shellfish Allergy Anaphylaxis   Medications:  Current Outpatient Medications:    aspirin EC 81 MG EC tablet, Take 1 tablet (81 mg total) by mouth daily., Disp: , Rfl:    atorvastatin (LIPITOR) 40 MG tablet, TAKE 1 TABLET BY MOUTH DAILY AT 6 PM OR LATER/AFTER EVENING MEAL, Disp: 30 tablet, Rfl: 2   Blood Glucose Monitoring Suppl (ONETOUCH VERIO) w/Device KIT, 1 Units by Does not apply route in the morning and at bedtime., Disp: 1 kit, Rfl: 0   busPIRone (BUSPAR) 10 MG tablet, Take 1 tablet (10 mg total) by mouth 3 (three) times daily. (To treat anxiety)., Disp: 90 tablet, Rfl: 0   carvedilol (COREG) 6.25 MG tablet, TAKE 1 TABLET BY MOUTH 2 TIMES DAILY WITH A MEAL., Disp: 60 tablet, Rfl: 0   furosemide (LASIX) 20 MG tablet, Take 1 tablet (20 mg total) by mouth daily. Partial fill until you follow-up with PCP later this week., Disp: 14 tablet, Rfl: 0   glucose blood (ONETOUCH VERIO) test strip, Use to check blood sugar twice daily. Dx E11.9, Disp: 100 each, Rfl: 0   Lancets (ONETOUCH DELICA PLUS [id number]) MISC, Use to check blood sugar twice daily., Disp: 100 each, Rfl: 0   metFORMIN (GLUCOPHAGE) 500 MG tablet, TAKE 1 TABLET (500 MG TOTAL) BY MOUTH DAILY WITH BREAKFAST., Disp: 30 tablet, Rfl: 2   omeprazole (PRILOSEC) 20 MG capsule, TAKE 1 CAPSULE BY MOUTH ONCE DAILY., Disp: 30 capsule, Rfl: 0   potassium chloride (KLOR-CON) 10 MEQ tablet, TAKE 1 TABLET (10 MEQ TOTAL) BY MOUTH ONCE DAILY., Disp: 14 tablet, Rfl: 0   sacubitril-valsartan (ENTRESTO) 49-51 MG, TAKE 1 TABLET BY MOUTH 2 (TWO) TIMES DAILY., Disp: 60 tablet, Rfl: 2  Observations/Objective: Patient is well-developed, well-nourished in no acute distress.  Resting comfortably at home.  Head is normocephalic, atraumatic.  No labored breathing. Speech is  clear and coherent with logical content.  Patient is alert and oriented at baseline.   Assessment and Plan: 1. Chronic systolic heart failure (HCC) - furosemide (LASIX) 20 MG tablet; Take 1 tablet (20 mg total) by mouth daily. Partial fill until you follow-up with PCP later this week.  Dispense: 14 tablet; Refill: 0 - potassium chloride (KLOR-CON) 10 MEQ tablet; TAKE 1 TABLET (10 MEQ TOTAL) BY MOUTH ONCE DAILY.  Dispense: 14 tablet; Refill: 0  Will give 2 week supply of her medications with instructions for her to closely monitor swelling and check weight daily. Take recorded weights to her follow-up. Discussed no further refills would come from our department as she needs in-person follow-up as she is quite overdue for labs.   Follow Up Instructions: I discussed the assessment and treatment plan with the patient. The patient was provided an opportunity to ask questions and all were answered. The patient agreed with the plan and demonstrated an understanding of the instructions.  A copy of instructions were sent to the patient via MyChart unless otherwise noted below.   The patient was advised  to call back or seek an in-person evaluation if the symptoms worsen or if the condition fails to improve as anticipated.  Time:  I spent 10 minutes with the patient via telehealth technology discussing the above problems/concerns.    Sabrina Rio, PA-C

## 2022-04-07 NOTE — Telephone Encounter (Signed)
Patient had a virtual appt today with Marcelline Mates, PA-C

## 2022-04-09 ENCOUNTER — Ambulatory Visit: Payer: Medicare Other | Admitting: Physician Assistant

## 2022-04-09 DIAGNOSIS — E119 Type 2 diabetes mellitus without complications: Secondary | ICD-10-CM

## 2022-05-07 ENCOUNTER — Other Ambulatory Visit (HOSPITAL_COMMUNITY): Payer: Self-pay

## 2022-05-07 ENCOUNTER — Other Ambulatory Visit: Payer: Self-pay | Admitting: Family Medicine

## 2022-05-07 DIAGNOSIS — I5022 Chronic systolic (congestive) heart failure: Secondary | ICD-10-CM

## 2022-05-07 DIAGNOSIS — K219 Gastro-esophageal reflux disease without esophagitis: Secondary | ICD-10-CM

## 2022-05-14 ENCOUNTER — Other Ambulatory Visit (HOSPITAL_COMMUNITY): Payer: Self-pay

## 2022-05-25 ENCOUNTER — Other Ambulatory Visit (HOSPITAL_COMMUNITY): Payer: Self-pay

## 2022-06-02 ENCOUNTER — Other Ambulatory Visit (HOSPITAL_COMMUNITY): Payer: Self-pay

## 2022-06-13 ENCOUNTER — Other Ambulatory Visit (HOSPITAL_COMMUNITY): Payer: Self-pay

## 2022-07-27 ENCOUNTER — Other Ambulatory Visit (HOSPITAL_COMMUNITY): Payer: Self-pay

## 2022-07-28 ENCOUNTER — Ambulatory Visit: Payer: Medicare Other | Admitting: Physician Assistant

## 2022-07-28 ENCOUNTER — Other Ambulatory Visit: Payer: Self-pay

## 2022-07-28 VITALS — BP 156/90 | HR 76 | Resp 18

## 2022-07-28 DIAGNOSIS — I10 Essential (primary) hypertension: Secondary | ICD-10-CM | POA: Diagnosis not present

## 2022-07-28 DIAGNOSIS — F411 Generalized anxiety disorder: Secondary | ICD-10-CM | POA: Diagnosis not present

## 2022-07-28 DIAGNOSIS — E782 Mixed hyperlipidemia: Secondary | ICD-10-CM | POA: Insufficient documentation

## 2022-07-28 DIAGNOSIS — E785 Hyperlipidemia, unspecified: Secondary | ICD-10-CM

## 2022-07-28 DIAGNOSIS — E119 Type 2 diabetes mellitus without complications: Secondary | ICD-10-CM

## 2022-07-28 DIAGNOSIS — K219 Gastro-esophageal reflux disease without esophagitis: Secondary | ICD-10-CM | POA: Diagnosis not present

## 2022-07-28 DIAGNOSIS — I5022 Chronic systolic (congestive) heart failure: Secondary | ICD-10-CM

## 2022-07-28 DIAGNOSIS — F333 Major depressive disorder, recurrent, severe with psychotic symptoms: Secondary | ICD-10-CM

## 2022-07-28 DIAGNOSIS — G629 Polyneuropathy, unspecified: Secondary | ICD-10-CM

## 2022-07-28 DIAGNOSIS — E1165 Type 2 diabetes mellitus with hyperglycemia: Secondary | ICD-10-CM | POA: Diagnosis not present

## 2022-07-28 DIAGNOSIS — E559 Vitamin D deficiency, unspecified: Secondary | ICD-10-CM | POA: Diagnosis not present

## 2022-07-28 MED ORDER — BUSPIRONE HCL 10 MG PO TABS
10.0000 mg | ORAL_TABLET | Freq: Three times a day (TID) | ORAL | 0 refills | Status: DC
  Filled 2022-07-28 (×3): qty 90, 30d supply, fill #0

## 2022-07-28 MED ORDER — OMEPRAZOLE 20 MG PO CPDR
DELAYED_RELEASE_CAPSULE | Freq: Every day | ORAL | 0 refills | Status: DC
  Filled 2022-07-28: qty 30, 30d supply, fill #0

## 2022-07-28 MED ORDER — POTASSIUM CHLORIDE ER 10 MEQ PO TBCR
EXTENDED_RELEASE_TABLET | Freq: Every day | ORAL | 0 refills | Status: DC
  Filled 2022-07-28: qty 30, 30d supply, fill #0

## 2022-07-28 MED ORDER — FUROSEMIDE 20 MG PO TABS
20.0000 mg | ORAL_TABLET | Freq: Every day | ORAL | 0 refills | Status: DC
  Filled 2022-07-28: qty 30, 30d supply, fill #0

## 2022-07-28 MED ORDER — ATORVASTATIN CALCIUM 40 MG PO TABS
ORAL_TABLET | ORAL | 0 refills | Status: DC
  Filled 2022-07-28: qty 30, 30d supply, fill #0

## 2022-07-28 MED ORDER — SACUBITRIL-VALSARTAN 49-51 MG PO TABS
1.0000 | ORAL_TABLET | Freq: Two times a day (BID) | ORAL | 2 refills | Status: DC
  Filled 2022-07-28: qty 60, 30d supply, fill #0
  Filled 2022-11-21: qty 60, 30d supply, fill #1
  Filled 2022-12-17: qty 60, 30d supply, fill #2

## 2022-07-28 MED ORDER — CARVEDILOL 6.25 MG PO TABS
ORAL_TABLET | Freq: Two times a day (BID) | ORAL | 0 refills | Status: DC
  Filled 2022-07-28: qty 60, 30d supply, fill #0

## 2022-07-28 MED ORDER — METFORMIN HCL 500 MG PO TABS
ORAL_TABLET | Freq: Every day | ORAL | 0 refills | Status: DC
  Filled 2022-07-28: qty 30, 30d supply, fill #0

## 2022-07-28 NOTE — Progress Notes (Unsigned)
   Established Patient Office Visit  Subjective   Patient ID: Shaylen Nephew, female    DOB: [birth year]  Age: 51 y.o. MRN: [id number]  No chief complaint on file.   Has been out of meds except entresto and aspirin for the past two mnths, carvedilol for past two weeks   Been working on diabetic diet     {History (Optional):23778}  ROS    Objective:     There were no vitals taken for this visit. {Vitals History (Optional):23777}  Physical Exam   No results found for any visits on 07/28/22.  {Labs (Optional):23779}  The ASCVD Risk score (Arnett DK, et al., 2019) failed to calculate for the following reasons:   The systolic blood pressure is missing    Assessment & Plan:   Problem List Items Addressed This Visit   None   No follow-ups on file.    Kasandra Knudsen Mayers, PA-C

## 2022-07-28 NOTE — Patient Instructions (Signed)
I have sent a refill of your medications to your pharmacy.  Your A1C is 5.8, Keep up the GREAT WORK!!  You will have your fasting labs completed tomorrow, we will call you with your lab results when they are available.  I encourage you to check your blood pressure at home, keep a written log and have available for all office visits.  Please let us know if there is anything else we can do for you  Roney Jaffe, PA-C Physician Assistant [facility] [email or web address]   How to Take Your Blood Pressure Blood pressure is a measurement of how strongly your blood is pressing against the walls of your arteries. Arteries are blood vessels that carry blood from your heart throughout your body. Your health care provider takes your blood pressure at each office visit. You can also take your own blood pressure at home with a blood pressure monitor. You may need to take your own blood pressure to: Confirm a diagnosis of high blood pressure (hypertension). Monitor your blood pressure over time. Make sure your blood pressure medicine is working. Supplies needed: Blood pressure monitor. A chair to sit in. This should be a chair where you can sit upright with your back supported. Do not sit on a soft couch or an armchair. Table or desk. Small notebook and pencil or pen. How to prepare To get the most accurate reading, avoid the following for 30 minutes before you check your blood pressure: Drinking caffeine. Drinking alcohol. Eating. Smoking. Exercising. Five minutes before you check your blood pressure: Use the bathroom and urinate so that you have an empty bladder. Sit quietly in a chair. Do not talk. How to take your blood pressure To check your blood pressure, follow the instructions in the manual that came with your blood pressure monitor. If you have a digital blood pressure monitor, the instructions may be as follows: Sit up  straight in a chair. Place your feet on the floor. Do not cross your ankles or legs. Rest your left arm at the level of your heart on a table or desk or on the arm of a chair. Pull up your shirt sleeve. Wrap the blood pressure cuff around the upper part of your left arm, 1 inch (2.5 cm) above your elbow. It is best to wrap the cuff around bare skin. Fit the cuff snugly, but not too tightly, around your arm. You should be able to place only one finger between the cuff and your arm. Position the cord so that it rests in the bend of your elbow. Press the power button. Sit quietly while the cuff inflates and deflates. Read the digital reading on the monitor screen and write the numbers down (record them) in a notebook. Wait 2-3 minutes, then repeat the steps, starting at step 1. What does my blood pressure reading mean? A blood pressure reading consists of a higher number over a lower number. Ideally, your blood pressure should be below 120/80. The first ("top") number is called the systolic pressure. It is a measure of the pressure in your arteries as your heart beats. The second ("bottom") number is called the diastolic pressure. It is a measure of the pressure in your arteries as the heart relaxes. Blood pressure is classified into four stages. The following are the stages for adults who do not have a short-term serious illness or a chronic condition. Systolic pressure and diastolic pressure are measured in a unit called mm Hg (millimeters of mercury).  Normal Systolic pressure: below 120. Diastolic pressure: below 80. Elevated Systolic pressure: 120-129. Diastolic pressure: below 80. Hypertension stage 1 Systolic pressure: 130-139. Diastolic pressure: 80-89. Hypertension stage 2 Systolic pressure: 140 or above. Diastolic pressure: 90 or above. You can have elevated blood pressure or hypertension even if only the systolic or only the diastolic number in your reading is higher than  normal. Follow these instructions at home: Medicines Take over-the-counter and prescription medicines only as told by your health care provider. Tell your health care provider if you are having any side effects from blood pressure medicine. General instructions Check your blood pressure as often as recommended by your health care provider. Check your blood pressure at the same time every day. Take your monitor to the next appointment with your health care provider to make sure that: You are using it correctly. It provides accurate readings. Understand what your goal blood pressure numbers are. Keep all follow-up visits. This is important. General tips Your health care provider can suggest a reliable monitor that will meet your needs. There are several types of home blood pressure monitors. Choose a monitor that has an arm cuff. Do not choose a monitor that measures your blood pressure from your wrist or finger. Choose a cuff that wraps snugly, not too tight or too loose, around your upper arm. You should be able to fit only one finger between your arm and the cuff. You can buy a blood pressure monitor at most drugstores or online. Where to find more information American Heart Association: www.heart.org Contact a health care provider if: Your blood pressure is consistently high. Your blood pressure is suddenly low. Get help right away if: Your systolic blood pressure is higher than 180. Your diastolic blood pressure is higher than 120. These symptoms may be an emergency. Get help right away. Call 911. Do not wait to see if the symptoms will go away. Do not drive yourself to the hospital. Summary Blood pressure is a measurement of how strongly your blood is pressing against the walls of your arteries. A blood pressure reading consists of a higher number over a lower number. Ideally, your blood pressure should be below 120/80. Check your blood pressure at the same time every day. Avoid  caffeine, alcohol, smoking, and exercise for 30 minutes prior to checking your blood pressure. These agents can affect the accuracy of the blood pressure reading. This information is not intended to replace advice given to you by your health care provider. Make sure you discuss any questions you have with your health care provider. Document Revised: 05/08/2021 Document Reviewed: 05/08/2021 Elsevier Patient Education  2023 [employer].

## 2022-07-29 ENCOUNTER — Other Ambulatory Visit: Payer: Medicare Other

## 2022-07-29 ENCOUNTER — Encounter: Payer: Self-pay | Admitting: Physician Assistant

## 2022-07-29 LAB — POCT GLYCOSYLATED HEMOGLOBIN (HGB A1C)
HbA1c POC (<> result, manual entry): 5.8 % (ref 4.0–5.6)
HbA1c, POC (controlled diabetic range): 5.8 % (ref 0.0–7.0)
HbA1c, POC (prediabetic range): 5.8 % (ref 5.7–6.4)
Hemoglobin A1C: 5.8 % — AB (ref 4.0–5.6)

## 2022-08-06 ENCOUNTER — Other Ambulatory Visit (HOSPITAL_COMMUNITY): Payer: Self-pay

## 2022-08-06 ENCOUNTER — Encounter: Payer: Self-pay | Admitting: Physician Assistant

## 2022-08-06 ENCOUNTER — Ambulatory Visit: Payer: Medicare Other | Attending: Physician Assistant | Admitting: Physician Assistant

## 2022-08-06 DIAGNOSIS — I5022 Chronic systolic (congestive) heart failure: Secondary | ICD-10-CM | POA: Diagnosis not present

## 2022-08-06 DIAGNOSIS — I110 Hypertensive heart disease with heart failure: Secondary | ICD-10-CM | POA: Insufficient documentation

## 2022-08-06 DIAGNOSIS — E1165 Type 2 diabetes mellitus with hyperglycemia: Secondary | ICD-10-CM | POA: Insufficient documentation

## 2022-08-06 DIAGNOSIS — E785 Hyperlipidemia, unspecified: Secondary | ICD-10-CM | POA: Insufficient documentation

## 2022-08-06 DIAGNOSIS — I10 Essential (primary) hypertension: Secondary | ICD-10-CM

## 2022-08-06 DIAGNOSIS — E559 Vitamin D deficiency, unspecified: Secondary | ICD-10-CM | POA: Diagnosis not present

## 2022-08-06 DIAGNOSIS — F411 Generalized anxiety disorder: Secondary | ICD-10-CM | POA: Diagnosis not present

## 2022-08-06 MED ORDER — BUSPIRONE HCL 10 MG PO TABS
10.0000 mg | ORAL_TABLET | Freq: Three times a day (TID) | ORAL | 0 refills | Status: DC
  Filled 2022-08-06 – 2022-12-03 (×2): qty 90, 30d supply, fill #0

## 2022-08-06 MED ORDER — METFORMIN HCL 500 MG PO TABS
500.0000 mg | ORAL_TABLET | Freq: Every day | ORAL | 1 refills | Status: DC
  Filled 2022-08-06: qty 90, fill #0
  Filled 2022-12-03: qty 90, 90d supply, fill #0
  Filled 2023-02-15: qty 90, 90d supply, fill #1

## 2022-08-06 MED ORDER — POTASSIUM CHLORIDE ER 10 MEQ PO TBCR
10.0000 meq | EXTENDED_RELEASE_TABLET | Freq: Every day | ORAL | 3 refills | Status: DC
  Filled 2022-08-06: qty 30, fill #0
  Filled 2022-09-14: qty 30, 30d supply, fill #0
  Filled 2022-10-26: qty 30, 30d supply, fill #1
  Filled 2022-11-30: qty 30, 30d supply, fill #2
  Filled 2023-01-08: qty 30, 30d supply, fill #3

## 2022-08-06 MED ORDER — FUROSEMIDE 20 MG PO TABS
20.0000 mg | ORAL_TABLET | Freq: Every day | ORAL | 0 refills | Status: DC
  Filled 2022-08-06 – 2022-09-14 (×2): qty 30, 30d supply, fill #0

## 2022-08-06 MED ORDER — CARVEDILOL 6.25 MG PO TABS
6.2500 mg | ORAL_TABLET | Freq: Two times a day (BID) | ORAL | 3 refills | Status: DC
  Filled 2022-08-06: qty 60, fill #0
  Filled 2022-09-14: qty 60, 30d supply, fill #0
  Filled 2022-12-17: qty 60, 30d supply, fill #1
  Filled 2023-02-15: qty 60, 30d supply, fill #2
  Filled 2023-04-02: qty 60, 30d supply, fill #3

## 2022-08-06 MED ORDER — ATORVASTATIN CALCIUM 40 MG PO TABS
40.0000 mg | ORAL_TABLET | Freq: Every day | ORAL | 0 refills | Status: DC
  Filled 2022-08-06: qty 30, fill #0
  Filled 2022-09-14: qty 30, 30d supply, fill #0

## 2022-08-06 NOTE — Progress Notes (Signed)
Patient ID: Sabrina Ortiz, female   DOB: 03-05-1971, 51 y.o.   MRN: [id number]   Sabrina Ortiz, is a 51 y.o. female  [id number]  [id number]  DOB - 1971/03/28  No chief complaint on file.      Subjective:   Sabrina Ortiz is a 51 y.o. female here today for labs and subsequent RF.  Seen by Cari on 07/28/2022.  She wants to get a handicapped placard bc of deconditioning and chronic back pain.  She said she had one but it's expired.    No problems updated.  ALLERGIES: Allergies  Allergen Reactions   Shellfish Allergy Anaphylaxis    PAST MEDICAL HISTORY: Past Medical History:  Diagnosis Date   Angio-edema    Anxiety    Chronic back pain    COPD (chronic obstructive pulmonary disease) (HCC)    Depression    Hypertension    Mental disorder    PTSD (post-traumatic stress disorder)    Urticaria     MEDICATIONS AT HOME: Prior to Admission medications   Medication Sig Start Date End Date Taking? Authorizing Provider  aspirin EC 81 MG EC tablet Take 1 tablet (81 mg total) by mouth daily. 09/19/18  Yes Dixie Dials, MD  Blood Glucose Monitoring Suppl (ONETOUCH VERIO) w/Device KIT 1 Units by Does not apply route in the morning and at bedtime. 05/01/20  Yes Mayers, [name], PA-C  glucose blood (ONETOUCH VERIO) test strip Use to check blood sugar twice daily. Dx E11.9 12/02/21  Yes Charlott Rakes, MD  Lancets (ONETOUCH DELICA PLUS [id number]) MISC Use to check blood sugar twice daily. 04/02/22  Yes Tanay Misuraca M, PA-C  omeprazole (PRILOSEC) 20 MG capsule TAKE 1 CAPSULE BY MOUTH ONCE DAILY. 07/28/22  Yes Mayers, [name], PA-C  sacubitril-valsartan (ENTRESTO) 49-51 MG TAKE 1 TABLET BY MOUTH 2 (TWO) TIMES DAILY. 07/28/22  Yes Mayers, [name], PA-C  atorvastatin (LIPITOR) 40 MG tablet TAKE 1 TABLET BY MOUTH DAILY AT 6 PM OR LATER/AFTER EVENING MEAL 08/06/22 08/06/23  Argentina Donovan, PA-C  busPIRone (BUSPAR) 10 MG tablet Take 1 tablet (10 mg total) by mouth 3 (three) times daily.  (To treat anxiety). 08/06/22   Argentina Donovan, PA-C  carvedilol (COREG) 6.25 MG tablet TAKE 1 TABLET BY MOUTH 2 TIMES DAILY WITH A MEAL. 08/06/22   Argentina Donovan, PA-C  furosemide (LASIX) 20 MG tablet Take 1 tablet (20 mg total) by mouth daily. 08/06/22   Argentina Donovan, PA-C  metFORMIN (GLUCOPHAGE) 500 MG tablet TAKE 1 TABLET (500 MG TOTAL) BY MOUTH DAILY WITH BREAKFAST. 08/06/22 08/06/23  Argentina Donovan, PA-C  potassium chloride (KLOR-CON) 10 MEQ tablet TAKE 1 TABLET (10 MEQ TOTAL) BY MOUTH ONCE DAILY. 08/06/22   Argentina Donovan, PA-C  potassium chloride (K-DUR,KLOR-CON) 10 MEQ tablet Take 1 tablet (10 mEq total) by mouth daily. 09/19/18 02/22/19  Fulp, Cammie, MD    ROS: Neg HEENT Neg resp Neg cardiac Neg GI Neg GU Neg psych Neg neuro  Objective:   Vitals:   08/06/22 1541  BP: (!) 153/98  Pulse: 89  SpO2: 99%  Weight: 176 lb 9.6 oz (80.1 kg)   Exam General appearance : Awake, alert, not in any distress. Speech Clear. Not toxic looking HEENT: Atraumatic and Normocephalic Neck: Supple, no JVD. No cervical lymphadenopathy.  Chest: Good air entry bilaterally, CTAB.  No rales/rhonchi/wheezing CVS: S1 S2 regular, no murmurs.  Extremities: B/L Lower Ext shows no edema, both legs are warm to touch Neurology: Awake alert,  and oriented X 3, CN II-XII intact, Non focal Skin: No Rash  Data Review Lab Results  Component Value Date   HGBA1C 5.8 (A) 07/28/2022   HGBA1C 5.8 07/28/2022   HGBA1C 5.8 07/28/2022   HGBA1C 5.8 07/28/2022    Assessment & Plan   1. Chronic systolic heart failure (HCC) - furosemide (LASIX) 20 MG tablet; Take 1 tablet (20 mg total) by mouth daily.  Dispense: 30 tablet; Refill: 0 - carvedilol (COREG) 6.25 MG tablet; TAKE 1 TABLET BY MOUTH 2 TIMES DAILY WITH A MEAL.  Dispense: 60 tablet; Refill: 3 - potassium chloride (KLOR-CON) 10 MEQ tablet; TAKE 1 TABLET (10 MEQ TOTAL) BY MOUTH ONCE DAILY.  Dispense: 30 tablet; Refill: 3  2. GAD  (generalized anxiety disorder) - busPIRone (BUSPAR) 10 MG tablet; Take 1 tablet (10 mg total) by mouth 3 (three) times daily. (To treat anxiety).  Dispense: 90 tablet; Refill: 0  3. Dyslipidemia - atorvastatin (LIPITOR) 40 MG tablet; TAKE 1 TABLET BY MOUTH DAILY AT 6 PM OR LATER/AFTER EVENING MEAL  Dispense: 30 tablet; Refill: 0  4. Type 2 diabetes mellitus with hyperglycemia, without long-term current use of insulin (HCC) Continue current regimen - metFORMIN (GLUCOPHAGE) 500 MG tablet; TAKE 1 TABLET (500 MG TOTAL) BY MOUTH DAILY WITH BREAKFAST.  Dispense: 90 tablet; Refill: 1     Return in about 4 months (around 12/05/2022) for PCP for chronic conditions.  The patient was given clear instructions to go to ER or return to medical center if symptoms don't improve, worsen or new problems develop. The patient verbalized understanding. The patient was told to call to get lab results if they haven't heard anything in the next week.      Freeman Caldron, PA-C [facility] and [place], [place]   08/06/2022, 3:53 PM

## 2022-08-07 ENCOUNTER — Other Ambulatory Visit: Payer: Self-pay | Admitting: Physician Assistant

## 2022-08-07 ENCOUNTER — Other Ambulatory Visit: Payer: Self-pay

## 2022-08-07 DIAGNOSIS — E559 Vitamin D deficiency, unspecified: Secondary | ICD-10-CM

## 2022-08-07 LAB — CBC WITH DIFFERENTIAL/PLATELET
Basophils Absolute: 0 10*3/uL (ref 0.0–0.2)
Basos: 0 %
EOS (ABSOLUTE): 0.1 10*3/uL (ref 0.0–0.4)
Eos: 1 %
Hematocrit: 42.8 % (ref 34.0–46.6)
Hemoglobin: 14.2 g/dL (ref 11.1–15.9)
Immature Grans (Abs): 0 10*3/uL (ref 0.0–0.1)
Immature Granulocytes: 0 %
Lymphocytes Absolute: 1.6 10*3/uL (ref 0.7–3.1)
Lymphs: 23 %
MCH: 28 pg (ref 26.6–33.0)
MCHC: 33.2 g/dL (ref 31.5–35.7)
MCV: 84 fL (ref 79–97)
Monocytes Absolute: 0.5 10*3/uL (ref 0.1–0.9)
Monocytes: 8 %
Neutrophils Absolute: 4.7 10*3/uL (ref 1.4–7.0)
Neutrophils: 68 %
Platelets: 188 10*3/uL (ref 150–450)
RBC: 5.07 x10E6/uL (ref 3.77–5.28)
RDW: 14.3 % (ref 11.7–15.4)
WBC: 6.9 10*3/uL (ref 3.4–10.8)

## 2022-08-07 LAB — COMP. METABOLIC PANEL (12)
AST: 73 IU/L — ABNORMAL HIGH (ref 0–40)
Albumin/Globulin Ratio: 1.6 (ref 1.2–2.2)
Albumin: 4.5 g/dL (ref 3.8–4.9)
Alkaline Phosphatase: 61 IU/L (ref 44–121)
BUN/Creatinine Ratio: 12 (ref 9–23)
BUN: 13 mg/dL (ref 6–24)
Bilirubin Total: 0.3 mg/dL (ref 0.0–1.2)
Calcium: 9.4 mg/dL (ref 8.7–10.2)
Chloride: 102 mmol/L (ref 96–106)
Creatinine, Ser: 1.06 mg/dL — ABNORMAL HIGH (ref 0.57–1.00)
Globulin, Total: 2.8 g/dL (ref 1.5–4.5)
Glucose: 115 mg/dL — ABNORMAL HIGH (ref 70–99)
Potassium: 3.9 mmol/L (ref 3.5–5.2)
Sodium: 140 mmol/L (ref 134–144)
Total Protein: 7.3 g/dL (ref 6.0–8.5)
eGFR: 64 mL/min/{1.73_m2} (ref >59)

## 2022-08-07 LAB — LIPID PANEL
Chol/HDL Ratio: 3.6 ratio (ref 0.0–4.4)
Cholesterol, Total: 294 mg/dL — ABNORMAL HIGH (ref 100–199)
HDL: 81 mg/dL (ref >39)
LDL Chol Calc (NIH): 183 mg/dL — ABNORMAL HIGH (ref 0–99)
Triglycerides: 168 mg/dL — ABNORMAL HIGH (ref 0–149)
VLDL Cholesterol Cal: 30 mg/dL (ref 5–40)

## 2022-08-07 LAB — TSH: TSH: 1.23 u[IU]/mL (ref 0.450–4.500)

## 2022-08-07 LAB — VITAMIN D 25 HYDROXY (VIT D DEFICIENCY, FRACTURES): Vit D, 25-Hydroxy: 13.5 ng/mL — ABNORMAL LOW (ref 30.0–100.0)

## 2022-08-07 MED ORDER — VITAMIN D (ERGOCALCIFEROL) 1.25 MG (50000 UNIT) PO CAPS
50000.0000 [IU] | ORAL_CAPSULE | ORAL | 0 refills | Status: DC
  Filled 2022-08-07: qty 12, 84d supply, fill #0

## 2022-08-08 LAB — MICROALBUMIN / CREATININE URINE RATIO
Creatinine, Urine: 400.9 mg/dL
Microalb/Creat Ratio: 23 mg/g creat (ref 0–29)
Microalbumin, Urine: 90.5 ug/mL

## 2022-08-14 ENCOUNTER — Other Ambulatory Visit: Payer: Self-pay

## 2022-08-19 ENCOUNTER — Encounter: Payer: Self-pay | Admitting: *Deleted

## 2022-09-14 ENCOUNTER — Other Ambulatory Visit (HOSPITAL_COMMUNITY): Payer: Self-pay

## 2022-09-14 ENCOUNTER — Other Ambulatory Visit: Payer: Self-pay | Admitting: Physician Assistant

## 2022-09-14 ENCOUNTER — Other Ambulatory Visit: Payer: Self-pay | Admitting: Family Medicine

## 2022-09-14 DIAGNOSIS — K219 Gastro-esophageal reflux disease without esophagitis: Secondary | ICD-10-CM

## 2022-09-14 DIAGNOSIS — E119 Type 2 diabetes mellitus without complications: Secondary | ICD-10-CM

## 2022-09-15 ENCOUNTER — Other Ambulatory Visit: Payer: Self-pay

## 2022-09-15 ENCOUNTER — Other Ambulatory Visit (HOSPITAL_COMMUNITY): Payer: Self-pay

## 2022-09-15 MED ORDER — OMEPRAZOLE 20 MG PO CPDR
20.0000 mg | DELAYED_RELEASE_CAPSULE | Freq: Every day | ORAL | 2 refills | Status: DC
  Filled 2022-09-15: qty 30, 30d supply, fill #0
  Filled 2022-10-26: qty 30, 30d supply, fill #1
  Filled 2022-11-30: qty 30, 30d supply, fill #2

## 2022-09-15 MED ORDER — ONETOUCH VERIO VI STRP
ORAL_STRIP | 0 refills | Status: DC
  Filled 2022-09-15: qty 100, 50d supply, fill #0

## 2022-09-15 NOTE — Telephone Encounter (Signed)
Requested medication (s) are due for refill today - yes  Requested medication (s) are on the active medication list -yes  Future visit scheduled -yes  Last refill: 07/28/22 #30  Notes to clinic: outside provider Rx- sent for review of RF request  Requested Prescriptions  Pending Prescriptions Disp Refills   omeprazole (PRILOSEC) 20 MG capsule 30 capsule 0    Sig: TAKE 1 CAPSULE BY MOUTH ONCE DAILY.     There is no refill protocol information for this order       Requested Prescriptions  Pending Prescriptions Disp Refills   omeprazole (PRILOSEC) 20 MG capsule 30 capsule 0    Sig: TAKE 1 CAPSULE BY MOUTH ONCE DAILY.     There is no refill protocol information for this order

## 2022-09-15 NOTE — Telephone Encounter (Signed)
Requested Prescriptions  Pending Prescriptions Disp Refills   glucose blood (ONETOUCH VERIO) test strip 100 each 0    Sig: Use to check blood sugar twice daily. Dx E11.9     Endocrinology: Diabetes - Testing Supplies Passed - 09/14/2022  5:18 PM      Passed - Valid encounter within last 12 months    Recent Outpatient Visits           1 month ago Chronic systolic heart failure [facility])   [place], [place], [place]   1 year ago Type 2 diabetes mellitus without complication, without long-term current use of insulin [facility])   [place], PA-C   1 year ago Type 2 diabetes mellitus with hyperglycemia, without long-term current use of insulin [facility])   [place], [place], [place]   2 years ago Sore throat   [place], [place], NP   3 years ago Essential hypertension   [place], MD       Future Appointments             In 2 months Charlott Rakes, MD [place]

## 2022-09-16 ENCOUNTER — Other Ambulatory Visit: Payer: Self-pay

## 2022-10-26 ENCOUNTER — Other Ambulatory Visit (HOSPITAL_COMMUNITY): Payer: Self-pay

## 2022-10-26 ENCOUNTER — Other Ambulatory Visit: Payer: Self-pay

## 2022-10-26 ENCOUNTER — Other Ambulatory Visit: Payer: Self-pay | Admitting: Physician Assistant

## 2022-10-26 DIAGNOSIS — I5022 Chronic systolic (congestive) heart failure: Secondary | ICD-10-CM

## 2022-10-26 MED ORDER — FUROSEMIDE 20 MG PO TABS
20.0000 mg | ORAL_TABLET | Freq: Every day | ORAL | 0 refills | Status: DC
  Filled 2022-10-26: qty 30, 30d supply, fill #0

## 2022-10-27 ENCOUNTER — Other Ambulatory Visit: Payer: Self-pay

## 2022-11-21 ENCOUNTER — Other Ambulatory Visit (HOSPITAL_COMMUNITY): Payer: Self-pay

## 2022-11-21 ENCOUNTER — Other Ambulatory Visit: Payer: Self-pay | Admitting: Family Medicine

## 2022-11-21 DIAGNOSIS — I5022 Chronic systolic (congestive) heart failure: Secondary | ICD-10-CM

## 2022-11-21 MED ORDER — FUROSEMIDE 20 MG PO TABS
20.0000 mg | ORAL_TABLET | Freq: Every day | ORAL | 0 refills | Status: DC
  Filled 2022-11-21: qty 30, 30d supply, fill #0

## 2022-11-25 ENCOUNTER — Telehealth: Payer: Self-pay | Admitting: Family Medicine

## 2022-11-25 NOTE — Telephone Encounter (Signed)
Called patient to schedule Medicare Annual Wellness Visit (AWV). Left message for patient to call back and schedule Medicare Annual Wellness Visit (AWV).  Last date of AWV:  due awvi 11/05/17 per [facility]    If any questions, please contact me at [phone].  Thank you ,  Barkley Boards AWV direct phone # [phone]

## 2022-11-30 ENCOUNTER — Other Ambulatory Visit: Payer: Self-pay

## 2022-11-30 ENCOUNTER — Other Ambulatory Visit: Payer: Self-pay | Admitting: Physician Assistant

## 2022-11-30 DIAGNOSIS — E785 Hyperlipidemia, unspecified: Secondary | ICD-10-CM

## 2022-12-01 ENCOUNTER — Other Ambulatory Visit: Payer: Self-pay

## 2022-12-01 MED ORDER — ATORVASTATIN CALCIUM 40 MG PO TABS
40.0000 mg | ORAL_TABLET | Freq: Every day | ORAL | 0 refills | Status: DC
  Filled 2022-12-01: qty 30, 30d supply, fill #0

## 2022-12-02 ENCOUNTER — Telehealth: Payer: Self-pay | Admitting: Family Medicine

## 2022-12-02 ENCOUNTER — Ambulatory Visit: Payer: Medicare HMO | Attending: Family Medicine | Admitting: Family Medicine

## 2022-12-02 ENCOUNTER — Encounter: Payer: Self-pay | Admitting: Family Medicine

## 2022-12-02 ENCOUNTER — Ambulatory Visit: Payer: Medicare Other | Admitting: Family Medicine

## 2022-12-02 ENCOUNTER — Other Ambulatory Visit (HOSPITAL_COMMUNITY): Payer: Self-pay

## 2022-12-02 DIAGNOSIS — E785 Hyperlipidemia, unspecified: Secondary | ICD-10-CM

## 2022-12-02 DIAGNOSIS — M549 Dorsalgia, unspecified: Secondary | ICD-10-CM

## 2022-12-02 DIAGNOSIS — F411 Generalized anxiety disorder: Secondary | ICD-10-CM

## 2022-12-02 DIAGNOSIS — M7501 Adhesive capsulitis of right shoulder: Secondary | ICD-10-CM | POA: Diagnosis not present

## 2022-12-02 DIAGNOSIS — I152 Hypertension secondary to endocrine disorders: Secondary | ICD-10-CM | POA: Diagnosis not present

## 2022-12-02 DIAGNOSIS — I5023 Acute on chronic systolic (congestive) heart failure: Secondary | ICD-10-CM

## 2022-12-02 DIAGNOSIS — E1159 Type 2 diabetes mellitus with other circulatory complications: Secondary | ICD-10-CM

## 2022-12-02 DIAGNOSIS — E1169 Type 2 diabetes mellitus with other specified complication: Secondary | ICD-10-CM

## 2022-12-02 DIAGNOSIS — R69 Illness, unspecified: Secondary | ICD-10-CM | POA: Diagnosis not present

## 2022-12-02 DIAGNOSIS — I5022 Chronic systolic (congestive) heart failure: Secondary | ICD-10-CM

## 2022-12-02 MED ORDER — DAPAGLIFLOZIN PROPANEDIOL 10 MG PO TABS
10.0000 mg | ORAL_TABLET | Freq: Every day | ORAL | 1 refills | Status: DC
  Filled 2022-12-02: qty 90, 90d supply, fill #0
  Filled 2023-02-25: qty 90, 90d supply, fill #1

## 2022-12-02 MED ORDER — TRAMADOL HCL 50 MG PO TABS
50.0000 mg | ORAL_TABLET | Freq: Two times a day (BID) | ORAL | 0 refills | Status: DC | PRN
  Filled 2022-12-02: qty 15, 7d supply, fill #0
  Filled 2023-02-15 – 2023-02-25 (×2): qty 15, 7d supply, fill #1

## 2022-12-02 MED ORDER — FUROSEMIDE 20 MG PO TABS
20.0000 mg | ORAL_TABLET | Freq: Every day | ORAL | 1 refills | Status: DC
  Filled 2022-12-02 – 2023-01-07 (×2): qty 90, 90d supply, fill #0
  Filled 2023-04-02: qty 90, 90d supply, fill #1

## 2022-12-02 MED ORDER — ATORVASTATIN CALCIUM 40 MG PO TABS
40.0000 mg | ORAL_TABLET | Freq: Every day | ORAL | 1 refills | Status: DC
  Filled 2022-12-02 – 2023-01-08 (×2): qty 90, 90d supply, fill #0
  Filled 2023-02-15 – 2023-04-05 (×2): qty 90, 90d supply, fill #1

## 2022-12-02 NOTE — Progress Notes (Signed)
Virtual Visit via Video Note  I connected with Sabrina Ortiz, on 12/02/2022 at 3:11 PM by video enabled telemedicine device and verified that I am speaking with the correct person using two identifiers.   Consent: I discussed the limitations, risks, security and privacy concerns of performing an evaluation and management service by telemedicine and the availability of in person appointments. I also discussed with the patient that there may be a patient responsible charge related to this service. The patient expressed understanding and agreed to proceed.   Location of Patient: Home  Location of Provider: Clinic   Persons participating in Telemedicine visit: Sabrina Ortiz [facility] Dr. Margarita Rana     History of Present Illness: Sabrina Ortiz is a 52 y.o. year old female  with a history of hypertension, hyperlipidemia, GERD, anxiety and depression, HFrEF (EF 25% from echo 09/2018), type 2 diabetes mellitus (A1c 5.8) who presents today for a follow-up visit.     She has right shoulder and lower back pain but will be seeing [facility] on 12/15/22 Back is 8/10, shoulder is a 10/10. Sometimes back pain radiates down her legs but at the moment pain does not radiate. Right shoulder pain radiates down to her biceps. She used Tramadol in the past prescribed by Ortho.  Per patient she was to have right shoulder surgery but did not receive cardiac clearance from her cardiologist.  She has received epidural spinal injections in the past for her back.  Endorses adherence with metformin for her diabetes. From a cardiac standpoint she has not been to see her cardiologist due to the fact that she was having to pay out-of-pocket.  She denies presence of dyspnea or chest pain but is adherent with her cardiac medications. For anxiety she remains on BuSpar. Past Medical History:  Diagnosis Date   Angio-edema    Anxiety    Chronic back pain    COPD (chronic obstructive pulmonary disease) (HCC)     Depression    Hypertension    Mental disorder    PTSD (post-traumatic stress disorder)    Urticaria    Allergies  Allergen Reactions   Shellfish Allergy Anaphylaxis    Current Outpatient Medications on File Prior to Visit  Medication Sig Dispense Refill   aspirin EC 81 MG EC tablet Take 1 tablet (81 mg total) by mouth daily.     atorvastatin (LIPITOR) 40 MG tablet Take 1 tablet (40 mg total) by mouth daily at 6pm or later after evening meal 30 tablet 0   Blood Glucose Monitoring Suppl (ONETOUCH VERIO) w/Device KIT 1 Units by Does not apply route in the morning and at bedtime. 1 kit 0   busPIRone (BUSPAR) 10 MG tablet Take 1 tablet (10 mg total) by mouth 3 (three) times daily. (To treat anxiety). 90 tablet 0   carvedilol (COREG) 6.25 MG tablet Take 1 tablet (6.25 mg total) by mouth 2 (two) times daily with a meal. 60 tablet 3   furosemide (LASIX) 20 MG tablet Take 1 tablet (20 mg total) by mouth daily. 30 tablet 0   glucose blood (ONETOUCH VERIO) test strip Use to check blood sugar twice daily. Dx E11.9 100 each 0   Lancets (ONETOUCH DELICA PLUS [id number]) MISC Use to check blood sugar twice daily. 100 each 0   metFORMIN (GLUCOPHAGE) 500 MG tablet Take 1 tablet (500 mg total) by mouth daily with breakfast. 90 tablet 1   omeprazole (PRILOSEC) 20 MG capsule Take 1 capsule (20 mg total) by mouth daily.  30 capsule 2   potassium chloride (KLOR-CON) 10 MEQ tablet Take 1 tablet (10 mEq total) by mouth daily. 30 tablet 3   sacubitril-valsartan (ENTRESTO) 49-51 MG Take 1 tablet by mouth 2 (two) times daily. 60 tablet 2   Vitamin D, Ergocalciferol, (DRISDOL) 1.25 MG (50000 UNIT) CAPS capsule Take 1 capsule (50,000 Units total) by mouth every 7 (seven) days. 16 capsule 0   [DISCONTINUED] potassium chloride (K-DUR,KLOR-CON) 10 MEQ tablet Take 1 tablet (10 mEq total) by mouth daily. 30 tablet 3   No current facility-administered medications on file prior to visit.    ROS: See  HPI  Observations/Objective: Awake, alert, oriented x3 Not in acute distress Extremity reveals severely restricted active abduction in right upper extremity Normal mood      Latest Ref Rng & Units 08/06/2022    4:12 PM 05/01/2020    4:52 PM 01/26/2020    2:54 PM  CMP  Glucose 70 - 99 mg/dL 115  230  221   BUN 6 - 24 mg/dL 13  8  7    Creatinine 0.57 - 1.00 mg/dL 1.06  1.11  1.05   Sodium 134 - 144 mmol/L 140  140  140   Potassium 3.5 - 5.2 mmol/L 3.9  4.0  4.0   Chloride 96 - 106 mmol/L 102  103  104   CO2 20 - 29 mmol/L   21   Calcium 8.7 - 10.2 mg/dL 9.4  9.6  9.5   Total Protein 6.0 - 8.5 g/dL 7.3  7.6  7.2   Total Bilirubin 0.0 - 1.2 mg/dL 0.3  0.6  0.5   Alkaline Phos 44 - 121 IU/L 61  68  64   AST 0 - 40 IU/L 73  64  49   ALT 0 - 32 IU/L   62     Lipid Panel     Component Value Date/Time   CHOL 294 (H) 08/06/2022 1612   TRIG 168 (H) 08/06/2022 1612   HDL 81 08/06/2022 1612   CHOLHDL 3.6 08/06/2022 1612   CHOLHDL 4.0 09/17/2018 0415   VLDL 25 09/17/2018 0415   LDLCALC 183 (H) 08/06/2022 1612   LABVLDL 30 08/06/2022 1612    Lab Results  Component Value Date   HGBA1C 5.8 (A) 07/28/2022   HGBA1C 5.8 07/28/2022   HGBA1C 5.8 07/28/2022   HGBA1C 5.8 07/28/2022     Assessment and Plan: 1. Chronic systolic heart failure (HCC) EF of 25% from echo of 09/2018 Echocardiogram ordered Discussed benefits and adverse effects of SGLT2i and after shared decision making, [place]ade will be initiated Advised her to schedule follow-up appointment with her cardiologist as she has not been seen in a while. - dapagliflozin propanediol (FARXIGA) 10 MG TABS tablet; Take 1 tablet (10 mg total) by mouth daily before breakfast.  Dispense: 90 tablet; Refill: 1 - furosemide (LASIX) 20 MG tablet; Take 1 tablet (20 mg total) by mouth daily.  Dispense: 90 tablet; Refill: 1  2. GAD (generalized anxiety disorder) Stable Currently on BuSpar  3. Hypertension associated with diabetes  ([place]) She has been unable to check her blood pressure at home due to right arm pain  4. Type 2 diabetes mellitus with other specified complication, without long-term current use of insulin (HCC) Controlled with A1c of 5.8 Will place on Farxiga Consider discontinuing metformin at next office visit - dapagliflozin propanediol (FARXIGA) 10 MG TABS tablet; Take 1 tablet (10 mg total) by mouth daily before breakfast.  Dispense: 90 tablet; Refill:  1 - CMP14+EGFR; Future - CBC with Differential/Platelet; Future - Hemoglobin A1c; Future  5. Hyperlipidemia due to type 2 diabetes mellitus ([employer]) Uncontrolled Due for lipid panel Will adjust regimen once result is obtained - LP+Non-HDL Cholesterol; Future - atorvastatin (LIPITOR) 40 MG tablet; Take 1 tablet (40 mg total) by mouth daily at 6pm or later after evening meal  Dispense: 90 tablet; Refill: 1  6. Acute on chronic left systolic heart failure ([place]) See #1 above - ECHOCARDIOGRAM COMPLETE; Future  7. Adhesive capsulitis of right shoulder Unable to take NSAIDs due to CHF Offered to prescribe prednisone for 5 days but she declines Keep appointment with orthopedic - traMADol (ULTRAM) 50 MG tablet; Take 1 tablet (50 mg total) by mouth every 12 (twelve) hours as needed.  Dispense: 30 tablet; Refill: 0  8. Musculoskeletal back pain Status post ESI in the past Pain is uncontrolled Will provide short course of tramadol once Ortho visit. - traMADol (ULTRAM) 50 MG tablet; Take 1 tablet (50 mg total) by mouth every 12 (twelve) hours as needed.  Dispense: 30 tablet; Refill: 0   Follow Up Instructions: 3 months. Advised to make appointment when she comes in for her labs which have been scheduled in 2 weeks She is due for health maintenance topics including mammogram and colonoscopy however she would like to hold off on this until her next visit as she has a lot of appointments coming up.   I discussed the assessment and treatment plan with the  patient. The patient was provided an opportunity to ask questions and all were answered. The patient agreed with the plan and demonstrated an understanding of the instructions.   The patient was advised to call back or seek an in-person evaluation if the symptoms worsen or if the condition fails to improve as anticipated.     I provided 22 minutes total of Telehealth time during this encounter including median intraservice time, reviewing previous notes, investigations, ordering medications, medical decision making, coordinating care and patient verbalized understanding at the end of the visit.     Charlott Rakes, MD, FAAFP. [facility] and [place], [place]   12/02/2022, 3:11 PM

## 2022-12-02 NOTE — Telephone Encounter (Signed)
Please schedule echocardiogram.  Thanks.

## 2022-12-03 ENCOUNTER — Telehealth: Payer: Self-pay | Admitting: *Deleted

## 2022-12-03 ENCOUNTER — Other Ambulatory Visit: Payer: Self-pay

## 2022-12-03 ENCOUNTER — Other Ambulatory Visit (HOSPITAL_COMMUNITY): Payer: Self-pay

## 2022-12-03 NOTE — Patient Outreach (Signed)
  Care Coordination  Potential Health Equity Plan Participant  12/03/2022 Name: Sabrina Ortiz MRN: [id number] DOB: [birth year]   Care Coordination Outreach Attempts:  A second unsuccessful outreach was attempted today to offer the patient with information about available care coordination services as a benefit of their health plan.     Follow Up Plan:  Additional outreach attempts will be made to offer the patient care coordination information and services.   Encounter Outcome:  No Answer   Care Coordination Interventions:  No, not indicated    Cythnia Osmun C. Myrtie Neither, MSN, [facility] Gerontological Nurse Practitioner [facility] Care Management [phone]

## 2022-12-04 ENCOUNTER — Other Ambulatory Visit (HOSPITAL_COMMUNITY): Payer: Self-pay

## 2022-12-07 NOTE — Telephone Encounter (Signed)
CHO has been scheduled and VM was left for patient and mychart message was sent also.

## 2022-12-08 ENCOUNTER — Telehealth: Payer: Self-pay | Admitting: *Deleted

## 2022-12-08 NOTE — Progress Notes (Signed)
  Care Coordination  Outreach Note  12/08/2022 Name: Sabrina Ortiz MRN: [id number] DOB: 09/10/1970   Care Coordination Outreach Attempts: An unsuccessful telephone outreach was attempted today to offer the patient information about available care coordination services as a benefit of their health plan.   Follow Up Plan:  Additional outreach attempts will be made to offer the patient care coordination information and services.   Encounter Outcome:  No Answer  [place]  Direct Dial: [phone]

## 2022-12-09 NOTE — Progress Notes (Signed)
  Care Coordination  Outreach Note  12/09/2022 Name: Sabrina Ortiz MRN: [id number] DOB: 1970-12-09   Care Coordination Outreach Attempts: A second unsuccessful outreach was attempted today to offer the patient with information about available care coordination services as a benefit of their health plan.     Follow Up Plan:  Additional outreach attempts will be made to offer the patient care coordination information and services.   Encounter Outcome:  No Answer  [place]  Direct Dial: [phone]

## 2022-12-14 NOTE — Progress Notes (Signed)
  Care Coordination  Outreach Note  12/14/2022 Name: Sabrina Ortiz MRN: [id number] DOB: [birth year]   Care Coordination Outreach Attempts: A third unsuccessful outreach was attempted today to offer the patient with information about available care coordination services as a benefit of their health plan.   Follow Up Plan:  No further outreach attempts will be made at this time. We have been unable to contact the patient to offer or enroll patient in care coordination services  Encounter Outcome:  No Answer  [place]  Care Coordination Care Guide  Direct Dial: [phone]

## 2022-12-15 ENCOUNTER — Ambulatory Visit: Payer: Medicare HMO | Admitting: Orthopaedic Surgery

## 2022-12-17 ENCOUNTER — Other Ambulatory Visit (HOSPITAL_COMMUNITY): Payer: Self-pay

## 2022-12-24 ENCOUNTER — Ambulatory Visit (HOSPITAL_COMMUNITY): Admission: RE | Admit: 2022-12-24 | Payer: Medicare HMO | Source: Ambulatory Visit

## 2022-12-31 ENCOUNTER — Telehealth: Payer: Self-pay | Admitting: Family Medicine

## 2022-12-31 NOTE — Telephone Encounter (Signed)
Called patient to schedule Medicare Annual Wellness Visit (AWV). Left message for patient to call back and schedule Medicare Annual Wellness Visit (AWV).  Last date of AWV:   awvi 11/05/17 per [facility]   If any questions, please contact me at [phone].  Thank you ,  Rudell Cobb AWV direct phone # [phone]

## 2023-01-07 ENCOUNTER — Other Ambulatory Visit: Payer: Self-pay

## 2023-01-07 ENCOUNTER — Other Ambulatory Visit (HOSPITAL_COMMUNITY): Payer: Self-pay

## 2023-01-08 ENCOUNTER — Other Ambulatory Visit: Payer: Self-pay

## 2023-01-08 ENCOUNTER — Other Ambulatory Visit: Payer: Self-pay | Admitting: Family Medicine

## 2023-01-08 DIAGNOSIS — K219 Gastro-esophageal reflux disease without esophagitis: Secondary | ICD-10-CM

## 2023-01-08 MED ORDER — OMEPRAZOLE 20 MG PO CPDR
20.0000 mg | DELAYED_RELEASE_CAPSULE | Freq: Every day | ORAL | 2 refills | Status: DC
  Filled 2023-01-08: qty 30, 30d supply, fill #0
  Filled 2023-02-15: qty 30, 30d supply, fill #1
  Filled 2023-04-02: qty 30, 30d supply, fill #2

## 2023-01-14 DIAGNOSIS — G4733 Obstructive sleep apnea (adult) (pediatric): Secondary | ICD-10-CM | POA: Diagnosis not present

## 2023-01-15 ENCOUNTER — Telehealth: Payer: Self-pay | Admitting: Family Medicine

## 2023-01-15 NOTE — Telephone Encounter (Signed)
Pt states that her CPAP machine is broken and she reached out to the company and they informed her that she needs a new script sent it.

## 2023-01-15 NOTE — Telephone Encounter (Signed)
Copied from CRM [id number]. Topic: General - Other >> Jan 14, 2023  4:56 PM [name] wrote: Reason for CRM: The patient has called to request an order for a new CPAP machine  The patient would like the order submitted to [employer]  Please contact the patient further if needed

## 2023-01-18 ENCOUNTER — Telehealth: Payer: Self-pay | Admitting: Family Medicine

## 2023-01-18 MED ORDER — MISC. DEVICES MISC: 0 refills | Status: AC

## 2023-01-18 NOTE — Telephone Encounter (Signed)
Prescription has been written.

## 2023-01-18 NOTE — Addendum Note (Signed)
Addended by: Hoy Register on: 01/18/2023 05:15 PM   Modules accepted: Orders

## 2023-01-18 NOTE — Telephone Encounter (Signed)
Contacted Sabrina Ortiz to schedule their annual wellness visit. Appointment made for 01/20/2023.  Thank you,  [facility] Support [facility] Direct dial  [phone]

## 2023-01-19 NOTE — Telephone Encounter (Signed)
Order has been faxed to [employer]. 

## 2023-01-20 ENCOUNTER — Ambulatory Visit: Payer: Medicare HMO | Attending: Family Medicine

## 2023-01-20 VITALS — Ht 66.0 in | Wt 176.0 lb

## 2023-01-20 DIAGNOSIS — Z1211 Encounter for screening for malignant neoplasm of colon: Secondary | ICD-10-CM

## 2023-01-20 DIAGNOSIS — Z1231 Encounter for screening mammogram for malignant neoplasm of breast: Secondary | ICD-10-CM

## 2023-01-20 DIAGNOSIS — Z0000 Encounter for general adult medical examination without abnormal findings: Secondary | ICD-10-CM | POA: Diagnosis not present

## 2023-01-20 NOTE — Patient Instructions (Signed)
Sabrina Ortiz , Thank you for taking time to come for your Medicare Wellness Visit. I appreciate your ongoing commitment to your health goals. Please review the following plan we discussed and let me know if I can assist you in the future.   These are the goals we discussed:  Goals      Increase physical activity        This is a list of the screening recommended for you and due dates:  Health Maintenance  Topic Date Due   COVID-19 Vaccine (1) Never done   Complete foot exam   Never done   Eye exam for diabetics  Never done   DTaP/Tdap/Td vaccine (1 - Tdap) Never done   Zoster (Shingles) Vaccine (1 of 2) Never done   Colon Cancer Screening  Never done   Mammogram  03/05/2021   Hemoglobin A1C  01/26/2023   Flu Shot  04/08/2023   Pap Smear  06/05/2023   Yearly kidney function blood test for diabetes  08/07/2023   Yearly kidney health urinalysis for diabetes  08/07/2023   Medicare Annual Wellness Visit  01/20/2024   Hepatitis C Screening: USPSTF Recommendation to screen - Ages 18-79 yo.  Completed   HIV Screening  Completed   HPV Vaccine  Aged Out    Advanced directives: Information on Advanced Care Planning can be found at [facility] [place] Advance Health Care Directives Advance Health Care Directives ([email or web address]    Conditions/risks identified: Aim for 30 minutes of exercise or brisk walking, 6-8 glasses of water, and 5 servings of fruits and vegetables each day.   Next appointment: Follow up in one year for your annual wellness visit.   The number to schedule your mammogram at [facility] is [phone]   Preventive Care 40-64 Years, Female Preventive care refers to lifestyle choices and visits with your health care provider that can promote health and wellness. What does preventive care include? A yearly physical exam. This is also called an annual well check. Dental exams once or twice a year. Routine eye exams. Ask your health care provider how often  you should have your eyes checked. Personal lifestyle choices, including: Daily care of your teeth and gums. Regular physical activity. Eating a healthy diet. Avoiding tobacco and drug use. Limiting alcohol use. Practicing safe sex. Taking low-dose aspirin daily starting at age 60. Taking vitamin and mineral supplements as recommended by your health care provider. What happens during an annual well check? The services and screenings done by your health care provider during your annual well check will depend on your age, overall health, lifestyle risk factors, and family history of disease. Counseling  Your health care provider may ask you questions about your: Alcohol use. Tobacco use. Drug use. Emotional well-being. Home and relationship well-being. Sexual activity. Eating habits. Work and work Astron[employer]. Method of birth control. Menstrual cycle. Pregnancy history. Screening  You may have the following tests or measurements: Height, weight, and BMI. Blood pressure. Lipid and cholesterol levels. These may be checked every 5 years, or more frequently if you are over 31 years old. Skin check. Lung cancer screening. You may have this screening every year starting at age 43 if you have a 30-pack-year history of smoking and currently smoke or have quit within the past 15 years. Fecal occult blood test (FOBT) of the stool. You may have this test every year starting at age 57. Flexible sigmoidoscopy or colonoscopy. You may have a sigmoidoscopy every 5 years or  a colonoscopy every 10 years starting at age 42. Hepatitis C blood test. Hepatitis B blood test. Sexually transmitted disease (STD) testing. Diabetes screening. This is done by checking your blood sugar (glucose) after you have not eaten for a while (fasting). You may have this done every 1-3 years. Mammogram. This may be done every 1-2 years. Talk to your health care provider about when you should start having regular  mammograms. This may depend on whether you have a family history of breast cancer. BRCA-related cancer screening. This may be done if you have a family history of breast, ovarian, tubal, or peritoneal cancers. Pelvic exam and Pap test. This may be done every 3 years starting at age 7. Starting at age 27, this may be done every 5 years if you have a Pap test in combination with an HPV test. Bone density scan. This is done to screen for osteoporosis. You may have this scan if you are at high risk for osteoporosis. Discuss your test results, treatment options, and if necessary, the need for more tests with your health care provider. Vaccines  Your health care provider may recommend certain vaccines, such as: Influenza vaccine. This is recommended every year. Tetanus, diphtheria, and acellular pertussis (Tdap, Td) vaccine. You may need a Td booster every 10 years. Zoster vaccine. You may need this after age 71. Pneumococcal 13-valent conjugate (PCV13) vaccine. You may need this if you have certain conditions and were not previously vaccinated. Pneumococcal polysaccharide (PPSV23) vaccine. You may need one or two doses if you smoke cigarettes or if you have certain conditions. Talk to your health care provider about which screenings and vaccines you need and how often you need them. This information is not intended to replace advice given to you by your health care provider. Make sure you discuss any questions you have with your health care provider. Document Released: 09/20/2015 Document Revised: 05/13/2016 Document Reviewed: 06/25/2015 Elsevier Interactive Patient Education  2017 [employer].    Fall Prevention in the Home Falls can cause injuries. They can happen to people of all ages. There are many things you can do to make your home safe and to help prevent falls. What can I do on the outside of my home? Regularly fix the edges of walkways and driveways and fix any cracks. Remove anything  that might make you trip as you walk through a door, such as a raised step or threshold. Trim any bushes or trees on the path to your home. Use bright outdoor lighting. Clear any walking paths of anything that might make someone trip, such as rocks or tools. Regularly check to see if handrails are loose or broken. Make sure that both sides of any steps have handrails. Any raised decks and porches should have guardrails on the edges. Have any leaves, snow, or ice cleared regularly. Use sand or salt on walking paths during winter. Clean up any spills in your garage right away. This includes oil or grease spills. What can I do in the bathroom? Use night lights. Install grab bars by the toilet and in the tub and shower. Do not use towel bars as grab bars. Use non-skid mats or decals in the tub or shower. If you need to sit down in the shower, use a plastic, non-slip stool. Keep the floor dry. Clean up any water that spills on the floor as soon as it happens. Remove soap buildup in the tub or shower regularly. Attach bath mats securely with double-sided non-slip rug  tape. Do not have throw rugs and other things on the floor that can make you trip. What can I do in the bedroom? Use night lights. Make sure that you have a light by your bed that is easy to reach. Do not use any sheets or blankets that are too big for your bed. They should not hang down onto the floor. Have a firm chair that has side arms. You can use this for support while you get dressed. Do not have throw rugs and other things on the floor that can make you trip. What can I do in the kitchen? Clean up any spills right away. Avoid walking on wet floors. Keep items that you use a lot in easy-to-reach places. If you need to reach something above you, use a strong step stool that has a grab bar. Keep electrical cords out of the way. Do not use floor polish or wax that makes floors slippery. If you must use wax, use non-skid floor  wax. Do not have throw rugs and other things on the floor that can make you trip. What can I do with my stairs? Do not leave any items on the stairs. Make sure that there are handrails on both sides of the stairs and use them. Fix handrails that are broken or loose. Make sure that handrails are as long as the stairways. Check any carpeting to make sure that it is firmly attached to the stairs. Fix any carpet that is loose or worn. Avoid having throw rugs at the top or bottom of the stairs. If you do have throw rugs, attach them to the floor with carpet tape. Make sure that you have a light switch at the top of the stairs and the bottom of the stairs. If you do not have them, ask someone to add them for you. What else can I do to help prevent falls? Wear shoes that: Do not have high heels. Have rubber bottoms. Are comfortable and fit you well. Are closed at the toe. Do not wear sandals. If you use a stepladder: Make sure that it is fully opened. Do not climb a closed stepladder. Make sure that both sides of the stepladder are locked into place. Ask someone to hold it for you, if possible. Clearly mark and make sure that you can see: Any grab bars or handrails. First and last steps. Where the edge of each step is. Use tools that help you move around (mobility aids) if they are needed. These include: Canes. Walkers. Scooters. Crutches. Turn on the lights when you go into a dark area. Replace any light bulbs as soon as they burn out. Set up your furniture so you have a clear path. Avoid moving your furniture around. If any of your floors are uneven, fix them. If there are any pets around you, be aware of where they are. Review your medicines with your doctor. Some medicines can make you feel dizzy. This can increase your chance of falling. Ask your doctor what other things that you can do to help prevent falls. This information is not intended to replace advice given to you by your  health care provider. Make sure you discuss any questions you have with your health care provider. Document Released: 06/20/2009 Document Revised: 01/30/2016 Document Reviewed: 09/28/2014 Elsevier Interactive Patient Education  2017 [employer].

## 2023-01-20 NOTE — Progress Notes (Signed)
Subjective:   Sabrina Ortiz is a 52 y.o. female who presents for an Initial Medicare Annual Wellness Visit.  I connected with  Ahliana Sharif Schild on 01/20/23 by a audio enabled telemedicine application and verified that I am speaking with the correct person using two identifiers.  Patient Location: Home  Provider Location: Home Office  I discussed the limitations of evaluation and management by telemedicine. The patient expressed understanding and agreed to proceed.  Review of Systems     Cardiac Risk Factors include: sedentary lifestyle;obesity (BMI >30kg/m2);dyslipidemia;diabetes mellitus     Objective:    Today's Vitals   01/20/23 2154  Weight: 176 lb (79.8 kg)  Height: 5\' 6"  (1.676 m)   Body mass index is 28.41 kg/m.     01/20/2023   10:03 PM 09/16/2018   10:16 PM 09/14/2018    8:23 PM 04/30/2018    8:29 PM 04/11/2018   11:00 AM 02/27/2018    8:49 PM 03/31/2017    2:05 PM  Advanced Directives  Does Patient Have a Medical Advance Directive? No  No No No No No  Does patient want to make changes to medical advance directive?    No - Patient declined     Would patient like information on creating a medical advance directive? Yes (MAU/Ambulatory/Procedural Areas - Information given) No - Patient declined  No - Patient declined No - Patient declined No - Patient declined     Current Medications (verified) Outpatient Encounter Medications as of 01/20/2023  Medication Sig   aspirin EC 81 MG EC tablet Take 1 tablet (81 mg total) by mouth daily.   atorvastatin (LIPITOR) 40 MG tablet Take 1 tablet (40 mg total) by mouth daily at 6pm or later after evening meal   Blood Glucose Monitoring Suppl (ONETOUCH VERIO) w/Device KIT 1 Units by Does not apply route in the morning and at bedtime.   busPIRone (BUSPAR) 10 MG tablet Take 1 tablet (10 mg total) by mouth 3 (three) times daily. (To treat anxiety).   carvedilol (COREG) 6.25 MG tablet Take 1 tablet (6.25 mg total) by mouth 2 (two)  times daily with a meal.   dapagliflozin propanediol (FARXIGA) 10 MG TABS tablet Take 1 tablet (10 mg total) by mouth daily before breakfast.   furosemide (LASIX) 20 MG tablet Take 1 tablet (20 mg total) by mouth daily.   glucose blood (ONETOUCH VERIO) test strip Use to check blood sugar twice daily. Dx E11.9   Lancets (ONETOUCH DELICA PLUS LANCET33G) MISC Use to check blood sugar twice daily.   metFORMIN (GLUCOPHAGE) 500 MG tablet Take 1 tablet (500 mg total) by mouth daily with breakfast.   Misc. Devices MISC CPAP, 11 cm of water.  Diagnosis obstructive sleep apnea.   omeprazole (PRILOSEC) 20 MG capsule Take 1 capsule (20 mg total) by mouth daily.   potassium chloride (KLOR-CON) 10 MEQ tablet Take 1 tablet (10 mEq total) by mouth daily.   sacubitril-valsartan (ENTRESTO) 49-51 MG Take 1 tablet by mouth 2 (two) times daily.   traMADol (ULTRAM) 50 MG tablet Take 1 tablet (50 mg total) by mouth every 12 (twelve) hours as needed.   Vitamin D, Ergocalciferol, (DRISDOL) 1.25 MG (50000 UNIT) CAPS capsule Take 1 capsule (50,000 Units total) by mouth every 7 (seven) days.   [DISCONTINUED] potassium chloride (K-DUR,KLOR-CON) 10 MEQ tablet Take 1 tablet (10 mEq total) by mouth daily.   No facility-administered encounter medications on file as of 01/20/2023.    Allergies (verified) Shellfish allergy  History: Past Medical History:  Diagnosis Date   Angio-edema    Anxiety    Chronic back pain    COPD (chronic obstructive pulmonary disease) (HCC)    Depression    Hypertension    Mental disorder    PTSD (post-traumatic stress disorder)    Urticaria    Past Surgical History:  Procedure Laterality Date   CYST REMOVAL HAND     RIGHT/LEFT HEART CATH AND CORONARY ANGIOGRAPHY N/A 04/12/2018   Procedure: RIGHT/LEFT HEART CATH AND CORONARY ANGIOGRAPHY;  Surgeon: Orpah Cobb, MD;  Location: [facility];  Service: Cardiovascular;  Laterality: N/A;   Family History  Problem Relation Age of  Onset   Breast cancer Mother    Breast cancer Maternal Aunt    Breast cancer Maternal Grandmother    Social History   Socioeconomic History   Marital status: Single    Spouse name: Not on file   Number of children: Not on file   Years of education: Not on file   Highest education level: Not on file  Occupational History   Not on file  Tobacco Use   Smoking status: Never   Smokeless tobacco: Never  Vaping Use   Vaping Use: Never used  Substance and Sexual Activity   Alcohol use: No   Drug use: No   Sexual activity: Not Currently  Other Topics Concern   Not on file  Social History Narrative   Not on file   Social Determinants of Health   Financial Resource Strain: Low Risk  (01/20/2023)   Overall Financial Resource Strain (CARDIA)    Difficulty of Paying Living Expenses: Not hard at all  Food Insecurity: No Food Insecurity (01/20/2023)   Hunger Vital Sign    Worried About Running Out of Food in the Last Year: Never true    Ran Out of Food in the Last Year: Never true  Transportation Needs: No Transportation Needs (01/20/2023)   PRAPARE - Administrator, Civil Service (Medical): No    Lack of Transportation (Non-Medical): No  Physical Activity: Inactive (01/20/2023)   Exercise Vital Sign    Days of Exercise per Week: 0 days    Minutes of Exercise per Session: 0 min  Stress: No Stress Concern Present (01/20/2023)   [employer] of Occupational Health - Occupational Stress Questionnaire    Feeling of Stress : Only a little  Social Connections: Moderately Isolated (01/20/2023)   Social Connection and Isolation Panel [NHANES]    Frequency of Communication with Friends and Family: More than three times a week    Frequency of Social Gatherings with Friends and Family: Three times a week    Attends Religious Services: More than 4 times per year    Active Member of Clubs or Organizations: No    Attends Banker Meetings: Never    Marital Status:  Never married    Tobacco Counseling Counseling given: Not Answered   Clinical Intake:  Pre-visit preparation completed: Yes  Pain : No/denies pain     Diabetes: Yes CBG done?: No Did pt. bring in CBG monitor from home?: No  How often do you need to have someone help you when you read instructions, pamphlets, or other written materials from your doctor or pharmacy?: 1 - Never  Diabetic?Yes   Nutrition Risk Assessment:  Has the patient had any N/V/D within the last 2 months?  No  Does the patient have any non-healing wounds?  No  Has the patient had  any unintentional weight loss or weight gain?  No   Diabetes:  Is the patient diabetic?  Yes  If diabetic, was a CBG obtained today?  No  Did the patient bring in their glucometer from home?  No  How often do you monitor your CBG's? daily.   Financial Strains and Diabetes Management:  Are you having any financial strains with the device, your supplies or your medication? No .  Does the patient want to be seen by Chronic Care Management for management of their diabetes?  No  Would the patient like to be referred to a Nutritionist or for Diabetic Management?  No   Diabetic Exams:  Diabetic Eye Exam: Completed records requested Diabetic Foot Exam: Overdue, Pt has been advised about the importance in completing this exam. Pt is scheduled for diabetic foot exam on at next office visit .   Interpreter Needed?: No  Information entered by :: Kandis Fantasia LPN   Activities of Daily Living    01/20/2023   10:02 PM  In your present state of health, do you have any difficulty performing the following activities:  Hearing? 0  Vision? 0  Difficulty concentrating or making decisions? 0  Walking or climbing stairs? 1  Dressing or bathing? 0  Doing errands, shopping? 0  Preparing Food and eating ? N  Using the Toilet? N  In the past six months, have you accidently leaked urine? N  Do you have problems with loss of bowel  control? N  Managing your Medications? N  Managing your Finances? N  Housekeeping or managing your Housekeeping? N    Patient Care Team: Hoy Register, MD as PCP - General (Family Medicine)  Indicate any recent Medical Services you may have received from other than [facility] providers in the past year (date may be approximate).     Assessment:   This is a routine wellness examination for Sabrina Ortiz.  Hearing/Vision screen Hearing Screening - Comments:: Denies hearing difficulties   Vision Screening - Comments::  up to date with routine eye exams with [facility]    Dietary issues and exercise activities discussed: Current Exercise Habits: The patient does not participate in regular exercise at present   Goals Addressed             This Visit's Progress    Increase physical activity        Depression Screen    01/20/2023   10:01 PM 08/06/2022    3:42 PM 07/29/2022   10:32 AM 07/14/2021    3:00 PM 01/14/2021    2:21 PM 03/19/2020    3:34 PM 01/02/2020    2:23 PM  PHQ 2/9 Scores  PHQ - 2 Score 0 2 1 2 4 4 5   PHQ- 9 Score  7 3 10 13 14 13     Fall Risk    01/20/2023   10:02 PM 08/06/2022    3:42 PM 07/14/2021    3:00 PM 05/17/2019    3:16 PM 11/11/2018    3:39 PM  Fall Risk   Falls in the past year? 0 0 0 0 0  Number falls in past yr: 0 0 0    Injury with Fall? 0 0     Risk for fall due to : No Fall Risks No Fall Risks     Follow up Falls prevention discussed;Education provided;Falls evaluation completed  Falls evaluation completed      FALL RISK PREVENTION PERTAINING TO THE HOME:  Any stairs in or around  the home? No  If so, are there any without handrails? No  Home free of loose throw rugs in walkways, pet beds, electrical cords, etc? Yes  Adequate lighting in your home to reduce risk of falls? Yes   ASSISTIVE DEVICES UTILIZED TO PREVENT FALLS:  Life alert? No  Use of a cane, walker or w/c? No  Grab bars in the bathroom? No  Shower chair or bench in  shower? No  Elevated toilet seat or a handicapped toilet? No   TIMED UP AND GO:  Was the test performed? No . Telephonic visit   Cognitive Function:        01/20/2023   10:03 PM  6CIT Screen  What Year? 0 points  What month? 0 points  What time? 0 points  Count back from 20 0 points  Months in reverse 0 points  Repeat phrase 0 points  Total Score 0 points    Immunizations  There is no immunization history on file for this patient.  TDAP status: Due, Education has been provided regarding the importance of this vaccine. Advised may receive this vaccine at local pharmacy or Health Dept. Aware to provide a copy of the vaccination record if obtained from local pharmacy or Health Dept. Verbalized acceptance and understanding.  Pneumococcal vaccine status: Declined,  Education has been provided regarding the importance of this vaccine but patient still declined. Advised may receive this vaccine at local pharmacy or Health Dept. Aware to provide a copy of the vaccination record if obtained from local pharmacy or Health Dept. Verbalized acceptance and understanding.   Covid-19 vaccine status: Declined, Education has been provided regarding the importance of this vaccine but patient still declined. Advised may receive this vaccine at local pharmacy or Health Dept.or vaccine clinic. Aware to provide a copy of the vaccination record if obtained from local pharmacy or Health Dept. Verbalized acceptance and understanding.  Qualifies for Shingles Vaccine? Yes   Zostavax completed No   Shingrix Completed?: No.    Education has been provided regarding the importance of this vaccine. Patient has been advised to call insurance company to determine out of pocket expense if they have not yet received this vaccine. Advised may also receive vaccine at local pharmacy or Health Dept. Verbalized acceptance and understanding.  Screening Tests Health Maintenance  Topic Date Due   COVID-19 Vaccine (1)  Never done   FOOT EXAM  Never done   OPHTHALMOLOGY EXAM  Never done   DTaP/Tdap/Td (1 - Tdap) Never done   Zoster Vaccines- Shingrix (1 of 2) Never done   COLONOSCOPY (Pts 45-22yrs Insurance coverage will need to be confirmed)  Never done   MAMMOGRAM  03/05/2021   HEMOGLOBIN A1C  01/26/2023   INFLUENZA VACCINE  04/08/2023   PAP SMEAR-Modifier  06/05/2023   Diabetic kidney evaluation - eGFR measurement  08/07/2023   Diabetic kidney evaluation - Urine ACR  08/07/2023   Medicare Annual Wellness (AWV)  01/20/2024   Hepatitis C Screening  Completed   HIV Screening  Completed   HPV VACCINES  Aged Out    Health Maintenance  Health Maintenance Due  Topic Date Due   COVID-19 Vaccine (1) Never done   FOOT EXAM  Never done   OPHTHALMOLOGY EXAM  Never done   DTaP/Tdap/Td (1 - Tdap) Never done   Zoster Vaccines- Shingrix (1 of 2) Never done   COLONOSCOPY (Pts 45-26yrs Insurance coverage will need to be confirmed)  Never done   MAMMOGRAM  03/05/2021  Colorectal cancer screening: Referral to GI placed (cologuard ordered today). Pt aware the office will call re: appt.  Mammogram status: Ordered today. Pt provided with contact info and advised to call to schedule appt.   Lung Cancer Screening: (Low Dose CT Chest recommended if Age 48-80 years, 30 pack-year currently smoking OR have quit w/in 15years.) does not qualify.   Lung Cancer Screening Referral: n/a  Additional Screening:  Hepatitis C Screening: does qualify; Completed 05/01/20  Vision Screening: Recommended annual ophthalmology exams for early detection of glaucoma and other disorders of the eye. Is the patient up to date with their annual eye exam?  Yes  Who is the provider or what is the name of the office in which the patient attends annual eye exams? [facility] [place]) If pt is not established with a provider, would they like to be referred to a provider to establish care? No .   Dental Screening: Recommended  annual dental exams for proper oral hygiene  Community Resource Referral / Chronic Care Management: CRR required this visit?  No   CCM required this visit?  No      Plan:     I have personally reviewed and noted the following in the patient's chart:   Medical and social history Use of alcohol, tobacco or illicit drugs  Current medications and supplements including opioid prescriptions. Patient is not currently taking opioid prescriptions. Functional ability and status Nutritional status Physical activity Advanced directives List of other physicians Hospitalizations, surgeries, and ER visits in previous 12 months Vitals Screenings to include cognitive, depression, and falls Referrals and appointments  In addition, I have reviewed and discussed with patient certain preventive protocols, quality metrics, and best practice recommendations. A written personalized care plan for preventive services as well as general preventive health recommendations were provided to patient.     Durwin Nora, [place]   9/52/8413   Due to this being a virtual visit, the after visit summary with patients personalized plan was offered to patient via mail or my-chart.  Patient would like to access on my-chart  Nurse Notes: Patient is asking for assistance in getting new cpap machine through [facility]

## 2023-01-25 ENCOUNTER — Telehealth: Payer: Self-pay | Admitting: Family Medicine

## 2023-01-25 ENCOUNTER — Ambulatory Visit: Payer: Medicare HMO | Attending: Family Medicine | Admitting: Family Medicine

## 2023-01-25 DIAGNOSIS — G4733 Obstructive sleep apnea (adult) (pediatric): Secondary | ICD-10-CM | POA: Diagnosis not present

## 2023-01-25 NOTE — Progress Notes (Signed)
Virtual Visit via Telephone Note  I connected with Sabrina Ortiz, on 01/25/2023 at 1:34 PM by telephone and verified that I am speaking with the correct person using two identifiers.   Consent: I discussed the limitations, risks, security and privacy concerns of performing an evaluation and management service by telephone and the availability of in person appointments. I also discussed with the patient that there may be a patient responsible charge related to this service. The patient expressed understanding and agreed to proceed.   Location of Patient: Home  Location of Provider: Clinic   Persons participating in Telemedicine visit: Sabrina Ortiz Dr. Alvis Lemmings     History of Present Illness: Sabrina Ortiz is a 52 y.o. year old female with a history of hypertension, hyperlipidemia, GERD, anxiety and depression, HFrEF (EF 25% from echo 09/2018), type 2 diabetes mellitus (A1c 5.8), Sleep Apnea (on a CPAP machine) who presents today for a follow-up visit.   She received her CPAP in 2019 positive sleep study. Uses it every night but when it does not work properly she does not use it about 20% of the time.  She endorses having a refreshed sleep when she uses her CPAP machine and does not need to take sedatives. She needs a new machine because sometimes her machine does not work properly.  She now has new insurance and was informed that this would cover her CPAP machine.   Past Medical History:  Diagnosis Date   Angio-edema    Anxiety    Chronic back pain    COPD (chronic obstructive pulmonary disease) (HCC)    Depression    Hypertension    Mental disorder    PTSD (post-traumatic stress disorder)    Urticaria    Allergies  Allergen Reactions   Shellfish Allergy Anaphylaxis    Current Outpatient Medications on File Prior to Visit  Medication Sig Dispense Refill   aspirin EC 81 MG EC tablet Take 1 tablet (81 mg total) by mouth daily.     atorvastatin (LIPITOR)  40 MG tablet Take 1 tablet (40 mg total) by mouth daily at 6pm or later after evening meal 90 tablet 1   Blood Glucose Monitoring Suppl (ONETOUCH VERIO) w/Device KIT 1 Units by Does not apply route in the morning and at bedtime. 1 kit 0   busPIRone (BUSPAR) 10 MG tablet Take 1 tablet (10 mg total) by mouth 3 (three) times daily. (To treat anxiety). 90 tablet 0   carvedilol (COREG) 6.25 MG tablet Take 1 tablet (6.25 mg total) by mouth 2 (two) times daily with a meal. 60 tablet 3   dapagliflozin propanediol (FARXIGA) 10 MG TABS tablet Take 1 tablet (10 mg total) by mouth daily before breakfast. 90 tablet 1   furosemide (LASIX) 20 MG tablet Take 1 tablet (20 mg total) by mouth daily. 90 tablet 1   glucose blood (ONETOUCH VERIO) test strip Use to check blood sugar twice daily. Dx E11.9 100 each 0   Lancets (ONETOUCH DELICA PLUS LANCET33G) MISC Use to check blood sugar twice daily. 100 each 0   metFORMIN (GLUCOPHAGE) 500 MG tablet Take 1 tablet (500 mg total) by mouth daily with breakfast. 90 tablet 1   Misc. Devices MISC CPAP, 11 cm of water.  Diagnosis obstructive sleep apnea. 1 each 0   omeprazole (PRILOSEC) 20 MG capsule Take 1 capsule (20 mg total) by mouth daily. 30 capsule 2   potassium chloride (KLOR-CON) 10 MEQ tablet Take 1 tablet (10 mEq total)  by mouth daily. 30 tablet 3   sacubitril-valsartan (ENTRESTO) 49-51 MG Take 1 tablet by mouth 2 (two) times daily. 60 tablet 2   traMADol (ULTRAM) 50 MG tablet Take 1 tablet (50 mg total) by mouth every 12 (twelve) hours as needed. 30 tablet 0   Vitamin D, Ergocalciferol, (DRISDOL) 1.25 MG (50000 UNIT) CAPS capsule Take 1 capsule (50,000 Units total) by mouth every 7 (seven) days. 16 capsule 0   [DISCONTINUED] potassium chloride (K-DUR,KLOR-CON) 10 MEQ tablet Take 1 tablet (10 mEq total) by mouth daily. 30 tablet 3   No current facility-administered medications on file prior to visit.    ROS: See HPI  Observations/Objective: Awake, alert,  oriented x3 Not in acute distress Normal mood      Latest Ref Rng & Units 08/06/2022    4:12 PM 05/01/2020    4:52 PM 01/26/2020    2:54 PM  CMP  Glucose 70 - 99 mg/dL 161  096  045   BUN 6 - 24 mg/dL 13  8  7    Creatinine 0.57 - 1.00 mg/dL 4.09  8.11  9.14   Sodium 134 - 144 mmol/L 140  140  140   Potassium 3.5 - 5.2 mmol/L 3.9  4.0  4.0   Chloride 96 - 106 mmol/L 102  103  104   CO2 20 - 29 mmol/L   21   Calcium 8.7 - 10.2 mg/dL 9.4  9.6  9.5   Total Protein 6.0 - 8.5 g/dL 7.3  7.6  7.2   Total Bilirubin 0.0 - 1.2 mg/dL 0.3  0.6  0.5   Alkaline Phos 44 - 121 IU/L 61  68  64   AST 0 - 40 IU/L 73  64  49   ALT 0 - 32 IU/L   62     Lipid Panel     Component Value Date/Time   CHOL 294 (H) 08/06/2022 1612   TRIG 168 (H) 08/06/2022 1612   HDL 81 08/06/2022 1612   CHOLHDL 3.6 08/06/2022 1612   CHOLHDL 4.0 09/17/2018 0415   VLDL 25 09/17/2018 0415   LDLCALC 183 (H) 08/06/2022 1612   LABVLDL 30 08/06/2022 1612    Lab Results  Component Value Date   HGBA1C 5.8 (A) 07/28/2022   HGBA1C 5.8 07/28/2022   HGBA1C 5.8 07/28/2022   HGBA1C 5.8 07/28/2022    Assessment and Plan: 1. Obstructive sleep apnea Controlled on CPAP machine She is requesting a new CPAP machine Prescription was previously written We will fax office notes to her DME company   Follow Up Instructions: Keep previously scheduled appointment   I discussed the assessment and treatment plan with the patient. The patient was provided an opportunity to ask questions and all were answered. The patient agreed with the plan and demonstrated an understanding of the instructions.   The patient was advised to call back or seek an in-person evaluation if the symptoms worsen or if the condition fails to improve as anticipated.     I provided 12 minutes total of non-face-to-face time during this encounter.   Hoy Register, MD, FAAFP. [facility] [place], [place] [phone]    01/25/2023, 1:34 PM

## 2023-01-25 NOTE — Telephone Encounter (Signed)
Mary with [employer] is calling because they received a prescription for a replacement CPAP machine for the pt. Corrie Dandy says they need notation stating that the pt is currently using and benefiting from a CPAP machine. Please follow up with pt. Corrie Dandy says this can be sent via fax at [phone]

## 2023-01-25 NOTE — Telephone Encounter (Signed)
Pt has appointment set for today to discuss.

## 2023-02-02 DIAGNOSIS — G4733 Obstructive sleep apnea (adult) (pediatric): Secondary | ICD-10-CM | POA: Diagnosis not present

## 2023-02-15 ENCOUNTER — Other Ambulatory Visit (HOSPITAL_COMMUNITY): Payer: Self-pay

## 2023-02-15 ENCOUNTER — Other Ambulatory Visit: Payer: Self-pay | Admitting: Family Medicine

## 2023-02-15 ENCOUNTER — Other Ambulatory Visit: Payer: Self-pay | Admitting: Physician Assistant

## 2023-02-15 ENCOUNTER — Other Ambulatory Visit: Payer: Self-pay

## 2023-02-15 DIAGNOSIS — I5022 Chronic systolic (congestive) heart failure: Secondary | ICD-10-CM

## 2023-02-15 DIAGNOSIS — F411 Generalized anxiety disorder: Secondary | ICD-10-CM

## 2023-02-15 DIAGNOSIS — E119 Type 2 diabetes mellitus without complications: Secondary | ICD-10-CM

## 2023-02-15 MED ORDER — ONETOUCH DELICA PLUS LANCET33G MISC
0 refills | Status: DC
Start: 2023-02-15 — End: 2024-07-21
  Filled 2023-02-15: qty 100, 50d supply, fill #0

## 2023-02-15 MED ORDER — ONETOUCH VERIO VI STRP
ORAL_STRIP | 0 refills | Status: DC
Start: 2023-02-15 — End: 2024-07-21
  Filled 2023-02-15: qty 100, 50d supply, fill #0

## 2023-02-15 MED ORDER — BUSPIRONE HCL 10 MG PO TABS
10.0000 mg | ORAL_TABLET | Freq: Three times a day (TID) | ORAL | 0 refills | Status: DC
Start: 2023-02-15 — End: 2023-06-20
  Filled 2023-02-15: qty 90, 30d supply, fill #0

## 2023-02-15 MED ORDER — POTASSIUM CHLORIDE ER 10 MEQ PO TBCR
10.0000 meq | EXTENDED_RELEASE_TABLET | Freq: Every day | ORAL | 3 refills | Status: DC
Start: 2023-02-15 — End: 2023-07-14
  Filled 2023-02-15: qty 30, 30d supply, fill #0
  Filled 2023-04-02: qty 30, 30d supply, fill #1
  Filled 2023-05-01: qty 30, 30d supply, fill #2
  Filled 2023-06-21: qty 30, 30d supply, fill #3

## 2023-02-17 DIAGNOSIS — E119 Type 2 diabetes mellitus without complications: Secondary | ICD-10-CM | POA: Diagnosis not present

## 2023-02-17 DIAGNOSIS — Z0100 Encounter for examination of eyes and vision without abnormal findings: Secondary | ICD-10-CM | POA: Diagnosis not present

## 2023-02-17 LAB — HM DIABETES EYE EXAM

## 2023-02-18 ENCOUNTER — Other Ambulatory Visit (HOSPITAL_COMMUNITY): Payer: Self-pay

## 2023-02-19 ENCOUNTER — Other Ambulatory Visit (HOSPITAL_COMMUNITY): Payer: Self-pay

## 2023-02-19 ENCOUNTER — Other Ambulatory Visit: Payer: Self-pay

## 2023-02-25 ENCOUNTER — Other Ambulatory Visit (HOSPITAL_COMMUNITY): Payer: Self-pay

## 2023-02-26 ENCOUNTER — Other Ambulatory Visit: Payer: Self-pay

## 2023-03-08 DIAGNOSIS — G4733 Obstructive sleep apnea (adult) (pediatric): Secondary | ICD-10-CM | POA: Diagnosis not present

## 2023-03-24 ENCOUNTER — Other Ambulatory Visit (HOSPITAL_BASED_OUTPATIENT_CLINIC_OR_DEPARTMENT_OTHER): Payer: Medicare HMO | Admitting: Pharmacist

## 2023-03-24 DIAGNOSIS — Z79899 Other long term (current) drug therapy: Secondary | ICD-10-CM

## 2023-03-24 NOTE — Progress Notes (Signed)
Pharmacy Quality Measure Review  This patient is appearing on a report for being at risk of failing the adherence measure for cholesterol (statin) medications this calendar year. She is current on her refills at the moment (last filled a 90-day supply on 01/08/2023), but only filled 30-day supplies 12/01/2022 and 09/15/22 of this year.   Encourage adherence. She has a follow-up with her PCP listed for later this month.   Butch Penny, PharmD, [place], CPP Clinical Pharmacist [facility] [phone]

## 2023-04-01 ENCOUNTER — Ambulatory Visit: Payer: Medicare HMO | Admitting: Family Medicine

## 2023-04-02 ENCOUNTER — Other Ambulatory Visit (HOSPITAL_COMMUNITY): Payer: Self-pay

## 2023-04-03 ENCOUNTER — Other Ambulatory Visit: Payer: Self-pay | Admitting: Family Medicine

## 2023-04-03 DIAGNOSIS — I5022 Chronic systolic (congestive) heart failure: Secondary | ICD-10-CM

## 2023-04-05 ENCOUNTER — Other Ambulatory Visit: Payer: Self-pay

## 2023-04-05 ENCOUNTER — Other Ambulatory Visit (HOSPITAL_COMMUNITY): Payer: Self-pay

## 2023-04-05 ENCOUNTER — Other Ambulatory Visit: Payer: Self-pay | Admitting: Family Medicine

## 2023-04-05 DIAGNOSIS — M549 Dorsalgia, unspecified: Secondary | ICD-10-CM

## 2023-04-05 DIAGNOSIS — M7501 Adhesive capsulitis of right shoulder: Secondary | ICD-10-CM

## 2023-04-05 NOTE — Telephone Encounter (Signed)
Requested medication (s) are due for refill today:   Yes  Requested medication (s) are on the active medication list:   Yes  Future visit scheduled:   No    Seen 2 mo. Ago by Dr. Alvis Lemmings   Last ordered: 07/28/2022 #60, 2 refills   Returned because there is not a protocol assigned to this medication  Requested Prescriptions  Pending Prescriptions Disp Refills   sacubitril-valsartan (ENTRESTO) 49-51 MG 60 tablet 2    Sig: Take 1 tablet by mouth 2 (two) times daily.     Off-Protocol Failed - 04/05/2023  1:00 PM      Failed - Medication not assigned to a protocol, review manually.      Passed - Valid encounter within last 12 months    Recent Outpatient Visits           2 months ago Obstructive sleep apnea   [place] [facility] [place], [place], [place]   4 months ago Chronic systolic heart failure [facility])   [place] [facility] [place], Odette Horns, [place]   8 months ago Chronic systolic heart failure [facility])   [place] [facility] [place], [place], [name]   1 year ago Type 2 diabetes mellitus without complication, without long-term current use of insulin [facility])   [place] [facility] [place], [name], [name]   2 years ago Type 2 diabetes mellitus with hyperglycemia, without long-term current use of insulin [facility])   [place] [facility] [place], [place], [name]

## 2023-04-05 NOTE — Telephone Encounter (Signed)
Requested medication (s) are due for refill today - yes  Requested medication (s) are on the active medication list yes  Future visit scheduled yes  Last refill: 12/02/22 #30  Notes to clinic: non delegated Rx  Requested Prescriptions  Pending Prescriptions Disp Refills   traMADol (ULTRAM) 50 MG tablet 30 tablet 0    Sig: Take 1 tablet (50 mg total) by mouth every 12 (twelve) hours as needed.     Not Delegated - Analgesics:  Opioid Agonists Failed - 04/05/2023  9:04 AM      Failed - This refill cannot be delegated      Failed - Urine Drug Screen completed in last 360 days      Failed - Valid encounter within last 3 months    Recent Outpatient Visits           2 months ago Obstructive sleep apnea   [place] [facility] [place], Odette Horns, MD   4 months ago Chronic systolic heart failure [facility])   [place] [facility] [place], [place], MD   8 months ago Chronic systolic heart failure [facility])   [place] [facility] [place], [place], [name]   1 year ago Type 2 diabetes mellitus without complication, without long-term current use of insulin [facility])   [facility] [place], [name], [name]   2 years ago Type 2 diabetes mellitus with hyperglycemia, without long-term current use of insulin [facility])   [place] [facility] [place], [place], [name]                 Requested Prescriptions  Pending Prescriptions Disp Refills   traMADol (ULTRAM) 50 MG tablet 30 tablet 0    Sig: Take 1 tablet (50 mg total) by mouth every 12 (twelve) hours as needed.     Not Delegated - Analgesics:  Opioid Agonists Failed - 04/05/2023  9:04 AM      Failed - This refill cannot be delegated      Failed - Urine Drug Screen completed in last 360 days      Failed - Valid encounter within last 3 months    Recent Outpatient Visits           2 months ago Obstructive sleep apnea    [place] [facility] [place], Odette Horns, MD   4 months ago Chronic systolic heart failure [facility])   [facility] [place], Odette Horns, MD   8 months ago Chronic systolic heart failure [facility])   [facility] [place], [place], [name]   1 year ago Type 2 diabetes mellitus without complication, without long-term current use of insulin [facility])   [place] [facility] [place], [place], [name]   2 years ago Type 2 diabetes mellitus with hyperglycemia, without long-term current use of insulin [facility])   [place] [facility] [place], [place], [name]

## 2023-04-07 ENCOUNTER — Other Ambulatory Visit (HOSPITAL_COMMUNITY): Payer: Self-pay

## 2023-04-07 MED ORDER — ENTRESTO 49-51 MG PO TABS
1.0000 | ORAL_TABLET | Freq: Two times a day (BID) | ORAL | 2 refills | Status: DC
Start: 2023-04-07 — End: 2023-10-22
  Filled 2023-04-07: qty 60, 30d supply, fill #0
  Filled 2023-05-25: qty 60, 30d supply, fill #1
  Filled 2023-06-21: qty 60, 30d supply, fill #2

## 2023-04-07 MED ORDER — TRAMADOL HCL 50 MG PO TABS
50.0000 mg | ORAL_TABLET | Freq: Two times a day (BID) | ORAL | 0 refills | Status: DC | PRN
Start: 2023-04-07 — End: 2023-11-03
  Filled 2023-04-07: qty 30, 15d supply, fill #0

## 2023-04-14 DIAGNOSIS — G4733 Obstructive sleep apnea (adult) (pediatric): Secondary | ICD-10-CM | POA: Diagnosis not present

## 2023-05-01 ENCOUNTER — Other Ambulatory Visit: Payer: Self-pay | Admitting: Physician Assistant

## 2023-05-01 ENCOUNTER — Other Ambulatory Visit: Payer: Self-pay | Admitting: Family Medicine

## 2023-05-01 DIAGNOSIS — I5022 Chronic systolic (congestive) heart failure: Secondary | ICD-10-CM

## 2023-05-01 DIAGNOSIS — K219 Gastro-esophageal reflux disease without esophagitis: Secondary | ICD-10-CM

## 2023-05-01 DIAGNOSIS — E1169 Type 2 diabetes mellitus with other specified complication: Secondary | ICD-10-CM

## 2023-05-03 ENCOUNTER — Other Ambulatory Visit: Payer: Self-pay

## 2023-05-03 ENCOUNTER — Other Ambulatory Visit (HOSPITAL_COMMUNITY): Payer: Self-pay

## 2023-05-03 MED ORDER — DAPAGLIFLOZIN PROPANEDIOL 10 MG PO TABS
10.0000 mg | ORAL_TABLET | Freq: Every day | ORAL | 0 refills | Status: DC
Start: 2023-05-03 — End: 2024-05-10
  Filled 2023-05-03 – 2023-06-21 (×2): qty 90, 90d supply, fill #0

## 2023-05-03 MED ORDER — OMEPRAZOLE 20 MG PO CPDR
20.0000 mg | DELAYED_RELEASE_CAPSULE | Freq: Every day | ORAL | 0 refills | Status: DC
Start: 2023-05-03 — End: 2023-09-30
  Filled 2023-05-03: qty 90, 90d supply, fill #0

## 2023-05-03 MED ORDER — CARVEDILOL 6.25 MG PO TABS
6.2500 mg | ORAL_TABLET | Freq: Two times a day (BID) | ORAL | 0 refills | Status: DC
Start: 2023-05-03 — End: 2023-09-30
  Filled 2023-05-03: qty 180, 90d supply, fill #0

## 2023-05-04 ENCOUNTER — Other Ambulatory Visit (HOSPITAL_COMMUNITY): Payer: Self-pay

## 2023-05-05 DIAGNOSIS — G4733 Obstructive sleep apnea (adult) (pediatric): Secondary | ICD-10-CM | POA: Diagnosis not present

## 2023-05-25 ENCOUNTER — Other Ambulatory Visit: Payer: Self-pay | Admitting: Physician Assistant

## 2023-05-25 DIAGNOSIS — E1165 Type 2 diabetes mellitus with hyperglycemia: Secondary | ICD-10-CM

## 2023-05-26 ENCOUNTER — Other Ambulatory Visit: Payer: Self-pay

## 2023-05-26 MED ORDER — METFORMIN HCL 500 MG PO TABS
500.0000 mg | ORAL_TABLET | Freq: Every day | ORAL | 1 refills | Status: DC
Start: 2023-05-26 — End: 2023-11-03
  Filled 2023-05-26: qty 90, 90d supply, fill #0
  Filled 2023-09-30: qty 90, 90d supply, fill #1

## 2023-05-27 ENCOUNTER — Telehealth: Payer: Self-pay | Admitting: *Deleted

## 2023-05-27 NOTE — Telephone Encounter (Signed)
Patient was called and advised that she had lab work pending for DM. Advised to return to have labs so she could continue to have medication refills.   Patient was given office hours. Verbalized understanding.

## 2023-06-05 DIAGNOSIS — G4733 Obstructive sleep apnea (adult) (pediatric): Secondary | ICD-10-CM | POA: Diagnosis not present

## 2023-06-08 DIAGNOSIS — G4733 Obstructive sleep apnea (adult) (pediatric): Secondary | ICD-10-CM | POA: Diagnosis not present

## 2023-06-20 ENCOUNTER — Other Ambulatory Visit: Payer: Self-pay | Admitting: Family Medicine

## 2023-06-20 DIAGNOSIS — F411 Generalized anxiety disorder: Secondary | ICD-10-CM

## 2023-06-21 ENCOUNTER — Other Ambulatory Visit: Payer: Self-pay

## 2023-06-21 ENCOUNTER — Other Ambulatory Visit (HOSPITAL_COMMUNITY): Payer: Self-pay

## 2023-06-21 MED ORDER — BUSPIRONE HCL 10 MG PO TABS
10.0000 mg | ORAL_TABLET | Freq: Three times a day (TID) | ORAL | 1 refills | Status: DC
Start: 2023-06-21 — End: 2023-11-03
  Filled 2023-06-21: qty 90, 30d supply, fill #0
  Filled 2023-07-27: qty 90, 30d supply, fill #1

## 2023-06-21 NOTE — Telephone Encounter (Signed)
Requested Prescriptions  Pending Prescriptions Disp Refills   busPIRone (BUSPAR) 10 MG tablet 90 tablet 0    Sig: Take 1 tablet (10 mg total) by mouth 3 (three) times daily. (To treat anxiety).     Psychiatry: Anxiolytics/Hypnotics - Non-controlled Passed - 06/20/2023  4:24 PM      Passed - Valid encounter within last 12 months    Recent Outpatient Visits           4 months ago Obstructive sleep apnea   [name], [name], MD   6 months ago Chronic systolic heart failure [facility])   [name], MD   10 months ago Chronic systolic heart failure [facility])   [name], [name], [name]   1 year ago Type 2 diabetes mellitus without complication, without long-term current use of insulin [facility])   [name], [name], [name]   2 years ago Type 2 diabetes mellitus with hyperglycemia, without long-term current use of insulin [facility])   [name], [name], [name]

## 2023-06-22 ENCOUNTER — Other Ambulatory Visit (HOSPITAL_COMMUNITY): Payer: Self-pay

## 2023-06-29 ENCOUNTER — Other Ambulatory Visit: Payer: Self-pay | Admitting: Family Medicine

## 2023-06-29 DIAGNOSIS — I5022 Chronic systolic (congestive) heart failure: Secondary | ICD-10-CM

## 2023-07-01 ENCOUNTER — Other Ambulatory Visit: Payer: Self-pay

## 2023-07-01 MED ORDER — FUROSEMIDE 20 MG PO TABS
20.0000 mg | ORAL_TABLET | Freq: Every day | ORAL | 0 refills | Status: DC
Start: 2023-07-01 — End: 2023-09-30
  Filled 2023-07-01: qty 30, 30d supply, fill #0

## 2023-07-05 DIAGNOSIS — G4733 Obstructive sleep apnea (adult) (pediatric): Secondary | ICD-10-CM | POA: Diagnosis not present

## 2023-07-13 DIAGNOSIS — G4733 Obstructive sleep apnea (adult) (pediatric): Secondary | ICD-10-CM | POA: Diagnosis not present

## 2023-07-14 ENCOUNTER — Other Ambulatory Visit: Payer: Self-pay | Admitting: Family Medicine

## 2023-07-14 DIAGNOSIS — I5022 Chronic systolic (congestive) heart failure: Secondary | ICD-10-CM

## 2023-07-14 DIAGNOSIS — E1169 Type 2 diabetes mellitus with other specified complication: Secondary | ICD-10-CM

## 2023-07-15 ENCOUNTER — Other Ambulatory Visit: Payer: Self-pay

## 2023-07-15 ENCOUNTER — Other Ambulatory Visit (HOSPITAL_COMMUNITY): Payer: Self-pay

## 2023-07-15 DIAGNOSIS — G4733 Obstructive sleep apnea (adult) (pediatric): Secondary | ICD-10-CM | POA: Diagnosis not present

## 2023-07-15 MED ORDER — ATORVASTATIN CALCIUM 40 MG PO TABS
40.0000 mg | ORAL_TABLET | Freq: Every day | ORAL | 1 refills | Status: DC
Start: 2023-07-15 — End: 2023-11-03
  Filled 2023-07-15: qty 90, 90d supply, fill #0
  Filled 2023-10-19: qty 90, 90d supply, fill #1

## 2023-07-15 MED ORDER — POTASSIUM CHLORIDE ER 10 MEQ PO TBCR
10.0000 meq | EXTENDED_RELEASE_TABLET | Freq: Every day | ORAL | 3 refills | Status: DC
  Filled 2023-07-15: qty 30, 30d supply, fill #0
  Filled 2023-09-30: qty 30, 30d supply, fill #1
  Filled 2023-10-26 (×2): qty 30, 30d supply, fill #2

## 2023-07-27 ENCOUNTER — Other Ambulatory Visit: Payer: Self-pay | Admitting: Family Medicine

## 2023-07-27 ENCOUNTER — Other Ambulatory Visit: Payer: Self-pay

## 2023-07-27 ENCOUNTER — Other Ambulatory Visit (HOSPITAL_COMMUNITY): Payer: Self-pay

## 2023-07-27 DIAGNOSIS — K219 Gastro-esophageal reflux disease without esophagitis: Secondary | ICD-10-CM

## 2023-07-27 DIAGNOSIS — I5022 Chronic systolic (congestive) heart failure: Secondary | ICD-10-CM

## 2023-07-28 ENCOUNTER — Other Ambulatory Visit (HOSPITAL_COMMUNITY): Payer: Self-pay

## 2023-08-05 DIAGNOSIS — G4733 Obstructive sleep apnea (adult) (pediatric): Secondary | ICD-10-CM | POA: Diagnosis not present

## 2023-08-11 DIAGNOSIS — K219 Gastro-esophageal reflux disease without esophagitis: Secondary | ICD-10-CM | POA: Diagnosis not present

## 2023-08-11 DIAGNOSIS — G8929 Other chronic pain: Secondary | ICD-10-CM | POA: Diagnosis not present

## 2023-08-11 DIAGNOSIS — E119 Type 2 diabetes mellitus without complications: Secondary | ICD-10-CM | POA: Diagnosis not present

## 2023-08-11 DIAGNOSIS — G4733 Obstructive sleep apnea (adult) (pediatric): Secondary | ICD-10-CM | POA: Diagnosis not present

## 2023-08-11 DIAGNOSIS — F411 Generalized anxiety disorder: Secondary | ICD-10-CM | POA: Diagnosis not present

## 2023-08-11 DIAGNOSIS — Z809 Family history of malignant neoplasm, unspecified: Secondary | ICD-10-CM | POA: Diagnosis not present

## 2023-08-11 DIAGNOSIS — I110 Hypertensive heart disease with heart failure: Secondary | ICD-10-CM | POA: Diagnosis not present

## 2023-08-11 DIAGNOSIS — F325 Major depressive disorder, single episode, in full remission: Secondary | ICD-10-CM | POA: Diagnosis not present

## 2023-08-11 DIAGNOSIS — M545 Low back pain, unspecified: Secondary | ICD-10-CM | POA: Diagnosis not present

## 2023-08-11 DIAGNOSIS — E785 Hyperlipidemia, unspecified: Secondary | ICD-10-CM | POA: Diagnosis not present

## 2023-08-11 DIAGNOSIS — Z8249 Family history of ischemic heart disease and other diseases of the circulatory system: Secondary | ICD-10-CM | POA: Diagnosis not present

## 2023-08-11 DIAGNOSIS — I509 Heart failure, unspecified: Secondary | ICD-10-CM | POA: Diagnosis not present

## 2023-08-25 ENCOUNTER — Encounter: Payer: Self-pay | Admitting: Family Medicine

## 2023-08-25 NOTE — Telephone Encounter (Signed)
 Care team updated and letter sent for eye exam notes.

## 2023-09-04 DIAGNOSIS — G4733 Obstructive sleep apnea (adult) (pediatric): Secondary | ICD-10-CM | POA: Diagnosis not present

## 2023-09-08 DIAGNOSIS — G4733 Obstructive sleep apnea (adult) (pediatric): Secondary | ICD-10-CM | POA: Diagnosis not present

## 2023-09-30 ENCOUNTER — Other Ambulatory Visit: Payer: Self-pay | Admitting: Family Medicine

## 2023-09-30 ENCOUNTER — Other Ambulatory Visit: Payer: Self-pay

## 2023-09-30 DIAGNOSIS — I5022 Chronic systolic (congestive) heart failure: Secondary | ICD-10-CM

## 2023-09-30 DIAGNOSIS — K219 Gastro-esophageal reflux disease without esophagitis: Secondary | ICD-10-CM

## 2023-09-30 MED ORDER — FUROSEMIDE 20 MG PO TABS
20.0000 mg | ORAL_TABLET | Freq: Every day | ORAL | 0 refills | Status: DC
  Filled 2023-09-30: qty 30, 30d supply, fill #0

## 2023-09-30 MED ORDER — CARVEDILOL 6.25 MG PO TABS
6.2500 mg | ORAL_TABLET | Freq: Two times a day (BID) | ORAL | 0 refills | Status: DC
Start: 2023-09-30 — End: 2023-10-26
  Filled 2023-09-30: qty 60, 30d supply, fill #0

## 2023-09-30 MED ORDER — OMEPRAZOLE 20 MG PO CPDR
20.0000 mg | DELAYED_RELEASE_CAPSULE | Freq: Every day | ORAL | 0 refills | Status: DC
Start: 2023-09-30 — End: 2023-11-03
  Filled 2023-09-30: qty 90, 90d supply, fill #0

## 2023-09-30 NOTE — Telephone Encounter (Signed)
Needs appointment

## 2023-09-30 NOTE — Telephone Encounter (Signed)
Requested Prescriptions  Pending Prescriptions Disp Refills   omeprazole (PRILOSEC) 20 MG capsule 90 capsule 0    Sig: Take 1 capsule (20 mg) by mouth daily.     Gastroenterology: Proton Pump Inhibitors Passed - 09/30/2023  3:59 PM      Passed - Valid encounter within last 12 months    Recent Outpatient Visits           8 months ago Obstructive sleep apnea   [facility] [place]. [facility], MD   10 months ago Chronic systolic heart failure [facility])   [facility] [facility] [place]. [facility] Hoy Register, MD   1 year ago Chronic systolic heart failure [facility])   [facility] Of [place]. [facility] [place], [place], [name]   2 years ago Type 2 diabetes mellitus without complication, without long-term current use of insulin (HCC)   [place] [facility] - [facility] Dept Of [place]. [facility], [name], [name]   2 years ago Type 2 diabetes mellitus with hyperglycemia, without long-term current use of insulin (HCC)   [place] [place]. [facility] [place], [place], [name]

## 2023-10-05 DIAGNOSIS — G4733 Obstructive sleep apnea (adult) (pediatric): Secondary | ICD-10-CM | POA: Diagnosis not present

## 2023-10-13 DIAGNOSIS — G4733 Obstructive sleep apnea (adult) (pediatric): Secondary | ICD-10-CM | POA: Diagnosis not present

## 2023-10-17 ENCOUNTER — Other Ambulatory Visit: Payer: Self-pay | Admitting: Family Medicine

## 2023-10-17 DIAGNOSIS — I5022 Chronic systolic (congestive) heart failure: Secondary | ICD-10-CM

## 2023-10-18 NOTE — Telephone Encounter (Signed)
 Requested medication (s) are due for refill today: Yes  Requested medication (s) are on the active medication list: Yes  Last refill:  04/07/23  Future visit scheduled: Yes  Notes to clinic:  Unable to refill due to no refill protocol for this medication.      Requested Prescriptions  Pending Prescriptions Disp Refills   sacubitril -valsartan  (ENTRESTO ) 49-51 MG 60 tablet 2    Sig: Take 1 tablet by mouth 2 (two) times daily.     Off-Protocol Failed - 10/18/2023  1:16 PM      Failed - Medication not assigned to a protocol, review manually.      Passed - Valid encounter within last 12 months    Recent Outpatient Visits           8 months ago Obstructive sleep apnea   [facility] [facility] [place]. [facility] Joaquin Mulberry, MD   10 months ago Chronic systolic heart failure [facility])   [place] [facility] [facility] - [facility] Dept Of [place]. [facility] Joaquin Mulberry, MD   1 year ago Chronic systolic heart failure [facility])   [place] [facility] - [facility] Dept Of [place]. [facility] [place], [place], [name]   2 years ago Type 2 diabetes mellitus without complication, without long-term current use of insulin (HCC)   [place] [facility] - [facility] Dept Of [place]. [facility], [name], [name]   3 years ago Type 2 diabetes mellitus with hyperglycemia, without long-term current use of insulin (HCC)   [place] [facility] Dept Of [place]. [facility] [place], [place], PA-C

## 2023-10-19 ENCOUNTER — Other Ambulatory Visit (HOSPITAL_COMMUNITY): Payer: Self-pay

## 2023-10-19 ENCOUNTER — Other Ambulatory Visit: Payer: Self-pay | Admitting: Family Medicine

## 2023-10-19 DIAGNOSIS — I5022 Chronic systolic (congestive) heart failure: Secondary | ICD-10-CM

## 2023-10-19 NOTE — Telephone Encounter (Signed)
Requested medication (s) are due for refill today: Yes  Requested medication (s) are on the active medication list: Yes  Last refill:  06/21/23  Future visit scheduled: No  Notes to clinic:  Unable to refill per protocol, medication not assigned to the refill protocol.      Requested Prescriptions  Pending Prescriptions Disp Refills   sacubitril-valsartan (ENTRESTO) 49-51 MG 60 tablet 2    Sig: Take 1 tablet by mouth 2 (two) times daily.     Off-Protocol Failed - 10/19/2023  4:21 PM      Failed - Medication not assigned to a protocol, review manually.      Passed - Valid encounter within last 12 months    Recent Outpatient Visits           8 months ago Obstructive sleep apnea   [place] [place]. [facility] Hoy Register, MD   10 months ago Chronic systolic heart failure [facility])   [place] - [facility] Dept Of [place]. [facility] Hoy Register, MD   1 year ago Chronic systolic heart failure [facility])   [place] [facility] - [facility] Dept Of [place]. [facility] [place], [place], [name]   2 years ago Type 2 diabetes mellitus without complication, without long-term current use of insulin (HCC)   [place] [facility] - A Dept Of [place]. [facility], [name], [name]   3 years ago Type 2 diabetes mellitus with hyperglycemia, without long-term current use of insulin (HCC)   [place] [facility] Dept Of [place]. [facility] [place], [place], [name]

## 2023-10-22 ENCOUNTER — Telehealth: Payer: Self-pay

## 2023-10-22 ENCOUNTER — Other Ambulatory Visit (HOSPITAL_COMMUNITY): Payer: Self-pay

## 2023-10-22 ENCOUNTER — Other Ambulatory Visit: Payer: Self-pay | Admitting: Family Medicine

## 2023-10-22 DIAGNOSIS — I5022 Chronic systolic (congestive) heart failure: Secondary | ICD-10-CM

## 2023-10-22 NOTE — Telephone Encounter (Signed)
Patient has been called and given appointment in office.    Copied from CRM [id number]. Topic: Clinical - Medication Question >> Oct 22, 2023  8:53 AM Patsy Lager T wrote: Reason for CRM: patient called to f/u on the script for sacubitril-valsartan (ENTRESTO) 49-51 MG. Advised the refill was not done as she needs an appt. Patient said she had a video call with Dr Alvis Lemmings but I was not able to find that appt. She is asking for someone to call her back as she needs this medication.

## 2023-10-22 NOTE — Telephone Encounter (Signed)
Called patient to schedule appt for medication refills. Appt scheduled for 11/03/23. Patient has been out of medication x 1 week

## 2023-10-22 NOTE — Telephone Encounter (Signed)
Requested medication (s) are due for refill today: yes  Requested medication (s) are on the active medication list: yes  Last refill:  04/07/23 #60 2 refills  Future visit scheduled: yes  11/03/23  Notes to clinic:  medication not assigned to a protocol. Patient out of medication x 1 week. Do you want to refill Rx? [facility] if can be filled today and notify patient for pick up .     Requested Prescriptions  Pending Prescriptions Disp Refills   sacubitril-valsartan (ENTRESTO) 49-51 MG 60 tablet 2    Sig: Take 1 tablet by mouth 2 (two) times daily.     Off-Protocol Failed - 10/22/2023  3:06 PM      Failed - Medication not assigned to a protocol, review manually.      Passed - Valid encounter within last 12 months    Recent Outpatient Visits           9 months ago Obstructive sleep apnea   [facility] [place]. [facility] Hoy Register, MD   10 months ago Chronic systolic heart failure [facility])   [facility] [facility] Dept Of [place]. [facility] Hoy Register, MD   1 year ago Chronic systolic heart failure [facility])   [place] [facility] - A Dept Of [place]. [facility] [place], [place], [name]   2 years ago Type 2 diabetes mellitus without complication, without long-term current use of insulin (HCC)   [place] [facility] - A Dept Of [place]. [facility], [name], [name]   3 years ago Type 2 diabetes mellitus with hyperglycemia, without long-term current use of insulin (HCC)   [place] [facility] Dept Of [place]. [facility] [place], [place], [name]

## 2023-10-23 ENCOUNTER — Other Ambulatory Visit: Payer: Self-pay | Admitting: Family Medicine

## 2023-10-23 DIAGNOSIS — I5022 Chronic systolic (congestive) heart failure: Secondary | ICD-10-CM

## 2023-10-25 ENCOUNTER — Other Ambulatory Visit (HOSPITAL_COMMUNITY): Payer: Self-pay

## 2023-10-25 ENCOUNTER — Other Ambulatory Visit: Payer: Self-pay

## 2023-10-25 MED ORDER — SACUBITRIL-VALSARTAN 49-51 MG PO TABS
1.0000 | ORAL_TABLET | Freq: Two times a day (BID) | ORAL | 0 refills | Status: DC
  Filled 2023-10-25 – 2023-10-26 (×2): qty 60, 30d supply, fill #0

## 2023-10-26 ENCOUNTER — Other Ambulatory Visit: Payer: Self-pay | Admitting: Physician Assistant

## 2023-10-26 ENCOUNTER — Other Ambulatory Visit (HOSPITAL_COMMUNITY): Payer: Self-pay

## 2023-10-26 ENCOUNTER — Other Ambulatory Visit: Payer: Self-pay | Admitting: Family Medicine

## 2023-10-26 ENCOUNTER — Other Ambulatory Visit: Payer: Self-pay

## 2023-10-26 DIAGNOSIS — I5022 Chronic systolic (congestive) heart failure: Secondary | ICD-10-CM

## 2023-10-26 MED ORDER — SACUBITRIL-VALSARTAN 49-51 MG PO TABS
1.0000 | ORAL_TABLET | Freq: Two times a day (BID) | ORAL | 0 refills | Status: DC
  Filled 2023-10-26: qty 60, 30d supply, fill #0

## 2023-10-26 NOTE — Telephone Encounter (Signed)
Copied from CRM [id number]. Topic: Clinical - Medication Refill >> Oct 26, 2023 10:56 AM Patsy Lager T wrote: Most Recent Primary Care Visit:  Provider: Hoy Register  Department[facility]  Visit Type: TELEPHONE OFFICE VISIT  Date: 01/25/2023  Medication:  sacubitril-valsartan (ENTRESTO) 49-51 MG   Has the patient contacted their pharmacy? No  Is this the correct pharmacy for this prescription? Yes  This is the patient's preferred pharmacy:   [facility] [address] [place] [address] Phone: [phone] Fax: [phone]  Has the prescription been filled recently? Yes  Is the patient out of the medication? Yes  Has the patient been seen for an appointment in the last year OR does the patient have an upcoming appointment? Yes  Can we respond through MyChart? Yes  Agent: Please be advised that Rx refills may take up to 3 business days. We ask that you follow-up with your pharmacy.  Patient needs enough medication to get her through to her next appt on 2/26

## 2023-10-26 NOTE — Telephone Encounter (Signed)
Change on pharmacy. Requested Prescriptions  Pending Prescriptions Disp Refills   sacubitril-valsartan (ENTRESTO) 49-51 MG 60 tablet 0    Sig: Take 1 tablet by mouth 2 (two) times daily.     Off-Protocol Failed - 10/26/2023  2:40 PM      Failed - Medication not assigned to a protocol, review manually.      Passed - Valid encounter within last 12 months    Recent Outpatient Visits           9 months ago Obstructive sleep apnea   [facility] Of [place]. [facility] Hoy Register, MD   10 months ago Chronic systolic heart failure [facility])   [place] [facility] Dept Of [place]. [facility] Hoy Register, MD   1 year ago Chronic systolic heart failure [facility])   [place] [facility] - [facility] Dept Of [place]. [facility] [place], [place], [name]   2 years ago Type 2 diabetes mellitus without complication, without long-term current use of insulin (HCC)   [place] [facility] - A Dept Of [place]. [facility], [name], [name]   3 years ago Type 2 diabetes mellitus with hyperglycemia, without long-term current use of insulin (HCC)   [place] [facility] [place] [facility] Of [place]. [facility], [place], [name]

## 2023-10-27 ENCOUNTER — Other Ambulatory Visit (HOSPITAL_COMMUNITY): Payer: Self-pay

## 2023-10-27 ENCOUNTER — Other Ambulatory Visit: Payer: Self-pay

## 2023-10-27 MED ORDER — FUROSEMIDE 20 MG PO TABS
20.0000 mg | ORAL_TABLET | Freq: Every day | ORAL | 0 refills | Status: DC
  Filled 2023-10-27: qty 30, 30d supply, fill #0

## 2023-10-27 MED ORDER — CARVEDILOL 6.25 MG PO TABS
6.2500 mg | ORAL_TABLET | Freq: Two times a day (BID) | ORAL | 0 refills | Status: DC
  Filled 2023-10-27: qty 60, 30d supply, fill #0

## 2023-11-03 ENCOUNTER — Other Ambulatory Visit: Payer: Self-pay

## 2023-11-03 ENCOUNTER — Ambulatory Visit: Payer: Medicare HMO | Attending: Physician Assistant | Admitting: Physician Assistant

## 2023-11-03 ENCOUNTER — Encounter: Payer: Self-pay | Admitting: Physician Assistant

## 2023-11-03 ENCOUNTER — Other Ambulatory Visit (HOSPITAL_COMMUNITY): Payer: Self-pay

## 2023-11-03 VITALS — BP 143/91 | HR 112 | Temp 98.3°F | Ht 66.0 in | Wt 176.0 lb

## 2023-11-03 DIAGNOSIS — M549 Dorsalgia, unspecified: Secondary | ICD-10-CM

## 2023-11-03 DIAGNOSIS — M7501 Adhesive capsulitis of right shoulder: Secondary | ICD-10-CM | POA: Diagnosis not present

## 2023-11-03 DIAGNOSIS — E785 Hyperlipidemia, unspecified: Secondary | ICD-10-CM | POA: Diagnosis not present

## 2023-11-03 DIAGNOSIS — F411 Generalized anxiety disorder: Secondary | ICD-10-CM | POA: Diagnosis not present

## 2023-11-03 DIAGNOSIS — I5022 Chronic systolic (congestive) heart failure: Secondary | ICD-10-CM | POA: Diagnosis not present

## 2023-11-03 DIAGNOSIS — Z7984 Long term (current) use of oral hypoglycemic drugs: Secondary | ICD-10-CM | POA: Diagnosis not present

## 2023-11-03 DIAGNOSIS — R7303 Prediabetes: Secondary | ICD-10-CM

## 2023-11-03 DIAGNOSIS — K219 Gastro-esophageal reflux disease without esophagitis: Secondary | ICD-10-CM | POA: Diagnosis not present

## 2023-11-03 DIAGNOSIS — E1165 Type 2 diabetes mellitus with hyperglycemia: Secondary | ICD-10-CM | POA: Diagnosis not present

## 2023-11-03 DIAGNOSIS — E559 Vitamin D deficiency, unspecified: Secondary | ICD-10-CM

## 2023-11-03 LAB — GLUCOSE, POCT (MANUAL RESULT ENTRY): POC Glucose: 124 mg/dL — AB (ref 70–99)

## 2023-11-03 MED ORDER — BUSPIRONE HCL 10 MG PO TABS
10.0000 mg | ORAL_TABLET | Freq: Three times a day (TID) | ORAL | 1 refills | Status: AC
  Filled 2023-11-03: qty 90, 30d supply, fill #0
  Filled 2023-12-02: qty 90, 30d supply, fill #1

## 2023-11-03 MED ORDER — POTASSIUM CHLORIDE ER 10 MEQ PO TBCR
10.0000 meq | EXTENDED_RELEASE_TABLET | Freq: Every day | ORAL | 1 refills | Status: DC
  Filled 2023-11-03 – 2023-12-02 (×2): qty 90, 90d supply, fill #0
  Filled 2024-02-26: qty 90, 90d supply, fill #1

## 2023-11-03 MED ORDER — FUROSEMIDE 20 MG PO TABS
20.0000 mg | ORAL_TABLET | Freq: Every day | ORAL | 1 refills | Status: DC
  Filled 2023-11-03 – 2023-12-02 (×2): qty 90, 90d supply, fill #0
  Filled 2024-02-26: qty 90, 90d supply, fill #1

## 2023-11-03 MED ORDER — TRAMADOL HCL 50 MG PO TABS: 50.0000 mg | ORAL_TABLET | Freq: Two times a day (BID) | ORAL | 0 refills | Status: DC | PRN

## 2023-11-03 MED ORDER — CARVEDILOL 6.25 MG PO TABS
6.2500 mg | ORAL_TABLET | Freq: Two times a day (BID) | ORAL | 0 refills | Status: DC
  Filled 2023-11-03 – 2023-12-02 (×2): qty 60, 30d supply, fill #0

## 2023-11-03 MED ORDER — OMEPRAZOLE 20 MG PO CPDR
20.0000 mg | DELAYED_RELEASE_CAPSULE | Freq: Every day | ORAL | 0 refills | Status: DC
  Filled 2023-11-03 – 2024-01-20 (×4): qty 90, 90d supply, fill #0

## 2023-11-03 MED ORDER — ATORVASTATIN CALCIUM 40 MG PO TABS
40.0000 mg | ORAL_TABLET | Freq: Every day | ORAL | 1 refills | Status: DC
  Filled 2023-11-03 – 2024-01-20 (×3): qty 90, 90d supply, fill #0
  Filled 2024-04-11: qty 90, 90d supply, fill #1

## 2023-11-03 MED ORDER — SACUBITRIL-VALSARTAN 49-51 MG PO TABS
1.0000 | ORAL_TABLET | Freq: Two times a day (BID) | ORAL | 0 refills | Status: DC
  Filled 2023-11-03 – 2023-12-02 (×2): qty 60, 30d supply, fill #0

## 2023-11-03 MED ORDER — TRAMADOL HCL 50 MG PO TABS
50.0000 mg | ORAL_TABLET | Freq: Two times a day (BID) | ORAL | 0 refills | Status: DC | PRN
  Filled 2023-11-03: qty 30, 15d supply, fill #0

## 2023-11-03 MED ORDER — METFORMIN HCL 500 MG PO TABS
500.0000 mg | ORAL_TABLET | Freq: Every day | ORAL | 1 refills | Status: DC
  Filled 2023-11-03: qty 90, 90d supply, fill #0

## 2023-11-03 NOTE — Patient Instructions (Signed)
 Check your blood pressures daily and record.  Bring these to your next visit.  Sit still and quiet for 5 mins and practice deep breathing prior to checking your blood pressure.

## 2023-11-03 NOTE — Progress Notes (Signed)
 Patient ID: Sabrina Ortiz, female   DOB: 1971-01-29, 53 y.o.   MRN: [id number]   Sabrina Ortiz, is a 53 y.o. female  WUJ:[id number]  [id number]  DOB - 11/29/1970  Chief Complaint  Patient presents with   Diabetes    DM & HTN f/u. Med refill.  No questions / concerns       Subjective:   Sabrina Ortiz is a 53 y.o. female here today for med RF.  She stopped farxiga about 1 year ago when her A1C was 5.9.  has not been checked again since then.  Out of some of her BP meds.  Denies CP/SOB/dizziness/HA.  Does have increase in anxiety and has not been taking buspar.  She was only taking it once daily, but it did help.  "Xanax worked better."  She is stressed out by her daughter and grandkids at times.       11/03/2023    3:02 PM 01/20/2023   10:01 PM 08/06/2022    3:42 PM  Depression screen PHQ 2/9  Decreased Interest 1 0 1  Down, Depressed, Hopeless 1 0 1  PHQ - 2 Score 2 0 2  Altered sleeping 3  2  Tired, decreased energy 1  1  Change in appetite 1  1  Feeling bad or failure about yourself  0  0  Trouble concentrating 1  1  Moving slowly or fidgety/restless 0  0  Suicidal thoughts 0  0  PHQ-9 Score 8  7  Difficult doing work/chores Somewhat difficult       No problems updated.  ALLERGIES: Allergies  Allergen Reactions   Shellfish Allergy Anaphylaxis    PAST MEDICAL HISTORY: Past Medical History:  Diagnosis Date   Angio-edema    Anxiety    Chronic back pain    COPD (chronic obstructive pulmonary disease) (HCC)    Depression    Hypertension    Mental disorder    PTSD (post-traumatic stress disorder)    Urticaria     MEDICATIONS AT HOME: Prior to Admission medications   Medication Sig Start Date End Date Taking? Authorizing Provider  aspirin EC 81 MG EC tablet Take 1 tablet (81 mg total) by mouth daily. 09/19/18  Yes Orpah Cobb, MD  Blood Glucose Monitoring Suppl (ONETOUCH VERIO) w/Device KIT 1 Units by Does not apply route in the morning and at  bedtime. 05/01/20  Yes Mayers, [name], PA-C  dapagliflozin propanediol (FARXIGA) 10 MG TABS tablet Take 1 tablet (10 mg total) by mouth daily before breakfast. Please make appt with Dr. Alvis Lemmings for more refills. 05/03/23  Yes Newlin, Odette Horns, MD  glucose blood (ONETOUCH VERIO) test strip Use to check blood sugar twice daily. 02/15/23  Yes Hoy Register, MD  Lancets (ONETOUCH DELICA PLUS LANCET33G) MISC Use to check blood sugar twice daily. 02/15/23  Yes Hoy Register, MD  Misc. Devices MISC CPAP, 11 cm of water.  Diagnosis obstructive sleep apnea. 01/18/23  Yes Hoy Register, MD  Vitamin D, Ergocalciferol, (DRISDOL) 1.25 MG (50000 UNIT) CAPS capsule Take 1 capsule (50,000 Units total) by mouth every 7 (seven) days. 08/07/22  Yes Jimy Gates M, PA-C  atorvastatin (LIPITOR) 40 MG tablet Take 1 tablet (40 mg total) by mouth daily at 6pm or later after evening meal 11/03/23   Georgian Co M, PA-C  busPIRone (BUSPAR) 10 MG tablet Take 1 tablet (10 mg total) by mouth 3 (three) times daily. (To treat anxiety). 11/03/23   Anders Simmonds, PA-C  carvedilol (COREG) 6.25 MG  tablet Take 1 tablet (6.25 mg) by mouth 2 times daily with a meal. 11/03/23   Hikaru Delorenzo, Marzella Schlein, PA-C  furosemide (LASIX) 20 MG tablet Take 1 tablet (20 mg total) by mouth daily. Must have office visit for refills 11/03/23   Anders Simmonds, PA-C  metFORMIN (GLUCOPHAGE) 500 MG tablet Take 1 tablet (500 mg total) by mouth daily with breakfast. 11/03/23   Anders Simmonds, PA-C  omeprazole (PRILOSEC) 20 MG capsule Take 1 capsule (20 mg) by mouth daily. 11/03/23   Anders Simmonds, PA-C  potassium chloride (KLOR-CON) 10 MEQ tablet Take 1 tablet (10 mEq) by mouth daily. 11/03/23   Anders Simmonds, PA-C  sacubitril-valsartan (ENTRESTO) 49-51 MG Take 1 tablet by mouth 2 (two) times daily. 11/03/23   Anders Simmonds, PA-C  traMADol (ULTRAM) 50 MG tablet Take 1 tablet (50 mg total) by mouth every 12 (twelve) hours as needed. 11/03/23   Anders Simmonds, PA-C  traMADol (ULTRAM) 50 MG tablet Take 1 tablet (50 mg total) by mouth every 12 (twelve) hours as needed. 11/03/23   Anders Simmonds, PA-C  potassium chloride (K-DUR,KLOR-CON) 10 MEQ tablet Take 1 tablet (10 mEq total) by mouth daily. 09/19/18 02/22/19  Fulp, Cammie, MD    ROS: Neg HEENT Neg resp Neg cardiac Neg GI Neg GU Neg MS Neg neuro  Objective:   Vitals:   11/03/23 1455 11/03/23 1542  BP: (!) 143/98 (!) 143/91  Pulse: (!) 112   Temp: 98.3 F (36.8 C)   TempSrc: Oral   SpO2: 97%   Weight: 176 lb (79.8 kg)   Height: 5\' 6"  (1.676 m)    Exam General appearance : Awake, alert, not in any distress. Speech Clear. Not toxic looking HEENT: Atraumatic and Normocephalic Neck: Supple, no JVD. No cervical lymphadenopathy.  Chest: Good air entry bilaterally, CTAB.  No rales/rhonchi/wheezing CVS: S1 S2 regular, no murmurs.  Extremities: B/L Lower Ext shows no edema, both legs are warm to touch Neurology: Awake alert, and oriented X 3, CN II-XII intact, Non focal Skin: No Rash  Data Review Lab Results  Component Value Date   HGBA1C 5.8 (A) 07/28/2022   HGBA1C 5.8 07/28/2022   HGBA1C 5.8 07/28/2022   HGBA1C 5.8 07/28/2022    Assessment & Plan   1. Prediabetes (Primary) Will assess whether or not she needs to be on med.  - Glucose (CBG) - CMP14+EGFR - Hemoglobin A1c  2. Dyslipidemia resume - atorvastatin (LIPITOR) 40 MG tablet; Take 1 tablet (40 mg total) by mouth daily at 6pm or later after evening meal  Dispense: 90 tablet; Refill: 1  3. Chronic systolic heart failure (HCC) RF meds.  Make appt with cardiology too.   - furosemide (LASIX) 20 MG tablet; Take 1 tablet (20 mg total) by mouth daily. Must have office visit for refills  Dispense: 90 tablet; Refill: 1 - potassium chloride (KLOR-CON) 10 MEQ tablet; Take 1 tablet (10 mEq) by mouth daily.  Dispense: 90 tablet; Refill: 1 - sacubitril-valsartan (ENTRESTO) 49-51 MG; Take 1 tablet by mouth 2 (two)  times daily.  Dispense: 60 tablet; Refill: 0 - carvedilol (COREG) 6.25 MG tablet; Take 1 tablet (6.25 mg) by mouth 2 times daily with a meal.  Dispense: 60 tablet; Refill: 0 - CMP14+EGFR  4. Type 2 diabetes mellitus with hyperglycemia, without long-term current use of insulin (HCC) - metFORMIN (GLUCOPHAGE) 500 MG tablet; Take 1 tablet (500 mg total) by mouth daily with breakfast.  Dispense: 90 tablet; Refill: 1 -  Hemoglobin A1c  5. GAD (generalized anxiety disorder) - busPIRone (BUSPAR) 10 MG tablet; Take 1 tablet (10 mg total) by mouth 3 (three) times daily. (To treat anxiety).  Dispense: 90 tablet; Refill: 1  6. Adhesive capsulitis of right shoulder She was prescribed this previously - traMADol (ULTRAM) 50 MG tablet; Take 1 tablet (50 mg total) by mouth every 12 (twelve) hours as needed.  Dispense: 30 tablet; Refill: 0 Use sparingly.  Patient verbalized understanding  7. Musculoskeletal back pain - traMADol (ULTRAM) 50 MG tablet; Take 1 tablet (50 mg total) by mouth every 12 (twelve) hours as needed.  Dispense: 30 tablet; Refill: 0  8. Gastroesophageal reflux disease without esophagitis stable - omeprazole (PRILOSEC) 20 MG capsule; Take 1 capsule (20 mg) by mouth daily.  Dispense: 90 capsule; Refill: 0  9. Vitamin D deficiency - Vitamin D, 25-hydroxy    Return in about 4 months (around 03/02/2024) for PCP for chronic conditions.  The patient was given clear instructions to go to ER or return to medical center if symptoms don't improve, worsen or new problems develop. The patient verbalized understanding. The patient was told to call to get lab results if they haven't heard anything in the next week.      Georgian Co, PA-C [facility] [place], [place] [phone]   11/03/2023, 4:24 PM

## 2023-11-04 ENCOUNTER — Other Ambulatory Visit: Payer: Self-pay | Admitting: Physician Assistant

## 2023-11-04 ENCOUNTER — Encounter: Payer: Self-pay | Admitting: Physician Assistant

## 2023-11-04 ENCOUNTER — Other Ambulatory Visit: Payer: Self-pay

## 2023-11-04 DIAGNOSIS — Z7984 Long term (current) use of oral hypoglycemic drugs: Secondary | ICD-10-CM

## 2023-11-04 DIAGNOSIS — E559 Vitamin D deficiency, unspecified: Secondary | ICD-10-CM

## 2023-11-04 DIAGNOSIS — E1165 Type 2 diabetes mellitus with hyperglycemia: Secondary | ICD-10-CM

## 2023-11-04 LAB — CMP14+EGFR
ALT: 39 [IU]/L — ABNORMAL HIGH (ref 0–32)
AST: 41 [IU]/L — ABNORMAL HIGH (ref 0–40)
Albumin: 4.4 g/dL (ref 3.8–4.9)
Alkaline Phosphatase: 71 [IU]/L (ref 44–121)
BUN/Creatinine Ratio: 14 (ref 9–23)
BUN: 13 mg/dL (ref 6–24)
Bilirubin Total: 0.4 mg/dL (ref 0.0–1.2)
CO2: 20 mmol/L (ref 20–29)
Calcium: 9.3 mg/dL (ref 8.7–10.2)
Chloride: 100 mmol/L (ref 96–106)
Creatinine, Ser: 0.9 mg/dL (ref 0.57–1.00)
Globulin, Total: 3 g/dL (ref 1.5–4.5)
Glucose: 125 mg/dL — ABNORMAL HIGH (ref 70–99)
Potassium: 4.2 mmol/L (ref 3.5–5.2)
Sodium: 139 mmol/L (ref 134–144)
Total Protein: 7.4 g/dL (ref 6.0–8.5)
eGFR: 77 mL/min/{1.73_m2} (ref >59)

## 2023-11-04 LAB — VITAMIN D 25 HYDROXY (VIT D DEFICIENCY, FRACTURES): Vit D, 25-Hydroxy: 9.8 ng/mL — ABNORMAL LOW (ref 30.0–100.0)

## 2023-11-04 LAB — HEMOGLOBIN A1C
Est. average glucose Bld gHb Est-mCnc: 160 mg/dL
Hgb A1c MFr Bld: 7.2 % — ABNORMAL HIGH (ref 4.8–5.6)

## 2023-11-04 MED ORDER — METFORMIN HCL 500 MG PO TABS
1000.0000 mg | ORAL_TABLET | Freq: Two times a day (BID) | ORAL | 1 refills | Status: DC
  Filled 2023-11-04 – 2023-12-02 (×3): qty 180, 45d supply, fill #0
  Filled 2024-01-20: qty 180, 45d supply, fill #1

## 2023-11-04 MED ORDER — VITAMIN D (ERGOCALCIFEROL) 1.25 MG (50000 UNIT) PO CAPS
50000.0000 [IU] | ORAL_CAPSULE | ORAL | 0 refills | Status: DC
  Filled 2023-11-04: qty 16, 112d supply, fill #0
  Filled 2023-11-09: qty 14, 98d supply, fill #0
  Filled 2023-12-02: qty 12, 84d supply, fill #0
  Filled 2023-12-03: qty 4, 28d supply, fill #0
  Filled 2023-12-03: qty 16, 112d supply, fill #0
  Filled 2023-12-26 – 2024-01-20 (×2): qty 4, 28d supply, fill #1
  Filled 2024-02-13: qty 4, 28d supply, fill #2
  Filled 2024-04-11: qty 4, 28d supply, fill #3

## 2023-11-05 ENCOUNTER — Telehealth: Payer: Self-pay

## 2023-11-05 ENCOUNTER — Other Ambulatory Visit (HOSPITAL_COMMUNITY): Payer: Self-pay

## 2023-11-05 DIAGNOSIS — G4733 Obstructive sleep apnea (adult) (pediatric): Secondary | ICD-10-CM | POA: Diagnosis not present

## 2023-11-05 NOTE — Telephone Encounter (Signed)
-----   Message from Georgian Co sent at 11/04/2023  9:52 AM EST ----- Your  vitamin D is  VERY low.  This can contribute to muscle aches, anxiety, fatigue, and depression.  I have sent a prescription to the pharmacy you them to take once a week.  Diabetes has worsened and A1C went from 5.8 to 7.2.  I am increasing your metformin from 1 tab daily to 2 tabs twice daily.  Increase water intake.  Liver function is slightly abnormal.  Avoid drinking alcohol or taking products with tylenol.  We will recheck these level in 3-4 months.  Thanks, Georgian Co, PA-C

## 2023-11-05 NOTE — Telephone Encounter (Signed)
 Pt was called and is aware of results, DOB was confirmed.  ?

## 2023-11-10 ENCOUNTER — Other Ambulatory Visit (HOSPITAL_COMMUNITY): Payer: Self-pay

## 2023-11-10 ENCOUNTER — Other Ambulatory Visit: Payer: Self-pay

## 2023-11-11 ENCOUNTER — Other Ambulatory Visit (HOSPITAL_COMMUNITY): Payer: Self-pay

## 2023-11-11 ENCOUNTER — Other Ambulatory Visit: Payer: Self-pay

## 2023-11-15 ENCOUNTER — Other Ambulatory Visit: Payer: Self-pay

## 2023-12-02 ENCOUNTER — Other Ambulatory Visit (HOSPITAL_COMMUNITY): Payer: Self-pay

## 2023-12-02 ENCOUNTER — Telehealth: Payer: Self-pay | Admitting: Family Medicine

## 2023-12-02 ENCOUNTER — Other Ambulatory Visit: Payer: Self-pay

## 2023-12-02 NOTE — Telephone Encounter (Signed)
 Copied from CRM [id number]. Topic: General - Other >> Dec 02, 2023  3:42 PM Abundio Miu S wrote: Reason for CRM: Patient requesting information on Continuous glucose monitor.

## 2023-12-03 ENCOUNTER — Other Ambulatory Visit: Payer: Self-pay

## 2023-12-03 DIAGNOSIS — G4733 Obstructive sleep apnea (adult) (pediatric): Secondary | ICD-10-CM | POA: Diagnosis not present

## 2023-12-03 NOTE — Telephone Encounter (Signed)
 Patient was called and informed that her CGM may have to come from a DME company and I will submit the order and keep her informed.

## 2023-12-26 ENCOUNTER — Other Ambulatory Visit: Payer: Self-pay | Admitting: Physician Assistant

## 2023-12-26 DIAGNOSIS — I5022 Chronic systolic (congestive) heart failure: Secondary | ICD-10-CM

## 2023-12-27 ENCOUNTER — Other Ambulatory Visit: Payer: Self-pay

## 2023-12-27 ENCOUNTER — Other Ambulatory Visit (HOSPITAL_COMMUNITY): Payer: Self-pay

## 2023-12-27 MED ORDER — CARVEDILOL 6.25 MG PO TABS
6.2500 mg | ORAL_TABLET | Freq: Two times a day (BID) | ORAL | 0 refills | Status: DC
  Filled 2023-12-27 – 2024-01-20 (×2): qty 60, 30d supply, fill #0

## 2023-12-28 ENCOUNTER — Other Ambulatory Visit: Payer: Self-pay

## 2023-12-28 ENCOUNTER — Encounter: Payer: Self-pay | Admitting: Pharmacist

## 2023-12-31 ENCOUNTER — Other Ambulatory Visit: Payer: Self-pay

## 2024-01-10 DIAGNOSIS — G4733 Obstructive sleep apnea (adult) (pediatric): Secondary | ICD-10-CM | POA: Diagnosis not present

## 2024-01-20 ENCOUNTER — Other Ambulatory Visit: Payer: Self-pay

## 2024-01-20 ENCOUNTER — Encounter: Payer: Self-pay | Admitting: Pharmacist

## 2024-01-20 ENCOUNTER — Other Ambulatory Visit (HOSPITAL_COMMUNITY): Payer: Self-pay

## 2024-01-25 ENCOUNTER — Ambulatory Visit: Payer: Medicare HMO | Attending: Family Medicine

## 2024-01-25 VITALS — Ht 66.0 in | Wt 176.0 lb

## 2024-01-25 DIAGNOSIS — Z0000 Encounter for general adult medical examination without abnormal findings: Secondary | ICD-10-CM

## 2024-01-25 DIAGNOSIS — Z1231 Encounter for screening mammogram for malignant neoplasm of breast: Secondary | ICD-10-CM

## 2024-01-25 NOTE — Patient Instructions (Addendum)
 Ms. Gause , Thank you for taking time out of your busy schedule to complete your Annual Wellness Visit with me. I enjoyed our conversation and look forward to speaking with you again next year. I, as well as your care team,  appreciate your ongoing commitment to your health goals. Please review the following plan we discussed and let me know if I can assist you in the future. Your Game plan/ To Do List    Referrals: If you haven't heard from the office you've been referred to, please reach out to them at the phone provided.  [facility] [address], [place], [place] [address] [phone]  Follow up Visits: Next Medicare AWV with our clinical staff: 01/30/2025 at 1:30 pm PHONE VISIT with Nurse   Have you seen your provider in the last 6 months (3 months if uncontrolled diabetes)? Yes Next Office Visit with your provider: 03/02/2024 at 2:30 pm OFFICE VISIT with Dr. Lincoln Renshaw  Clinician Recommendations:  Aim for 30 minutes of exercise or brisk walking, 6-8 glasses of water, and 5 servings of fruits and vegetables each day.       This is a list of the screening recommended for you and due dates:  Health Maintenance  Topic Date Due   COVID-19 Vaccine (1) Never done   Complete foot exam   Never done   Eye exam for diabetics  Never done   DTaP/Tdap/Td vaccine (1 - Tdap) Never done   Pneumococcal Vaccination (1 of 2 - PCV) Never done   Zoster (Shingles) Vaccine (1 of 2) Never done   Colon Cancer Screening  Never done   Mammogram  03/05/2021   Yearly kidney health urinalysis for diabetes  08/07/2023   Flu Shot  04/07/2024   Hemoglobin A1C  05/02/2024   Yearly kidney function blood test for diabetes  11/02/2024   Medicare Annual Wellness Visit  01/24/2025   Pap with HPV screening  06/04/2025   Hepatitis C Screening  Completed   HIV Screening  Completed   HPV Vaccine  Aged Out   Meningitis B Vaccine  Aged Out    Advanced directives: (Declined) Advance  directive discussed with you today. Even though you declined this today, please call our office should you change your mind, and we can give you the proper paperwork for you to fill out. Advance Care Planning is important because it:  [x]  Makes sure you receive the medical care that is consistent with your values, goals, and preferences  [x]  It provides guidance to your family and loved ones and reduces their decisional burden about whether or not they are making the right decisions based on your wishes.  Follow the link provided in your after visit summary or read over the paperwork we have mailed to you to help you started getting your Advance Directives in place. If you need assistance in completing these, please reach out to us  so that we can help you!  See attachments for Preventive Care and Fall Prevention Tips.

## 2024-01-25 NOTE — Progress Notes (Signed)
 Because this visit was a virtual/telehealth visit,  certain criteria was not obtained, such a blood pressure, CBG if applicable, and timed get up and go. Any medications not marked as "taking" were not mentioned during the medication reconciliation part of the visit. Any vitals not documented were not able to be obtained due to this being a telehealth visit or patient was unable to self-report a recent blood pressure reading due to a lack of equipment at home via telehealth. Vitals that have been documented are verbally provided by the patient.   Subjective:   Sabrina Ortiz is a 53 y.o. who presents for a Medicare Wellness preventive visit.  As a reminder, Annual Wellness Visits don't include a physical exam, and some assessments may be limited, especially if this visit is performed virtually. We may recommend an in-person follow-up visit with your provider if needed.  Visit Complete: Virtual I connected with  Sabrina Ortiz on 01/25/24 by a audio enabled telemedicine application and verified that I am speaking with the correct person using two identifiers.  Patient Location: Home  Provider Location: Office/Clinic  I discussed the limitations of evaluation and management by telemedicine. The patient expressed understanding and agreed to proceed.  Vital Signs: Because this visit was a virtual/telehealth visit, some criteria may be missing or patient reported. Any vitals not documented were not able to be obtained and vitals that have been documented are patient reported.  VideoDeclined- This patient declined Librarian, academic. Therefore the visit was completed with audio only.  Persons Participating in Visit: Patient.  AWV Questionnaire: No: Patient Medicare AWV questionnaire was not completed prior to this visit.  Cardiac Risk Factors include: advanced age (>40men, >33 women);diabetes mellitus;dyslipidemia;hypertension;sedentary lifestyle      Objective:     Today's Vitals   01/25/24 1337  Weight: 176 lb (79.8 kg)  Height: 5\' 6"  (1.676 m)  PainSc: 0-No pain   Body mass index is 28.41 kg/m.     01/25/2024    1:40 PM 01/20/2023   10:03 PM 09/16/2018   10:16 PM 09/14/2018    8:23 PM 04/30/2018    8:29 PM 04/11/2018   11:00 AM 02/27/2018    8:49 PM  Advanced Directives  Does Patient Have a Medical Advance Directive? No No  No No No No  Does patient want to make changes to medical advance directive?     No - Patient declined    Would patient like information on creating a medical advance directive? No - Patient declined Yes (MAU/Ambulatory/Procedural Areas - Information given) No - Patient declined  No - Patient declined No - Patient declined No - Patient declined    Current Medications (verified) Outpatient Encounter Medications as of 01/25/2024  Medication Sig   aspirin  EC 81 MG EC tablet Take 1 tablet (81 mg total) by mouth daily.   atorvastatin  (LIPITOR) 40 MG tablet Take 1 tablet (40 mg total) by mouth daily at 6pm or later after evening meal   Blood Glucose Monitoring Suppl (ONETOUCH VERIO) w/Device KIT 1 Units by Does not apply route in the morning and at bedtime. (Patient not taking: Reported on 01/25/2024)   busPIRone  (BUSPAR ) 10 MG tablet Take 1 tablet (10 mg total) by mouth 3 (three) times daily. (To treat anxiety).   carvedilol  (COREG ) 6.25 MG tablet Take 1 tablet (6.25 mg) by mouth 2 times daily with a meal.   dapagliflozin  propanediol (FARXIGA ) 10 MG TABS tablet Take 1 tablet (10 mg total) by mouth  daily before breakfast. Please make appt with Dr. Newlin for more refills.   furosemide  (LASIX ) 20 MG tablet Take 1 tablet (20 mg total) by mouth daily. Must have office visit for refills   glucose blood (ONETOUCH VERIO) test strip Use to check blood sugar twice daily. (Patient not taking: Reported on 01/25/2024)   Lancets (ONETOUCH DELICA PLUS LANCET33G) MISC Use to check blood sugar twice daily. (Patient not taking:  Reported on 01/25/2024)   metFORMIN  (GLUCOPHAGE ) 500 MG tablet Take 2 tablets (1,000 mg total) by mouth 2 (two) times daily with a meal.   Misc. Devices MISC CPAP, 11 cm of water.  Diagnosis obstructive sleep apnea.   omeprazole  (PRILOSEC) 20 MG capsule Take 1 capsule (20 mg) by mouth daily.   potassium chloride  (KLOR-CON ) 10 MEQ tablet Take 1 tablet (10 mEq) by mouth daily.   sacubitril -valsartan  (ENTRESTO ) 49-51 MG Take 1 tablet by mouth 2 (two) times daily.   traMADol  (ULTRAM ) 50 MG tablet Take 1 tablet (50 mg total) by mouth every 12 (twelve) hours as needed.   traMADol  (ULTRAM ) 50 MG tablet Take 1 tablet (50 mg total) by mouth every 12 (twelve) hours as needed.   Vitamin D , Ergocalciferol , (DRISDOL ) 1.25 MG (50000 UNIT) CAPS capsule Take 1 capsule (50,000 Units total) by mouth every 7 (seven) days.   [DISCONTINUED] potassium chloride  (K-DUR,KLOR-CON ) 10 MEQ tablet Take 1 tablet (10 mEq total) by mouth daily.   No facility-administered encounter medications on file as of 01/25/2024.    Allergies (verified) Shellfish allergy    History: Past Medical History:  Diagnosis Date   Angio-edema    Anxiety    Chronic back pain    COPD (chronic obstructive pulmonary disease) (HCC)    Depression    Hypertension    Mental disorder    PTSD (post-traumatic stress disorder)    Urticaria    Past Surgical History:  Procedure Laterality Date   CYST REMOVAL HAND     RIGHT/LEFT HEART CATH AND CORONARY ANGIOGRAPHY N/A 04/12/2018   Procedure: RIGHT/LEFT HEART CATH AND CORONARY ANGIOGRAPHY;  Surgeon: Pasqual Bone, MD;  Location: [facility];  Service: Cardiovascular;  Laterality: N/A;   Family History  Problem Relation Age of Onset   Breast cancer Mother    Breast cancer Maternal Aunt    Breast cancer Maternal Grandmother    Social History   Socioeconomic History   Marital status: Single    Spouse name: Not on file   Number of children: Not on file   Years of education: Not on file    Highest education level: Not on file  Occupational History   Not on file  Tobacco Use   Smoking status: Never   Smokeless tobacco: Never  Vaping Use   Vaping status: Never Used  Substance and Sexual Activity   Alcohol use: No   Drug use: No   Sexual activity: Not Currently  Other Topics Concern   Not on file  Social History Narrative   Not on file   Social Drivers of Health   Financial Resource Strain: Low Risk  (01/25/2024)   Overall Financial Resource Strain (CARDIA)    Difficulty of Paying Living Expenses: Not hard at all  Food Insecurity: Food Insecurity Present (01/25/2024)   Hunger Vital Sign    Worried About Running Out of Food in the Last Year: Sometimes true    Ran Out of Food in the Last Year: Sometimes true  Transportation Needs: No Transportation Needs (01/25/2024)   PRAPARE -  Administrator, Civil Service (Medical): No    Lack of Transportation (Non-Medical): No  Physical Activity: Inactive (11/03/2023)   Exercise Vital Sign    Days of Exercise per Week: 0 days    Minutes of Exercise per Session: 0 min  Stress: No Stress Concern Present (01/25/2024)   [employer] of Occupational Health - Occupational Stress Questionnaire    Feeling of Stress : Only a little  Recent Concern: Stress - Stress Concern Present (11/03/2023)   [employer] of Occupational Health - Occupational Stress Questionnaire    Feeling of Stress : Rather much  Social Connections: Moderately Isolated (01/25/2024)   Social Connection and Isolation Panel [NHANES]    Frequency of Communication with Friends and Family: Twice a week    Frequency of Social Gatherings with Friends and Family: Once a week    Attends Religious Services: Never    Database administrator or Organizations: Yes    Attends Banker Meetings: Never    Marital Status: Never married    Tobacco Counseling Counseling given: Not Answered    Clinical Intake:  Pre-visit preparation completed:  Yes  Pain : No/denies pain Pain Score: 0-No pain     BMI - recorded: 28.41 Nutritional Status: BMI 25 -29 Overweight Nutritional Risks: None Diabetes: Yes CBG done?: No Did pt. bring in CBG monitor from home?: No  Lab Results  Component Value Date   HGBA1C 7.2 (H) 11/03/2023   HGBA1C 5.8 (A) 07/28/2022   HGBA1C 5.8 07/28/2022   HGBA1C 5.8 07/28/2022   HGBA1C 5.8 07/28/2022     How often do you need to have someone help you when you read instructions, pamphlets, or other written materials from your doctor or pharmacy?: 1 - Never  Interpreter Needed?: No  Information entered by :: Lander Eslick N. Aryel Edelen, LPN.   Activities of Daily Living     01/25/2024    1:42 PM  In your present state of health, do you have any difficulty performing the following activities:  Hearing? 0  Vision? 0  Difficulty concentrating or making decisions? 1  Comment BSE: AVID READER, GAMES ON PHONE  Walking or climbing stairs? 0  Dressing or bathing? 0  Doing errands, shopping? 0  Preparing Food and eating ? N  Using the Toilet? N  In the past six months, have you accidently leaked urine? N  Do you have problems with loss of bowel control? N  Managing your Medications? N  Managing your Finances? N  Housekeeping or managing your Housekeeping? N    Patient Care Team: Lawrance Presume, MD as PCP - General (Internal Medicine) Lawrance Presume, MD as Consulting Physician (Internal Medicine) Donalynn Fry, OD as Consulting Physician (Optometry)  Indicate any recent Medical Services you may have received from other than [facility] providers in the past year (date may be approximate).     Assessment:    This is a routine wellness examination for Loreta.  Hearing/Vision screen Hearing Screening - Comments:: Denies hearing difficulties.  Vision Screening - Comments:: Wears rx glasses - up to date with routine eye exams with [facility]-Dr. Donalynn Fry    Goals Addressed                This Visit's Progress     Patient Stated (pt-stated)        01/25/2024: My goal is not to be a diabetic by choosing better eating habits.       Depression Screen  01/25/2024    1:44 PM 11/03/2023    3:02 PM 01/20/2023   10:01 PM 08/06/2022    3:42 PM 07/29/2022   10:32 AM 07/14/2021    3:00 PM 01/14/2021    2:21 PM  PHQ 2/9 Scores  PHQ - 2 Score 1 2 0 2 1 2 4   PHQ- 9 Score 5 8  7 3 10 13     Fall Risk     01/25/2024    1:41 PM 01/20/2023   10:02 PM 08/06/2022    3:42 PM 07/14/2021    3:00 PM 05/17/2019    3:16 PM  Fall Risk   Falls in the past year? 0 0 0 0 0  Number falls in past yr: 0 0 0 0   Injury with Fall? 0 0 0    Risk for fall due to : No Fall Risks No Fall Risks No Fall Risks    Follow up Falls evaluation completed Falls prevention discussed;Education provided;Falls evaluation completed  Falls evaluation completed     MEDICARE RISK AT HOME:  Medicare Risk at Home Any stairs in or around the home?: Yes If so, are there any without handrails?: No Home free of loose throw rugs in walkways, pet beds, electrical cords, etc?: Yes Adequate lighting in your home to reduce risk of falls?: Yes Life alert?: No Use of a cane, walker or w/c?: Yes (cane) Grab bars in the bathroom?: No Shower chair or bench in shower?: No Elevated toilet seat or a handicapped toilet?: Yes  TIMED UP AND GO:  Was the test performed?  No  Cognitive Function: 6CIT completed    01/25/2024    1:46 PM  MMSE - Mini Mental State Exam  Not completed: Unable to complete        01/25/2024    1:41 PM 01/20/2023   10:03 PM  6CIT Screen  What Year? 0 points 0 points  What month? 0 points 0 points  What time? 0 points 0 points  Count back from 20 0 points 0 points  Months in reverse 0 points 0 points  Repeat phrase 0 points 0 points  Total Score 0 points 0 points    Immunizations  There is no immunization history on file for this patient.  Screening Tests Health Maintenance  Topic Date  Due   COVID-19 Vaccine (1) Never done   FOOT EXAM  Never done   OPHTHALMOLOGY EXAM  Never done   DTaP/Tdap/Td (1 - Tdap) Never done   Pneumococcal Vaccine 65-34 Years old (1 of 2 - PCV) Never done   Zoster Vaccines- Shingrix (1 of 2) Never done   Colonoscopy  Never done   MAMMOGRAM  03/05/2021   Diabetic kidney evaluation - Urine ACR  08/07/2023   INFLUENZA VACCINE  04/07/2024   HEMOGLOBIN A1C  05/02/2024   Diabetic kidney evaluation - eGFR measurement  11/02/2024   Medicare Annual Wellness (AWV)  01/24/2025   Cervical Cancer Screening (HPV/Pap Cotest)  06/04/2025   Hepatitis C Screening  Completed   HIV Screening  Completed   HPV VACCINES  Aged Out   Meningococcal B Vaccine  Aged Out    Health Maintenance  Health Maintenance Due  Topic Date Due   COVID-19 Vaccine (1) Never done   FOOT EXAM  Never done   OPHTHALMOLOGY EXAM  Never done   DTaP/Tdap/Td (1 - Tdap) Never done   Pneumococcal Vaccine 54-72 Years old (1 of 2 - PCV) Never done   Zoster Vaccines- Shingrix (  1 of 2) Never done   Colonoscopy  Never done   MAMMOGRAM  03/05/2021   Diabetic kidney evaluation - Urine ACR  08/07/2023   Health Maintenance Items Addressed: YES Mammogram ordered  Additional Screening:  Vision Screening: Recommended annual ophthalmology exams for early detection of glaucoma and other disorders of the eye.  Dental Screening: Recommended annual dental exams for proper oral hygiene  Community Resource Referral / Chronic Care Management: CRR required this visit?  No   CCM required this visit?  No   Plan:    I have personally reviewed and noted the following in the patient's chart:   Medical and social history Use of alcohol, tobacco or illicit drugs  Current medications and supplements including opioid prescriptions. Patient is not currently taking opioid prescriptions. Functional ability and status Nutritional status Physical activity Advanced directives List of other  physicians Hospitalizations, surgeries, and ER visits in previous 12 months Vitals Screenings to include cognitive, depression, and falls Referrals and appointments  In addition, I have reviewed and discussed with patient certain preventive protocols, quality metrics, and best practice recommendations. A written personalized care plan for preventive services as well as general preventive health recommendations were provided to patient.   Margette Sheldon, LPN   [id number]   After Visit Summary: (MyChart) Due to this being a telephonic visit, the after visit summary with patients personalized plan was offered to patient via MyChart   Nurse Notes: Patient aware of current care gaps.  This nurse ordered Mammogram for this patient.  Patient declined vaccinations and Colonoscopy.  Patient stated that she has the Cologuard Kit and will send in sample.  This nurse advised patient if Cologuard kits results are positive then she will have to be referred to Gastroenterology for screening.

## 2024-01-27 ENCOUNTER — Telehealth (INDEPENDENT_AMBULATORY_CARE_PROVIDER_SITE_OTHER): Payer: Self-pay | Admitting: Family Medicine

## 2024-01-27 NOTE — Telephone Encounter (Signed)
 Late entry! Call pt to confirm her appt  6/26 with Dr.Johnson. Pt wants to est care with Dr.johnson. She no longer wants to est care with Dr. Newlin

## 2024-02-13 ENCOUNTER — Other Ambulatory Visit: Payer: Self-pay | Admitting: Family Medicine

## 2024-02-13 DIAGNOSIS — I5022 Chronic systolic (congestive) heart failure: Secondary | ICD-10-CM

## 2024-02-14 ENCOUNTER — Other Ambulatory Visit: Payer: Self-pay

## 2024-02-14 MED ORDER — CARVEDILOL 6.25 MG PO TABS
6.2500 mg | ORAL_TABLET | Freq: Two times a day (BID) | ORAL | 0 refills | Status: DC
  Filled 2024-02-14: qty 60, 30d supply, fill #0

## 2024-02-17 ENCOUNTER — Ambulatory Visit

## 2024-02-26 ENCOUNTER — Other Ambulatory Visit (HOSPITAL_COMMUNITY): Payer: Self-pay

## 2024-03-02 ENCOUNTER — Ambulatory Visit: Payer: Medicare HMO | Admitting: Physician Assistant

## 2024-03-02 ENCOUNTER — Ambulatory Visit: Admitting: Internal Medicine

## 2024-04-10 ENCOUNTER — Telehealth: Payer: Self-pay | Admitting: Family Medicine

## 2024-04-10 NOTE — Telephone Encounter (Signed)
 Called patient, no answer. Left voicemail confirming upcoming appointment on 04/11/2024 at 11:10 am. Provided callback number for any questions or changes.

## 2024-04-11 ENCOUNTER — Other Ambulatory Visit: Payer: Self-pay | Admitting: Physician Assistant

## 2024-04-11 ENCOUNTER — Other Ambulatory Visit: Payer: Self-pay | Admitting: Family Medicine

## 2024-04-11 ENCOUNTER — Other Ambulatory Visit (HOSPITAL_COMMUNITY): Payer: Self-pay

## 2024-04-11 ENCOUNTER — Other Ambulatory Visit: Payer: Self-pay

## 2024-04-11 ENCOUNTER — Ambulatory Visit: Admitting: Family Medicine

## 2024-04-11 DIAGNOSIS — G4733 Obstructive sleep apnea (adult) (pediatric): Secondary | ICD-10-CM | POA: Diagnosis not present

## 2024-04-11 DIAGNOSIS — E1165 Type 2 diabetes mellitus with hyperglycemia: Secondary | ICD-10-CM

## 2024-04-11 DIAGNOSIS — I5022 Chronic systolic (congestive) heart failure: Secondary | ICD-10-CM

## 2024-04-11 DIAGNOSIS — Z7984 Long term (current) use of oral hypoglycemic drugs: Secondary | ICD-10-CM

## 2024-04-12 ENCOUNTER — Other Ambulatory Visit: Payer: Self-pay

## 2024-04-12 ENCOUNTER — Other Ambulatory Visit (HOSPITAL_COMMUNITY): Payer: Self-pay

## 2024-04-12 MED ORDER — FUROSEMIDE 20 MG PO TABS
20.0000 mg | ORAL_TABLET | Freq: Every day | ORAL | 0 refills | Status: DC
  Filled 2024-04-12: qty 90, 90d supply, fill #0

## 2024-04-12 MED ORDER — CARVEDILOL 6.25 MG PO TABS
6.2500 mg | ORAL_TABLET | Freq: Two times a day (BID) | ORAL | 0 refills | Status: DC
  Filled 2024-04-12: qty 60, 30d supply, fill #0

## 2024-04-12 MED ORDER — METFORMIN HCL 500 MG PO TABS
1000.0000 mg | ORAL_TABLET | Freq: Two times a day (BID) | ORAL | 0 refills | Status: DC
  Filled 2024-04-12: qty 360, 90d supply, fill #0

## 2024-05-10 ENCOUNTER — Other Ambulatory Visit: Payer: Self-pay | Admitting: Family Medicine

## 2024-05-10 ENCOUNTER — Other Ambulatory Visit: Payer: Self-pay | Admitting: Physician Assistant

## 2024-05-10 ENCOUNTER — Other Ambulatory Visit: Payer: Self-pay | Admitting: Internal Medicine

## 2024-05-10 DIAGNOSIS — I5022 Chronic systolic (congestive) heart failure: Secondary | ICD-10-CM

## 2024-05-10 DIAGNOSIS — K219 Gastro-esophageal reflux disease without esophagitis: Secondary | ICD-10-CM

## 2024-05-10 DIAGNOSIS — E1169 Type 2 diabetes mellitus with other specified complication: Secondary | ICD-10-CM

## 2024-05-10 DIAGNOSIS — E559 Vitamin D deficiency, unspecified: Secondary | ICD-10-CM

## 2024-05-11 ENCOUNTER — Other Ambulatory Visit: Payer: Self-pay

## 2024-05-11 ENCOUNTER — Other Ambulatory Visit (HOSPITAL_COMMUNITY): Payer: Self-pay

## 2024-05-11 MED ORDER — VITAMIN D (ERGOCALCIFEROL) 1.25 MG (50000 UNIT) PO CAPS
50000.0000 [IU] | ORAL_CAPSULE | ORAL | 0 refills | Status: DC
  Filled 2024-05-11: qty 4, 28d supply, fill #0

## 2024-05-11 MED ORDER — CARVEDILOL 6.25 MG PO TABS
6.2500 mg | ORAL_TABLET | Freq: Two times a day (BID) | ORAL | 0 refills | Status: DC
  Filled 2024-05-11: qty 60, 30d supply, fill #0

## 2024-05-11 MED ORDER — DAPAGLIFLOZIN PROPANEDIOL 10 MG PO TABS
10.0000 mg | ORAL_TABLET | Freq: Every day | ORAL | 0 refills | Status: DC
  Filled 2024-05-11: qty 30, 30d supply, fill #0

## 2024-05-11 MED ORDER — OMEPRAZOLE 20 MG PO CPDR
20.0000 mg | DELAYED_RELEASE_CAPSULE | Freq: Every day | ORAL | 0 refills | Status: DC
  Filled 2024-05-11: qty 30, 30d supply, fill #0

## 2024-05-11 NOTE — Telephone Encounter (Signed)
 Requested medication (s) are due for refill today - yes  Requested medication (s) are on the active medication list -yes  Future visit scheduled -yes  Last refill: 05/03/23 #90  Notes to clinic: last RF has notes- must keep appointment for RF- sent for review of request  Requested Prescriptions  Pending Prescriptions Disp Refills   dapagliflozin  propanediol (FARXIGA ) 10 MG TABS tablet 90 tablet 0    Sig: Take 1 tablet (10 mg total) by mouth daily before breakfast. Please make appt with Dr. Newlin for more refills.     Endocrinology:  Diabetes - SGLT2 Inhibitors Failed - 05/11/2024 11:21 AM      Failed - HBA1C is between 0 and 7.9 and within 180 days    HbA1c, POC (prediabetic range)  Date Value Ref Range Status  07/28/2022 5.8 5.7 - 6.4 % Final   HbA1c, POC (controlled diabetic range)  Date Value Ref Range Status  07/28/2022 5.8 0.0 - 7.0 % Final   HbA1c POC (<> result, manual entry)  Date Value Ref Range Status  07/28/2022 5.8 4.0 - 5.6 % Final   Hgb A1c MFr Bld  Date Value Ref Range Status  11/03/2023 7.2 (H) 4.8 - 5.6 % Final    Comment:             Prediabetes: 5.7 - 6.4          Diabetes: >6.4          Glycemic control for adults with diabetes: <7.0          Passed - Cr in normal range and within 360 days    Creat  Date Value Ref Range Status  12/05/2015 1.18 (H) 0.50 - 1.10 mg/dL Final   Creatinine, Ser  Date Value Ref Range Status  11/03/2023 0.90 0.57 - 1.00 mg/dL Final         Passed - eGFR in normal range and within 360 days    GFR, Est African American  Date Value Ref Range Status  11/21/2014 86 mL/min Final   GFR calc Af Amer  Date Value Ref Range Status  05/01/2020 67 >59 mL/min/1.73 Final    Comment:    **[facility] currently reports eGFR in compliance with the current**   recommendations of the [employer]. [facility] will   update reporting as new guidelines are published from the NKF-ASN   Task force.    GFR, Est Non African  American  Date Value Ref Range Status  11/21/2014 75 mL/min Final    Comment:      The estimated GFR is a calculation valid for adults (>=49 years old) that uses the CKD-EPI algorithm to adjust for age and sex. It is   not to be used for children, pregnant women, hospitalized patients,    patients on dialysis, or with rapidly changing kidney function. According to the NKDEP, eGFR >89 is normal, 60-89 shows mild impairment, 30-59 shows moderate impairment, 15-29 shows severe impairment and <15 is ESRD.      GFR calc non Af Amer  Date Value Ref Range Status  05/01/2020 58 (L) >59 mL/min/1.73 Final   eGFR  Date Value Ref Range Status  11/03/2023 77 >59 mL/min/1.73 Final         Passed - Valid encounter within last 6 months    Recent Outpatient Visits           6 months ago Prediabetes    [facility] - [facility] Dept Of [place]. [facility]  [facility] [place], [place], [name]   1 year ago Obstructive sleep apnea   [place] [facility] [place] - [facility] Dept Of [place]. [facility] Delbert Clam, [place]   1 year ago Chronic systolic heart failure [facility])   [place] [facility] - [facility] Dept Of [place]. [facility] Delbert Clam, MD   1 year ago Chronic systolic heart failure [facility])   [place] [facility] Dept Of [place]. [facility] [place], [place], [name]   2 years ago Type 2 diabetes mellitus without complication, without long-term current use of insulin (HCC)   [place] [facility] - [facility] Dept Of [place]. [facility] [place], [name], [name]                 Requested Prescriptions  Pending Prescriptions Disp Refills   dapagliflozin  propanediol (FARXIGA ) 10 MG TABS tablet 90 tablet 0    Sig: Take 1 tablet (10 mg total) by mouth daily before breakfast. Please make appt with Dr. Newlin for more refills.     Endocrinology:  Diabetes - SGLT2 Inhibitors Failed - 05/11/2024 11:21 AM      Failed -  HBA1C is between 0 and 7.9 and within 180 days    HbA1c, POC (prediabetic range)  Date Value Ref Range Status  07/28/2022 5.8 5.7 - 6.4 % Final   HbA1c, POC (controlled diabetic range)  Date Value Ref Range Status  07/28/2022 5.8 0.0 - 7.0 % Final   HbA1c POC (<> result, manual entry)  Date Value Ref Range Status  07/28/2022 5.8 4.0 - 5.6 % Final   Hgb A1c MFr Bld  Date Value Ref Range Status  11/03/2023 7.2 (H) 4.8 - 5.6 % Final    Comment:             Prediabetes: 5.7 - 6.4          Diabetes: >6.4          Glycemic control for adults with diabetes: <7.0          Passed - Cr in normal range and within 360 days    Creat  Date Value Ref Range Status  12/05/2015 1.18 (H) 0.50 - 1.10 mg/dL Final   Creatinine, Ser  Date Value Ref Range Status  11/03/2023 0.90 0.57 - 1.00 mg/dL Final         Passed - eGFR in normal range and within 360 days    GFR, Est African American  Date Value Ref Range Status  11/21/2014 86 mL/min Final   GFR calc Af Amer  Date Value Ref Range Status  05/01/2020 67 >59 mL/min/1.73 Final    Comment:    **[facility] currently reports eGFR in compliance with the current**   recommendations of the [employer]. [facility] will   update reporting as new guidelines are published from the NKF-ASN   Task force.    GFR, Est Non African American  Date Value Ref Range Status  11/21/2014 75 mL/min Final    Comment:      The estimated GFR is a calculation valid for adults (>=21 years old) that uses the CKD-EPI algorithm to adjust for age and sex. It is   not to be used for children, pregnant women, hospitalized patients,    patients on dialysis, or with rapidly changing kidney function. According to the NKDEP, eGFR >89 is normal, 60-89 shows mild impairment, 30-59 shows moderate impairment, 15-29 shows  severe impairment and <15 is ESRD.      GFR calc non Af Amer  Date Value Ref Range Status  05/01/2020 58 (L) >59 mL/min/1.73 Final   eGFR   Date Value Ref Range Status  11/03/2023 77 >59 mL/min/1.73 Final         Passed - Valid encounter within last 6 months    Recent Outpatient Visits           6 months ago Prediabetes   [place] [facility] [place] - [facility] Dept Of [place]. [facility] [place], [place], [name]   1 year ago Obstructive sleep apnea   [place] [facility] [place] - A Dept Of [place]. [facility] Delbert Clam, MD   1 year ago Chronic systolic heart failure [facility])   [place] [facility] - A Dept Of [place]. [facility] Delbert Clam, MD   1 year ago Chronic systolic heart failure [facility])   [place] [facility] - A Dept Of [place]. [facility] [place], [place], [name]   2 years ago Type 2 diabetes mellitus without complication, without long-term current use of insulin (HCC)   [place] [facility] - A Dept Of [place]. [facility], [place], PA-C

## 2024-05-11 NOTE — Telephone Encounter (Signed)
 Requested medication (s) are due for refill today:   Yes for omeprazole ;   Provider to review vitamin D   Requested medication (s) are on the active medication list:   Yes for both   Future visit scheduled:   Yes 9/16    Last ordered: Omeprazole  11/03/2023 #90, 0 refills;   Vitamin D   (Drisdol ) 11/04/2023 #16, 0 refills  Unable to refill because non delegated    Requested Prescriptions  Pending Prescriptions Disp Refills   omeprazole  (PRILOSEC) 20 MG capsule 90 capsule 0    Sig: Take 1 capsule (20 mg) by mouth daily.     Gastroenterology: Proton Pump Inhibitors Passed - 05/11/2024 11:28 AM      Passed - Valid encounter within last 12 months    Recent Outpatient Visits           6 months ago Prediabetes   [place] [facility] [place] - [facility] Dept Of [place]. [facility] [place], [place], [name]   1 year ago Obstructive sleep apnea   [place] [facility] [place] - A Dept Of [place]. [facility] Delbert Clam, MD   1 year ago Chronic systolic heart failure [facility])   [place] [facility] - A Dept Of [place]. [facility] Delbert Clam, MD   1 year ago Chronic systolic heart failure [facility])   [place] [facility] - A Dept Of [place]. [facility] [place], [place], [name]   2 years ago Type 2 diabetes mellitus without complication, without long-term current use of insulin (HCC)   [place] [facility] - A Dept Of . [facility] [place], [name], PA-C               Vitamin D , Ergocalciferol , (DRISDOL ) 1.25 MG (50000 UNIT) CAPS capsule 16 capsule 0    Sig: Take 1 capsule (50,000 Units total) by mouth every 7 (seven) days.     Endocrinology:  Vitamins - Vitamin D  Supplementation 2 Failed - 05/11/2024 11:28 AM      Failed - Manual Review: Route requests for 50,000 IU strength to the provider      Failed - Vitamin D  in normal range and within 360 days    Vit D, 25-Hydroxy  Date Value Ref Range  Status  11/03/2023 9.8 (L) 30.0 - 100.0 ng/mL Final    Comment:    Vitamin D  deficiency has been defined by the Institute of Medicine and an Endocrine Society practice guideline as a level of serum 25-OH vitamin D  less than 20 ng/mL (1,2). The Endocrine Society went on to further define vitamin D  insufficiency as a level between 21 and 29 ng/mL (2). 1. IOM (Institute of Medicine). 2010. Dietary reference    intakes for calcium  and D. [place]: The    [employer]. 2. Holick MF, Binkley [place], Bischoff-Ferrari HA, et al.    Evaluation, treatment, and prevention of vitamin D     deficiency: an Endocrine Society clinical practice    guideline. JCEM. 2011 Jul; 96(7):1911-30.          Passed - Ca in normal range and within 360 days    Calcium   Date Value Ref Range Status  11/03/2023 9.3 8.7 - 10.2 mg/dL Final         Passed - Valid encounter within last 12 months    Recent Outpatient Visits           6 months ago Prediabetes  [place] [facility] [place] - [facility] Dept Of [place]. [facility] [place], [place], [name]   1 year ago Obstructive sleep apnea   [place] [facility] [place] - A Dept Of [place]. [facility] Delbert Clam, MD   1 year ago Chronic systolic heart failure [facility])   [place] [facility] - A Dept Of [place]. [facility] Delbert Clam, MD   1 year ago Chronic systolic heart failure [facility])   [place] [facility] - A Dept Of [place]. [facility] [place], [place], [name]   2 years ago Type 2 diabetes mellitus without complication, without long-term current use of insulin (HCC)    [facility] - A Dept Of [place]. [facility], [place], PA-C

## 2024-05-11 NOTE — Telephone Encounter (Signed)
 Appointment 05/23/24 Requested Prescriptions  Pending Prescriptions Disp Refills   carvedilol  (COREG ) 6.25 MG tablet 60 tablet 0    Sig: Take 1 tablet (6.25 mg) by mouth 2 times daily with a meal.     Cardiovascular: Beta Blockers 3 Failed - 05/11/2024 11:31 AM      Failed - AST in normal range and within 360 days    AST  Date Value Ref Range Status  11/03/2023 41 (H) 0 - 40 IU/L Final         Failed - ALT in normal range and within 360 days    ALT  Date Value Ref Range Status  11/03/2023 39 (H) 0 - 32 IU/L Final         Failed - Last BP in normal range    BP Readings from Last 1 Encounters:  11/03/23 (!) 143/91         Failed - Last Heart Rate in normal range    Pulse Readings from Last 1 Encounters:  11/03/23 (!) 112         Passed - Cr in normal range and within 360 days    Creat  Date Value Ref Range Status  12/05/2015 1.18 (H) 0.50 - 1.10 mg/dL Final   Creatinine, Ser  Date Value Ref Range Status  11/03/2023 0.90 0.57 - 1.00 mg/dL Final         Passed - Valid encounter within last 6 months    Recent Outpatient Visits           6 months ago Prediabetes   [place] [facility] [place] - A Dept Of [place]. [facility] [place], [place], [name]   1 year ago Obstructive sleep apnea   [place] [facility] [place] - A Dept Of [place]. [facility] Delbert Clam, MD   1 year ago Chronic systolic heart failure [facility])   [place] [facility] - A Dept Of [place]. [facility] Delbert Clam, MD   1 year ago Chronic systolic heart failure [facility])   [place] [facility] - A Dept Of [place]. [facility] [place], [place], [name]   2 years ago Type 2 diabetes mellitus without complication, without long-term current use of insulin (HCC)   [place] [facility] - A Dept Of [place]. [facility], [place], PA-C

## 2024-05-22 ENCOUNTER — Telehealth: Payer: Self-pay | Admitting: Internal Medicine

## 2024-05-22 NOTE — Telephone Encounter (Signed)
 LVM to confirm appt 9/16

## 2024-05-23 ENCOUNTER — Other Ambulatory Visit: Payer: Self-pay | Admitting: Internal Medicine

## 2024-05-23 ENCOUNTER — Encounter: Payer: Self-pay | Admitting: Internal Medicine

## 2024-05-23 ENCOUNTER — Other Ambulatory Visit (HOSPITAL_COMMUNITY): Payer: Self-pay

## 2024-05-23 ENCOUNTER — Other Ambulatory Visit: Payer: Self-pay

## 2024-05-23 ENCOUNTER — Ambulatory Visit: Attending: Internal Medicine | Admitting: Internal Medicine

## 2024-05-23 VITALS — BP 142/94 | HR 83 | Temp 98.3°F | Ht 66.0 in | Wt 175.0 lb

## 2024-05-23 DIAGNOSIS — Z1231 Encounter for screening mammogram for malignant neoplasm of breast: Secondary | ICD-10-CM

## 2024-05-23 DIAGNOSIS — Z2821 Immunization not carried out because of patient refusal: Secondary | ICD-10-CM

## 2024-05-23 DIAGNOSIS — K219 Gastro-esophageal reflux disease without esophagitis: Secondary | ICD-10-CM

## 2024-05-23 DIAGNOSIS — E119 Type 2 diabetes mellitus without complications: Secondary | ICD-10-CM

## 2024-05-23 DIAGNOSIS — I152 Hypertension secondary to endocrine disorders: Secondary | ICD-10-CM

## 2024-05-23 DIAGNOSIS — E785 Hyperlipidemia, unspecified: Secondary | ICD-10-CM

## 2024-05-23 DIAGNOSIS — I5022 Chronic systolic (congestive) heart failure: Secondary | ICD-10-CM | POA: Diagnosis not present

## 2024-05-23 DIAGNOSIS — N912 Amenorrhea, unspecified: Secondary | ICD-10-CM

## 2024-05-23 DIAGNOSIS — E1159 Type 2 diabetes mellitus with other circulatory complications: Secondary | ICD-10-CM | POA: Diagnosis not present

## 2024-05-23 DIAGNOSIS — Z3202 Encounter for pregnancy test, result negative: Secondary | ICD-10-CM

## 2024-05-23 DIAGNOSIS — N1831 Chronic kidney disease, stage 3a: Secondary | ICD-10-CM | POA: Insufficient documentation

## 2024-05-23 DIAGNOSIS — E559 Vitamin D deficiency, unspecified: Secondary | ICD-10-CM | POA: Diagnosis not present

## 2024-05-23 DIAGNOSIS — Z7984 Long term (current) use of oral hypoglycemic drugs: Secondary | ICD-10-CM

## 2024-05-23 DIAGNOSIS — E1169 Type 2 diabetes mellitus with other specified complication: Secondary | ICD-10-CM

## 2024-05-23 DIAGNOSIS — G4709 Other insomnia: Secondary | ICD-10-CM | POA: Diagnosis not present

## 2024-05-23 LAB — POCT GLYCOSYLATED HEMOGLOBIN (HGB A1C): HbA1c, POC (controlled diabetic range): 6.7 % (ref 0.0–7.0)

## 2024-05-23 LAB — GLUCOSE, POCT (MANUAL RESULT ENTRY): POC Glucose: 128 mg/dL — AB (ref 70–99)

## 2024-05-23 LAB — POCT URINE PREGNANCY: Preg Test, Ur: NEGATIVE

## 2024-05-23 MED ORDER — FREESTYLE LIBRE 3 PLUS SENSOR MISC
11 refills | Status: AC
  Filled 2024-05-23: qty 2, fill #0
  Filled 2024-05-23: qty 2, 30d supply, fill #0
  Filled 2024-06-24: qty 2, 30d supply, fill #1
  Filled 2024-07-21 – 2024-08-27 (×2): qty 2, 30d supply, fill #2
  Filled 2024-09-22: qty 2, 30d supply, fill #3

## 2024-05-23 MED ORDER — ATORVASTATIN CALCIUM 40 MG PO TABS
40.0000 mg | ORAL_TABLET | Freq: Every day | ORAL | 1 refills | Status: DC
  Filled 2024-05-23: qty 90, 90d supply, fill #0

## 2024-05-23 MED ORDER — OMEPRAZOLE 20 MG PO CPDR
20.0000 mg | DELAYED_RELEASE_CAPSULE | Freq: Every day | ORAL | 1 refills | Status: AC
  Filled 2024-05-23 – 2024-06-24 (×2): qty 90, 90d supply, fill #0
  Filled 2024-09-22: qty 90, 90d supply, fill #1

## 2024-05-23 MED ORDER — DAPAGLIFLOZIN PROPANEDIOL 10 MG PO TABS
10.0000 mg | ORAL_TABLET | Freq: Every day | ORAL | 1 refills | Status: DC
  Filled 2024-05-23: qty 90, 90d supply, fill #0

## 2024-05-23 MED ORDER — FUROSEMIDE 20 MG PO TABS
20.0000 mg | ORAL_TABLET | Freq: Every day | ORAL | 1 refills | Status: AC
  Filled 2024-05-23 – 2024-05-26 (×2): qty 90, 90d supply, fill #0
  Filled 2024-08-27: qty 90, 90d supply, fill #1

## 2024-05-23 MED ORDER — VITAMIN D (ERGOCALCIFEROL) 1.25 MG (50000 UNIT) PO CAPS
50000.0000 [IU] | ORAL_CAPSULE | ORAL | 4 refills | Status: AC
  Filled 2024-05-23 – 2024-06-05 (×2): qty 4, 28d supply, fill #0
  Filled 2024-08-30: qty 4, 28d supply, fill #1

## 2024-05-23 MED ORDER — SACUBITRIL-VALSARTAN 97-103 MG PO TABS
1.0000 | ORAL_TABLET | Freq: Two times a day (BID) | ORAL | 6 refills | Status: DC
  Filled 2024-05-23 (×2): qty 60, 30d supply, fill #0

## 2024-05-23 MED ORDER — CARVEDILOL 6.25 MG PO TABS
6.2500 mg | ORAL_TABLET | Freq: Two times a day (BID) | ORAL | 1 refills | Status: AC
  Filled 2024-05-23 – 2024-06-24 (×2): qty 180, 90d supply, fill #0
  Filled 2024-09-22: qty 180, 90d supply, fill #1

## 2024-05-23 MED ORDER — TRAZODONE HCL 50 MG PO TABS
25.0000 mg | ORAL_TABLET | Freq: Every evening | ORAL | 3 refills | Status: AC | PRN
  Filled 2024-05-23 (×2): qty 30, 60d supply, fill #0

## 2024-05-23 NOTE — Patient Instructions (Signed)
 VISIT SUMMARY:  Today, you had a follow-up appointment to review your diabetes, hypertension, congestive heart failure, cholesterol, sleep apnea, vitamin D  levels, and sleep issues. We discussed your current medications, lifestyle habits, and made some adjustments to your treatment plan to better manage your conditions.  YOUR PLAN:  -TYPE 2 DIABETES MELLITUS: Type 2 diabetes is a condition where your body does not use insulin properly, leading to high blood sugar levels. Your A1c is currently 6.7%, which is within the target range. Continue taking metformin  500 mg once daily. We will submit a request to your insurance for a continuous glucose monitor to help you avoid finger pricks. Aim for consistent exercise, targeting 150 minutes per week. We will also check your urine for protein to monitor your kidney health.  -CHRONIC SYSTOLIC HEART FAILURE: Chronic systolic heart failure is a condition where your heart does not pump blood as well as it should. Your blood pressure is elevated at 159/93 mmHg. We will increase your Entresto  dose to 97/103 mg twice daily. Continue following a low-salt diet and focus on weight management.  -HYPERTENSION: Hypertension, or high blood pressure, is when the force of your blood against your artery walls is too high. Your blood pressure is currently 159/93 mmHg, which is above the target range. We will increase your Entresto  dose to 97/103 mg twice daily. Continue following a low-salt diet and focus on weight management.  -HYPERLIPIDEMIA: Hyperlipidemia is a condition where you have high levels of cholesterol in your blood. You are currently taking atorvastatin  40 mg daily. We will order a cholesterol panel at the lab to check your levels. Continue taking atorvastatin  as prescribed.  -OBSTRUCTIVE SLEEP APNEA: Obstructive sleep apnea is a condition where your breathing stops and starts during sleep. Continue using your CPAP machine as prescribed.  -VITAMIN D  DEFICIENCY:  Vitamin D  deficiency means you have lower than normal levels of vitamin D , which is important for bone health. You are taking a high-dose vitamin D  supplement weekly. We will order a vitamin D  level check at the lab.  -INSOMNIA: Insomnia is a condition where you have trouble falling and staying asleep. We discussed sleep hygiene practices. You will start taking trazodone  25 mg at bedtime as needed to help with sleep.  INSTRUCTIONS:  We will submit a request for a continuous glucose monitor to your insurance. Please aim for 150 minutes of exercise per week. Continue following a low-salt diet and focus on weight management. We will order a urine microalbumin test, a cholesterol panel, and a vitamin D  level check at the lab. Start taking trazodone  25 mg at bedtime as needed for sleep. Follow up with us  as needed to monitor your conditions and adjust your treatment plan.

## 2024-05-23 NOTE — Progress Notes (Signed)
 Patient ID: Sabrina Ortiz, female    DOB: [birth year]  MRN: [id number]  CC: Diabetes (DM f/u - est care with Dr.Jet Traynham./Sleeping 3-4 hrs per night X1 yr - requesting trazodone  rx/No menstrual cycle since April / may/Requesting CGM/No to all vax. Yes to mammogram)   Subjective: Sabrina Ortiz is a 53 y.o. female who presents for chronic ds management. Her concerns today include:  Pt with hx of DM, HL, CHFrEF, GAD, GERD, vit D def, OSA on CPAP  Discussed the use of AI scribe software for clinical note transcription with the patient, who gave verbal consent to proceed.  History of Present Illness Sabrina Ortiz is a 53 year old female with diabetes, hypertension, and congestive heart failure who presents for a routine follow-up.  DM: Results for orders placed or performed in visit on 05/23/24  POCT glucose (manual entry)   Collection Time: 05/23/24  3:22 PM  Result Value Ref Range   POC Glucose 128 (A) 70 - 99 mg/dl  POCT glycosylated hemoglobin (Hb A1C)   Collection Time: 05/23/24  3:46 PM  Result Value Ref Range   Hemoglobin A1C     HbA1c POC (<> result, manual entry)     HbA1c, POC (prediabetic range)     HbA1c, POC (controlled diabetic range) 6.7 0.0 - 7.0 %  POCT urine pregnancy   Collection Time: 05/23/24  4:22 PM  Result Value Ref Range   Preg Test, Ur Negative Negative  She is on metformin  500 mg once daily but had a lapse in taking it due to illness and lack of refills, resuming after a two-week break. Blood sugar monitoring is infrequent, about once every two months, due to fear of needles and living alone. She is interested in a continuous glucose monitor to avoid finger pricks. Her diet excludes sweets and sugary drinks, opting for no-sugar water flavoring packets. Exercise is inconsistent, with occasional 15-minute walks along her sidewalk. Plans to get eye exam at [facility]  HTN/CHF: She is on Entresto  49/51 mg twice daily, carvedilol  6.25 mg daily, and  lasix  20 mg daily. She checks her blood pressure two to three times a week, with readings ranging from 128/80 to 140/90. She avoids pork, salty, and spicy foods but has recently consumed pasta salad with ham chips and pepperoni. She denies shortness of breath, chest pain, or leg swelling.  HL: She is on atorvastatin  40 mg daily for cholesterol, which she sometimes forgets to take at night. Her last cholesterol check was in November 2023.  Vit D def:  She is also on a high-dose vitamin D  supplement once a week and takes it consistently.  She experiences sleep issues, getting only three to four hours of sleep during the night. She has a history of using trazodone  for sleep, which was effective in helping her to fall and stay asleep.  Request rxn for it. She currently struggles with falling and staying asleep, often going to bed at 8 PM but not falling asleep until 1 or 2 AM. She sleeps with TV on and uses her phone in bed.  OSA: using CPAP consistently  She has not had a menstrual cycle since April or May, previously experiencing irregular cycles with two periods a month. She is sexually active. Would like pregnancy test today.  HM: She declines flu vaccine, Prevnar 20, Shingrix. Reportly has has cologuard kit at home    Patient Active Problem List   Diagnosis Date Noted   Obstructive sleep apnea 01/25/2023  Mixed hyperlipidemia 07/28/2022   BMI 28.0-28.9,adult 07/16/2021   Gastroesophageal reflux disease without esophagitis 05/02/2020   Elevation of levels of liver transaminase levels  05/02/2020   Type 2 diabetes mellitus with hyperglycemia, without long-term current use of insulin (HCC) 05/02/2020   Chronic right shoulder pain 11/23/2018   Prediabetes 04/27/2018   Systolic heart failure (HCC) 04/09/2018   Hypertensive emergency    Allergic reaction 03/29/2018   Angioedema 03/29/2018   Recurrent urticaria 03/29/2018   Rhinitis 03/29/2018   Vitamin D  deficiency 02/22/2018   Anxiety  03/31/2017   Trichomonas infection 07/17/2016   Herpes genitalis in women 07/17/2016   Essential hypertension 06/15/2013   Dyslipidemia 06/15/2013   Neuropathy 06/15/2013   GAD (generalized anxiety disorder) 03/10/2012     Current Outpatient Medications on File Prior to Visit  Medication Sig Dispense Refill   glucose blood (ONETOUCH VERIO) test strip Use to check blood sugar twice daily. 100 each 0   Lancets (ONETOUCH DELICA PLUS LANCET33G) MISC Use to check blood sugar twice daily. 100 each 0   metFORMIN  (GLUCOPHAGE ) 500 MG tablet Take 2 tablets (1,000 mg total) by mouth 2 (two) times daily with a meal. 360 tablet 0   Misc. Devices MISC CPAP, 11 cm of water.  Diagnosis obstructive sleep apnea. 1 each 0   aspirin  EC 81 MG EC tablet Take 1 tablet (81 mg total) by mouth daily.     Blood Glucose Monitoring Suppl (ONETOUCH VERIO) w/Device KIT 1 Units by Does not apply route in the morning and at bedtime. (Patient not taking: Reported on 01/25/2024) 1 kit 0   busPIRone  (BUSPAR ) 10 MG tablet Take 1 tablet (10 mg total) by mouth 3 (three) times daily. (To treat anxiety). 90 tablet 1   potassium chloride  (KLOR-CON ) 10 MEQ tablet Take 1 tablet (10 mEq) by mouth daily. 90 tablet 1   [DISCONTINUED] potassium chloride  (K-DUR,KLOR-CON ) 10 MEQ tablet Take 1 tablet (10 mEq total) by mouth daily. 30 tablet 3   No current facility-administered medications on file prior to visit.    Allergies  Allergen Reactions   Shellfish Allergy  Anaphylaxis    Social History   Socioeconomic History   Marital status: Single    Spouse name: Not on file   Number of children: Not on file   Years of education: Not on file   Highest education level: Not on file  Occupational History   Not on file  Tobacco Use   Smoking status: Never   Smokeless tobacco: Never  Vaping Use   Vaping status: Never Used  Substance and Sexual Activity   Alcohol use: No   Drug use: No   Sexual activity: Not Currently  Other Topics  Concern   Not on file  Social History Narrative   Not on file   Social Drivers of Health   Financial Resource Strain: Low Risk  (01/25/2024)   Overall Financial Resource Strain (CARDIA)    Difficulty of Paying Living Expenses: Not hard at all  Food Insecurity: Food Insecurity Present (01/25/2024)   Hunger Vital Sign    Worried About Running Out of Food in the Last Year: Sometimes true    Ran Out of Food in the Last Year: Sometimes true  Transportation Needs: No Transportation Needs (01/25/2024)   PRAPARE - Administrator, Civil Service (Medical): No    Lack of Transportation (Non-Medical): No  Physical Activity: Inactive (11/03/2023)   Exercise Vital Sign    Days of Exercise per Week: 0 days  Minutes of Exercise per Session: 0 min  Stress: No Stress Concern Present (01/25/2024)   [employer] of Occupational Health - Occupational Stress Questionnaire    Feeling of Stress : Only a little  Recent Concern: Stress - Stress Concern Present (11/03/2023)   [employer] of Occupational Health - Occupational Stress Questionnaire    Feeling of Stress : Rather much  Social Connections: Moderately Isolated (01/25/2024)   Social Connection and Isolation Panel    Frequency of Communication with Friends and Family: Twice a week    Frequency of Social Gatherings with Friends and Family: Once a week    Attends Religious Services: Never    Database administrator or Organizations: Yes    Attends Banker Meetings: Never    Marital Status: Never married  Intimate Partner Violence: Not At Risk (01/25/2024)   Humiliation, Afraid, Rape, and Kick questionnaire    Fear of Current or Ex-Partner: No    Emotionally Abused: No    Physically Abused: No    Sexually Abused: No    Family History  Problem Relation Age of Onset   Breast cancer Mother    Breast cancer Maternal Aunt    Breast cancer Maternal Grandmother     Past Surgical History:  Procedure Laterality Date    CYST REMOVAL HAND     RIGHT/LEFT HEART CATH AND CORONARY ANGIOGRAPHY N/A 04/12/2018   Procedure: RIGHT/LEFT HEART CATH AND CORONARY ANGIOGRAPHY;  Surgeon: Claudene Pacific, MD;  Location: [facility];  Service: Cardiovascular;  Laterality: N/A;    ROS: Review of Systems Negative except as stated above  PHYSICAL EXAM: BP (!) 142/94 (BP Location: Left Arm, Patient Position: Sitting, Cuff Size: Large)   Pulse 83   Temp 98.3 F (36.8 C) (Oral)   Ht 5' 6 (1.676 m)   Wt 175 lb (79.4 kg)   SpO2 99%   BMI 28.25 kg/m   Wt Readings from Last 3 Encounters:  05/23/24 175 lb (79.4 kg)  01/25/24 176 lb (79.8 kg)  11/03/23 176 lb (79.8 kg)    Physical Exam  General appearance - alert, well appearing, middle-age African-American female and in no distress Mental status - normal mood, behavior, speech, dress, motor activity, and thought processes Neck - supple, no significant adenopathy Chest - clear to auscultation, no wheezes, rales or rhonchi, symmetric air entry Heart - normal rate, regular rhythm, normal S1, S2, no murmurs, rubs, clicks or gallops Extremities - peripheral pulses normal, no pedal edema, no clubbing or cyanosis  Results for orders placed or performed in visit on 05/23/24  POCT glucose (manual entry)   Collection Time: 05/23/24  3:22 PM  Result Value Ref Range   POC Glucose 128 (A) 70 - 99 mg/dl  POCT glycosylated hemoglobin (Hb A1C)   Collection Time: 05/23/24  3:46 PM  Result Value Ref Range   Hemoglobin A1C     HbA1c POC (<> result, manual entry)     HbA1c, POC (prediabetic range)     HbA1c, POC (controlled diabetic range) 6.7 0.0 - 7.0 %  POCT urine pregnancy   Collection Time: 05/23/24  4:22 PM  Result Value Ref Range   Preg Test, Ur Negative Negative       05/23/2024    3:23 PM 01/25/2024    1:44 PM 11/03/2023    3:02 PM  Depression screen PHQ 2/9  Decreased Interest 1 0 1  Down, Depressed, Hopeless 1 1 1   PHQ - 2 Score 2 1 2  Altered sleeping 1  3 3   Tired, decreased energy 1 1 1   Change in appetite 1 0 1  Feeling bad or failure about yourself  0 0 0  Trouble concentrating 1 0 1  Moving slowly or fidgety/restless 1 0 0  Suicidal thoughts 0 0 0  PHQ-9 Score 7 5 8   Difficult doing work/chores Somewhat difficult Not difficult at all Somewhat difficult       Latest Ref Rng & Units 11/03/2023    3:49 PM 08/06/2022    4:12 PM 05/01/2020    4:52 PM  CMP  Glucose 70 - 99 mg/dL 874  884  769   BUN 6 - 24 mg/dL 13  13  8    Creatinine 0.57 - 1.00 mg/dL 9.09  8.93  8.88   Sodium 134 - 144 mmol/L 139  140  140   Potassium 3.5 - 5.2 mmol/L 4.2  3.9  4.0   Chloride 96 - 106 mmol/L 100  102  103   CO2 20 - 29 mmol/L 20     Calcium  8.7 - 10.2 mg/dL 9.3  9.4  9.6   Total Protein 6.0 - 8.5 g/dL 7.4  7.3  7.6   Total Bilirubin 0.0 - 1.2 mg/dL 0.4  0.3  0.6   Alkaline Phos 44 - 121 IU/L 71  61  68   AST 0 - 40 IU/L 41  73  64   ALT 0 - 32 IU/L 39      Lipid Panel     Component Value Date/Time   CHOL 294 (H) 08/06/2022 1612   TRIG 168 (H) 08/06/2022 1612   HDL 81 08/06/2022 1612   CHOLHDL 3.6 08/06/2022 1612   CHOLHDL 4.0 09/17/2018 0415   VLDL 25 09/17/2018 0415   LDLCALC 183 (H) 08/06/2022 1612    CBC    Component Value Date/Time   WBC 6.9 08/06/2022 1612   WBC 8.2 09/14/2018 2028   RBC 5.07 08/06/2022 1612   RBC 5.13 (H) 09/14/2018 2028   HGB 14.2 08/06/2022 1612   HCT 42.8 08/06/2022 1612   PLT 188 08/06/2022 1612   MCV 84 08/06/2022 1612   MCH 28.0 08/06/2022 1612   MCH 26.1 09/14/2018 2028   MCHC 33.2 08/06/2022 1612   MCHC 30.9 09/14/2018 2028   RDW 14.3 08/06/2022 1612   LYMPHSABS 1.6 08/06/2022 1612   MONOABS 0.5 09/14/2018 2028   EOSABS 0.1 08/06/2022 1612   BASOSABS 0.0 08/06/2022 1612    ASSESSMENT AND PLAN: 1. Type 2 diabetes mellitus with other specified complication, without long-term current use of insulin (HCC) (Primary) At goal.  Continue metformin  500 mg daily.  Continue healthy eating habits.   Encouraged her to try getting some form of exercise at least several days a week for 20 minutes. Advise to have optometrist at [facility] send me a short note after she has her DM eye exam done - POCT glycosylated hemoglobin (Hb A1C) - POCT glucose (manual entry) - CBC - Microalbumin / creatinine urine ratio  2. Diabetes mellitus treated with oral medication (HCC) See #1 above  3. Hypertension associated with diabetes (HCC) Not at goal.  Continue carvedilol  6.25 mg daily.  We have increased the Entresto  to 97/103 mg twice a day. - Basic Metabolic Panel  4. Hyperlipidemia due to type 2 diabetes mellitus (HCC) Continue atorvastatin  40 mg daily - Lipid panel  5. Chronic systolic heart failure (HCC) Stable and compensated.  Continue furosemide , carvedilol  and Entresto .  Dose of  Entresto  increased as stated above - carvedilol  (COREG ) 6.25 MG tablet; Take 1 tablet (6.25 mg) by mouth 2 times daily with a meal.  Dispense: 180 tablet; Refill: 1 - furosemide  (LASIX ) 20 MG tablet; Take 1 tablet (20 mg total) by mouth daily.  Dispense: 90 tablet; Refill: 1  6. Gastroesophageal reflux disease without esophagitis Patient needing refill on omeprazole  - omeprazole  (PRILOSEC) 20 MG capsule; Take 1 capsule (20 mg) by mouth daily.  Dispense: 90 capsule; Refill: 1  7. Vitamin D  deficiency - Vitamin D , Ergocalciferol , (DRISDOL ) 1.25 MG (50000 UNIT) CAPS capsule; Take 1 capsule (50,000 Units total) by mouth every 7 (seven) days.  Dispense: 4 capsule; Refill: 4 - VITAMIN D  25 Hydroxy (Vit-D Deficiency, Fractures)  8. Other insomnia Good sleep hygiene discussed and encouraged. Patient advised not to drink any caffeinated beverages or excessive alcohol use within several hours of bedtime.  Advised to get in bed around about the same time every night.  Once in bed, turn off all lights and sounds.  If unable to fall asleep within 30 to 45 minutes of getting in bed, patient should get up and try to do  something until she feels sleepy again.  At that time try getting back in bed. - traZODone  (DESYREL ) 50 MG tablet; Take 0.5 tablets (25 mg total) by mouth at bedtime as needed for sleep.  Dispense: 30 tablet; Refill: 3  9. Amenorrhea Negative pregnancy test - POCT urine pregnancy  10. Encounter for screening mammogram for malignant neoplasm of breast - MM 3D SCREENING MAMMOGRAM BILATERAL BREAST; Future  11. Decline flu shot 12.  Decline PCV 20 Encouraged to use the Cologuard kit and send it in. Patient was given the opportunity to ask questions.  Patient verbalized understanding of the plan and was able to repeat key elements of the plan.   This documentation was completed using Paediatric nurse.  Any transcriptional errors are unintentional.  Orders Placed This Encounter  Procedures   MM 3D SCREENING MAMMOGRAM BILATERAL BREAST   Lipid panel   CBC   VITAMIN D  25 Hydroxy (Vit-D Deficiency, Fractures)   Microalbumin / creatinine urine ratio   Basic Metabolic Panel   POCT glycosylated hemoglobin (Hb A1C)   POCT glucose (manual entry)   POCT urine pregnancy     Requested Prescriptions   Signed Prescriptions Disp Refills   atorvastatin  (LIPITOR) 40 MG tablet 90 tablet 1    Sig: Take 1 tablet (40 mg total) by mouth daily at 6pm or later after evening meal   carvedilol  (COREG ) 6.25 MG tablet 180 tablet 1    Sig: Take 1 tablet (6.25 mg) by mouth 2 times daily with a meal.   furosemide  (LASIX ) 20 MG tablet 90 tablet 1    Sig: Take 1 tablet (20 mg total) by mouth daily.   omeprazole  (PRILOSEC) 20 MG capsule 90 capsule 1    Sig: Take 1 capsule (20 mg) by mouth daily.   Vitamin D , Ergocalciferol , (DRISDOL ) 1.25 MG (50000 UNIT) CAPS capsule 4 capsule 4    Sig: Take 1 capsule (50,000 Units total) by mouth every 7 (seven) days.   Continuous Glucose Sensor (FREESTYLE LIBRE 3 PLUS SENSOR) MISC 2 each 11    Sig: Change sensor every 15 days.   sacubitril -valsartan   (ENTRESTO ) 97-103 MG 60 tablet 6    Sig: Take 1 tablet by mouth 2 (two) times daily.   traZODone  (DESYREL ) 50 MG tablet 30 tablet 3    Sig: Take 0.5 tablets (  25 mg total) by mouth at bedtime as needed for sleep.    Return in about 4 months (around 09/22/2024).  Barnie Louder, MD, FACP

## 2024-05-24 ENCOUNTER — Ambulatory Visit: Payer: Self-pay | Admitting: Internal Medicine

## 2024-05-24 ENCOUNTER — Other Ambulatory Visit (HOSPITAL_COMMUNITY): Payer: Self-pay

## 2024-05-25 ENCOUNTER — Telehealth: Payer: Self-pay

## 2024-05-25 LAB — CBC
Hematocrit: 44.4 % (ref 34.0–46.6)
Hemoglobin: 13.8 g/dL (ref 11.1–15.9)
MCH: 26.6 pg (ref 26.6–33.0)
MCHC: 31.1 g/dL — ABNORMAL LOW (ref 31.5–35.7)
MCV: 86 fL (ref 79–97)
Platelets: 154 x10E3/uL (ref 150–450)
RBC: 5.18 x10E6/uL (ref 3.77–5.28)
RDW: 15.3 % (ref 11.7–15.4)
WBC: 7.5 x10E3/uL (ref 3.4–10.8)

## 2024-05-25 LAB — BASIC METABOLIC PANEL WITH GFR
BUN/Creatinine Ratio: 11 (ref 9–23)
BUN: 11 mg/dL (ref 6–24)
CO2: 22 mmol/L (ref 20–29)
Calcium: 9.6 mg/dL (ref 8.7–10.2)
Chloride: 98 mmol/L (ref 96–106)
Creatinine, Ser: 1.02 mg/dL — ABNORMAL HIGH (ref 0.57–1.00)
Glucose: 128 mg/dL — ABNORMAL HIGH (ref 70–99)
Potassium: 4.1 mmol/L (ref 3.5–5.2)
Sodium: 136 mmol/L (ref 134–144)
eGFR: 66 mL/min/1.73 (ref >59)

## 2024-05-25 LAB — LIPID PANEL
Chol/HDL Ratio: 2.8 ratio (ref 0.0–4.4)
Cholesterol, Total: 231 mg/dL — ABNORMAL HIGH (ref 100–199)
HDL: 83 mg/dL (ref >39)
LDL Chol Calc (NIH): 119 mg/dL — ABNORMAL HIGH (ref 0–99)
Triglycerides: 171 mg/dL — ABNORMAL HIGH (ref 0–149)
VLDL Cholesterol Cal: 29 mg/dL (ref 5–40)

## 2024-05-25 LAB — VITAMIN D 25 HYDROXY (VIT D DEFICIENCY, FRACTURES): Vit D, 25-Hydroxy: 46.1 ng/mL (ref 30.0–100.0)

## 2024-05-25 LAB — MICROALBUMIN / CREATININE URINE RATIO
Creatinine, Urine: 162.1 mg/dL
Microalb/Creat Ratio: 11 mg/g{creat} (ref 0–29)
Microalbumin, Urine: 17.6 ug/mL

## 2024-05-25 NOTE — Telephone Encounter (Signed)
 Copied from CRM #[id number]. Topic: Clinical - Prescription Issue >> May 24, 2024  3:08 PM Wess RAMAN wrote: Reason for CRM: Patient states her insurance, [facility], will not cover the generic version of Entresto  and she needs the brand name called in instead  Callback #: [id number]  Pharmacy: [facility] [address] [place] [address] Phone: [phone] Fax: [phone] Hours: Mon-Fri 7:30a-7p; Sat 8a-4:30p; [place] 10a-2p

## 2024-05-26 ENCOUNTER — Other Ambulatory Visit (HOSPITAL_COMMUNITY): Payer: Self-pay

## 2024-05-26 ENCOUNTER — Telehealth: Payer: Self-pay

## 2024-05-26 ENCOUNTER — Other Ambulatory Visit: Payer: Self-pay

## 2024-05-26 ENCOUNTER — Other Ambulatory Visit: Payer: Self-pay | Admitting: Physician Assistant

## 2024-05-26 DIAGNOSIS — I5022 Chronic systolic (congestive) heart failure: Secondary | ICD-10-CM

## 2024-05-26 MED ORDER — POTASSIUM CHLORIDE ER 10 MEQ PO TBCR
10.0000 meq | EXTENDED_RELEASE_TABLET | Freq: Every day | ORAL | 1 refills | Status: AC
  Filled 2024-05-26: qty 90, 90d supply, fill #0
  Filled 2024-08-27: qty 90, 90d supply, fill #1

## 2024-05-26 NOTE — Telephone Encounter (Signed)
 Copied from CRM [id number]. Topic: Clinical - Prescription Issue >> May 26, 2024 11:22 AM Dedra B wrote: Reason for CRM: Pt called to follow up on request to have Entresto  sent to pharmacy. Insurance will not cover the generic.

## 2024-05-27 ENCOUNTER — Other Ambulatory Visit: Payer: Self-pay | Admitting: Internal Medicine

## 2024-05-27 MED ORDER — ENTRESTO 97-103 MG PO TABS
1.0000 | ORAL_TABLET | Freq: Two times a day (BID) | ORAL | 6 refills | Status: AC
  Filled 2024-05-27: qty 60, 30d supply, fill #0
  Filled 2024-06-24: qty 60, 30d supply, fill #1
  Filled 2024-08-27: qty 60, 30d supply, fill #2
  Filled 2024-09-22: qty 60, 30d supply, fill #3

## 2024-05-27 MED ORDER — ATORVASTATIN CALCIUM 40 MG PO TABS
60.0000 mg | ORAL_TABLET | Freq: Every day | ORAL | 1 refills | Status: AC
  Filled 2024-05-27 – 2024-08-27 (×5): qty 135, 90d supply, fill #0
  Filled 2024-08-30: qty 45, 30d supply, fill #1

## 2024-05-27 NOTE — Addendum Note (Signed)
 Addended by: VICCI SOBER B on: 05/27/2024 09:18 AM   Modules accepted: Orders

## 2024-05-28 ENCOUNTER — Other Ambulatory Visit (HOSPITAL_COMMUNITY): Payer: Self-pay

## 2024-05-29 ENCOUNTER — Other Ambulatory Visit (HOSPITAL_COMMUNITY): Payer: Self-pay

## 2024-05-29 ENCOUNTER — Other Ambulatory Visit: Payer: Self-pay

## 2024-06-05 ENCOUNTER — Other Ambulatory Visit: Payer: Self-pay

## 2024-06-05 ENCOUNTER — Other Ambulatory Visit (HOSPITAL_COMMUNITY): Payer: Self-pay

## 2024-06-21 ENCOUNTER — Ambulatory Visit
Admission: RE | Admit: 2024-06-21 | Discharge: 2024-06-21 | Disposition: A | Source: Ambulatory Visit | Attending: Internal Medicine | Admitting: Internal Medicine

## 2024-06-21 DIAGNOSIS — Z1231 Encounter for screening mammogram for malignant neoplasm of breast: Secondary | ICD-10-CM

## 2024-06-24 ENCOUNTER — Other Ambulatory Visit (HOSPITAL_COMMUNITY): Payer: Self-pay

## 2024-06-26 ENCOUNTER — Other Ambulatory Visit: Payer: Self-pay

## 2024-06-27 ENCOUNTER — Other Ambulatory Visit: Payer: Self-pay | Admitting: Internal Medicine

## 2024-06-27 DIAGNOSIS — Z1212 Encounter for screening for malignant neoplasm of rectum: Secondary | ICD-10-CM | POA: Diagnosis not present

## 2024-06-27 DIAGNOSIS — R2232 Localized swelling, mass and lump, left upper limb: Secondary | ICD-10-CM

## 2024-06-27 DIAGNOSIS — Z1211 Encounter for screening for malignant neoplasm of colon: Secondary | ICD-10-CM | POA: Diagnosis not present

## 2024-07-07 LAB — COLOGUARD: COLOGUARD: NEGATIVE

## 2024-07-10 DIAGNOSIS — G4733 Obstructive sleep apnea (adult) (pediatric): Secondary | ICD-10-CM | POA: Diagnosis not present

## 2024-07-21 ENCOUNTER — Other Ambulatory Visit: Payer: Self-pay

## 2024-07-21 ENCOUNTER — Other Ambulatory Visit (HOSPITAL_COMMUNITY): Payer: Self-pay

## 2024-07-21 ENCOUNTER — Other Ambulatory Visit: Payer: Self-pay | Admitting: Internal Medicine

## 2024-07-21 ENCOUNTER — Other Ambulatory Visit: Payer: Self-pay | Admitting: Family Medicine

## 2024-07-21 DIAGNOSIS — E119 Type 2 diabetes mellitus without complications: Secondary | ICD-10-CM

## 2024-07-21 DIAGNOSIS — E1165 Type 2 diabetes mellitus with hyperglycemia: Secondary | ICD-10-CM

## 2024-07-21 DIAGNOSIS — Z7984 Long term (current) use of oral hypoglycemic drugs: Secondary | ICD-10-CM

## 2024-07-23 ENCOUNTER — Other Ambulatory Visit (HOSPITAL_COMMUNITY): Payer: Self-pay

## 2024-07-23 MED ORDER — METFORMIN HCL 500 MG PO TABS
1000.0000 mg | ORAL_TABLET | Freq: Two times a day (BID) | ORAL | 0 refills | Status: AC
  Filled 2024-07-23 – 2024-07-25 (×2): qty 360, 90d supply, fill #0

## 2024-07-23 MED ORDER — ONETOUCH VERIO VI STRP
ORAL_STRIP | 0 refills | Status: AC
  Filled 2024-07-23 – 2024-07-25 (×2): qty 100, 50d supply, fill #0

## 2024-07-23 MED ORDER — ONETOUCH DELICA PLUS LANCET33G MISC
0 refills | Status: AC
  Filled 2024-07-23 – 2024-07-25 (×2): qty 100, 50d supply, fill #0

## 2024-07-24 ENCOUNTER — Other Ambulatory Visit (HOSPITAL_COMMUNITY): Payer: Self-pay

## 2024-07-25 ENCOUNTER — Other Ambulatory Visit: Payer: Self-pay

## 2024-07-25 ENCOUNTER — Other Ambulatory Visit (HOSPITAL_COMMUNITY): Payer: Self-pay

## 2024-07-26 ENCOUNTER — Other Ambulatory Visit (HOSPITAL_COMMUNITY): Payer: Self-pay

## 2024-07-28 ENCOUNTER — Ambulatory Visit
Admission: RE | Admit: 2024-07-28 | Discharge: 2024-07-28 | Disposition: A | Discharge status: Home / Self Care | Source: Ambulatory Visit | Attending: Internal Medicine | Admitting: Internal Medicine

## 2024-07-28 DIAGNOSIS — R2232 Localized swelling, mass and lump, left upper limb: Secondary | ICD-10-CM

## 2024-07-28 DIAGNOSIS — R928 Other abnormal and inconclusive findings on diagnostic imaging of breast: Secondary | ICD-10-CM | POA: Diagnosis not present

## 2024-07-28 DIAGNOSIS — N6332 Unspecified lump in axillary tail of the left breast: Secondary | ICD-10-CM | POA: Diagnosis not present

## 2024-07-30 ENCOUNTER — Ambulatory Visit: Payer: Self-pay | Admitting: Internal Medicine

## 2024-08-16 ENCOUNTER — Other Ambulatory Visit: Payer: Self-pay | Admitting: Pharmacist

## 2024-08-16 NOTE — Progress Notes (Signed)
 Pharmacy Quality Measure Review  This patient is appearing on a report for the adherence measure for diabetes medications this calendar year.   Medication: metformin  Last fill date: 07/25/24 for 90 day supply  Insurance report was not up to date. No action needed at this time.   Sabrina Ortiz, PharmD, JAQUELINE, CPP Clinical Pharmacist [facility] [phone]

## 2024-08-28 ENCOUNTER — Other Ambulatory Visit (HOSPITAL_COMMUNITY): Payer: Self-pay

## 2024-08-28 ENCOUNTER — Other Ambulatory Visit: Payer: Self-pay

## 2024-08-30 ENCOUNTER — Other Ambulatory Visit: Payer: Self-pay

## 2024-08-30 ENCOUNTER — Other Ambulatory Visit (HOSPITAL_COMMUNITY): Payer: Self-pay

## 2024-08-30 ENCOUNTER — Telehealth: Payer: Self-pay | Admitting: Internal Medicine

## 2024-08-30 NOTE — Telephone Encounter (Signed)
 Copied from CRM [id number]. Topic: Clinical - Prescription Issue >> Aug 30, 2024  9:38 AM Joesph NOVAK wrote:  Reason for CRM: Pharmacy is charging patient for her medication because of how pcp is writing her prescriptions. She would like for her pcp to re write the prescriptions. Please fu and advise.    Vitamin D , Ergocalciferol , (DRISDOL ) 1.25 MG (50000 UNIT) CAPS capsule [[id number]] > has not picked this up yet, patient states pharmacy needs a PA.    atorvastatin  (LIPITOR) 40 MG tablet [[id number]] >>>>  insurance will not cover 1.5 tabs/day.     [place] - [facility] [address] [place] [address] Phone: [phone] Fax: [phone] Hours: Mon-Fri 7:30a-7p; Sat 8a-4:30p; [place] 10a-2p

## 2024-09-01 ENCOUNTER — Telehealth: Payer: Self-pay

## 2024-09-01 ENCOUNTER — Other Ambulatory Visit (HOSPITAL_COMMUNITY): Payer: Self-pay

## 2024-09-01 ENCOUNTER — Other Ambulatory Visit: Payer: Self-pay

## 2024-09-01 NOTE — Telephone Encounter (Signed)
 Pharmacy Patient Advocate Encounter  Received notification from [employer] that Prior Authorization for LIPITOR 1 & 1/2 TAB DAILY has been APPROVED from 09/08/2023 to 09/06/2024   PA #/Case ID/Reference #: [id number]

## 2024-09-01 NOTE — Telephone Encounter (Signed)
 Called but no answer. LVM informing that Vitamin D  is $5 and the other medication has a prior authorization that is being worked on. Please see below for more information.

## 2024-09-22 ENCOUNTER — Other Ambulatory Visit: Payer: Self-pay

## 2024-09-22 ENCOUNTER — Ambulatory Visit: Attending: Internal Medicine | Admitting: Internal Medicine

## 2024-09-22 VITALS — BP 108/68 | HR 88 | Temp 98.4°F | Ht 66.0 in | Wt 169.8 lb

## 2024-09-22 DIAGNOSIS — E1159 Type 2 diabetes mellitus with other circulatory complications: Secondary | ICD-10-CM | POA: Diagnosis not present

## 2024-09-22 DIAGNOSIS — L729 Follicular cyst of the skin and subcutaneous tissue, unspecified: Secondary | ICD-10-CM

## 2024-09-22 DIAGNOSIS — I10 Essential (primary) hypertension: Secondary | ICD-10-CM

## 2024-09-22 DIAGNOSIS — E1169 Type 2 diabetes mellitus with other specified complication: Secondary | ICD-10-CM

## 2024-09-22 DIAGNOSIS — E785 Hyperlipidemia, unspecified: Secondary | ICD-10-CM | POA: Diagnosis not present

## 2024-09-22 DIAGNOSIS — Z7984 Long term (current) use of oral hypoglycemic drugs: Secondary | ICD-10-CM | POA: Diagnosis not present

## 2024-09-22 DIAGNOSIS — I5022 Chronic systolic (congestive) heart failure: Secondary | ICD-10-CM

## 2024-09-22 LAB — POCT GLYCOSYLATED HEMOGLOBIN (HGB A1C): Hemoglobin A1C: 7 % — AB (ref 4.0–5.6)

## 2024-09-22 NOTE — Patient Instructions (Signed)
" °  VISIT SUMMARY: During your routine follow-up visit, we discussed your diabetes, heart failure, hyperlipidemia, and a benign cyst. We reviewed your recent health changes and adjusted your treatment plan accordingly.  YOUR PLAN: -TYPE 2 DIABETES MELLITUS WITH CIRCULATORY AND OTHER SPECIFIED COMPLICATIONS: Your hemoglobin A1c has increased to 7.0, likely due to inconsistent medication adherence and dietary choices. Type 2 diabetes is a condition where your body does not use insulin properly, leading to high blood sugar levels. Continue taking metformin  1000 mg twice daily, use your continuous glucose monitor as instructed, reduce orange juice intake, and resume regular exercise 3-4 days a week for 30 minutes. We will recheck your A1c at the next visit. Contact us  if your blood sugars remain high for potential medication adjustment.  -CHRONIC SYSTOLIC HEART FAILURE: Your blood pressure is currently well-controlled, and you have not experienced recent episodes of chest pain, shortness of breath, or leg swelling. Chronic systolic heart failure is a condition where the heart does not pump blood effectively. Continue taking Entresto  49/51 mg twice daily and carvedilol  6.25 mg twice daily. Use furosemide  as needed for symptoms of fluid overload and take potassium only on days when you take furosemide . Monitor for symptoms of fluid overload, such as shortness of breath or leg swelling.  -HYPERLIPIDEMIA: Your LDL cholesterol is 119 mg/dL, which is above the goal. Hyperlipidemia is a condition where there are high levels of fats in the blood. We have increased your atorvastatin  dosage to 60 mg daily. Ensure your prescription reflects this updated dosage.  -BENIGN CYST OF LEFT AXILLA: A small cyst in your left armpit has decreased in size. A benign cyst is a non-cancerous lump. No immediate intervention is required, but monitor the cyst size. We will consider a dermatology referral if the cyst increases in  size.  INSTRUCTIONS: We will recheck your A1c at the next visit. Contact us  if your blood sugars remain high for potential medication adjustment. Monitor the cyst size and let us  know if it increases.    Contains text generated by Abridge.   "

## 2024-09-22 NOTE — Progress Notes (Signed)
 "   Patient ID: Sabrina Ortiz, female    DOB: Apr 16, 1971  MRN: [id number]  CC: Diabetes   Subjective: Sabrina Ortiz is a 54 y.o. female who presents for chronic ds management. Her chronic medical issues include:  Pt with hx of DM, HL, CHFrEF, GAD, GERD, vit D def, OSA on CPAP   Discussed the use of AI scribe software for clinical note transcription with the patient, who gave verbal consent to proceed.  History of Present Illness Sabrina Ortiz is a 54 year old female with diabetes, hyperlipidemia, and heart failure who presents for a routine four-month follow-up visit.  DM:  Results for orders placed or performed in visit on 09/22/24  POCT glycosylated hemoglobin (Hb A1C)   Collection Time: 09/22/24  2:58 PM  Result Value Ref Range   Hemoglobin A1C 7.0 (A) 4.0 - 5.6 %   HbA1c POC (<> result, manual entry)     HbA1c, POC (prediabetic range)     HbA1c, POC (controlled diabetic range)    Her hemoglobin A1c has increased to 7.0 from 6.7 four months ago, which she attributes to recent stress and dietary changes following her aunt's passing. She has been consuming orange juice and not eating or sleeping well. She had helped in caring for her aunt for a while prior to her death.  She has been inconsistent with her medication adherence during this period but has resumed taking metformin  1000 mg twice daily consistently. She was prescribed CGM on last visit and has it but initially misunderstood its application, leading to improper use. She has been checking her blood sugar manually two to three times daily since Monday, noting a decrease in levels indicated by her monitor's color-coded system; the monitor does not give an actual BS number. Wgh down 6 lbs since last visit which she attributes to stress from last few mths in helping to care for her aunt who recently died  CHF/HTN:  she is on Entresto  97/103 mg twice daily, carvedilol  6.25 mg twice daily, and furosemide  20 mg as needed. Just  started taking the Furosemide  PRN after RN from her insurance visited her earlier this wk and told her she may not have to take it every day. She does not report chest pain, shortness of breath, or leg swelling. Her blood pressure was elevated earlier in the week but has since improved. She is monitoring it regularly, with a recent reading of 133/88 at home.  HL: she was on atorvastatin  40 mg daily but I advised increase  to 60 mg after LDL on last visit was 119.  Pt does not recall the conversation with my CMA regarding this. She is still taking 40 mg.   She continues to take vitamin D  once a wk with last level of  46.   Insomnia: continues to take trazodone  as prescribed.   Recently had mammogram with follow-up ultrasound for lesion that was seen in the left axilla.  It was determined to be a cyst.    Patient Active Problem List   Diagnosis Date Noted   Obstructive sleep apnea 01/25/2023   Mixed hyperlipidemia 07/28/2022   BMI 28.0-28.9,adult 07/16/2021   Gastroesophageal reflux disease without esophagitis 05/02/2020   Elevation of levels of liver transaminase levels  05/02/2020   Type 2 diabetes mellitus with hyperglycemia, without long-term current use of insulin (HCC) 05/02/2020   Chronic right shoulder pain 11/23/2018   Prediabetes 04/27/2018   Systolic heart failure (HCC) 04/09/2018   Hypertensive emergency  Allergic reaction 03/29/2018   Angioedema 03/29/2018   Recurrent urticaria 03/29/2018   Rhinitis 03/29/2018   Vitamin D  deficiency 02/22/2018   Anxiety 03/31/2017   Trichomonas infection 07/17/2016   Herpes genitalis in women 07/17/2016   Essential hypertension 06/15/2013   Dyslipidemia 06/15/2013   Neuropathy 06/15/2013   GAD (generalized anxiety disorder) 03/10/2012     Medications Ordered Prior to Encounter[1]  Allergies[2]  Social History   Socioeconomic History   Marital status: Single    Spouse name: Not on file   Number of children: Not on file    Years of education: Not on file   Highest education level: Associate degree: occupational, scientist, product/process development, or vocational program  Occupational History   Not on file  Tobacco Use   Smoking status: Never   Smokeless tobacco: Never  Vaping Use   Vaping status: Never Used  Substance and Sexual Activity   Alcohol use: No   Drug use: No   Sexual activity: Not Currently  Other Topics Concern   Not on file  Social History Narrative   Not on file   Social Drivers of Health   Tobacco Use: Low Risk (05/23/2024)   Patient History    Smoking Tobacco Use: Never    Smokeless Tobacco Use: Never    Passive Exposure: Not on file  Financial Resource Strain: Low Risk (09/22/2024)   Overall Financial Resource Strain (CARDIA)    Difficulty of Paying Living Expenses: Not very hard  Food Insecurity: Food Insecurity Present (09/22/2024)   Epic    Worried About Programme Researcher, Broadcasting/film/video in the Last Year: Sometimes true    Ran Out of Food in the Last Year: Sometimes true  Transportation Needs: Unmet Transportation Needs (09/22/2024)   Epic    Lack of Transportation (Medical): Yes    Lack of Transportation (Non-Medical): Yes  Physical Activity: Inactive (09/22/2024)   Exercise Vital Sign    Days of Exercise per Week: 0 days    Minutes of Exercise per Session: Not on file  Stress: Stress Concern Present (09/22/2024)   [employer] of Occupational Health - Occupational Stress Questionnaire    Feeling of Stress: To some extent  Social Connections: Moderately Isolated (09/22/2024)   Social Connection and Isolation Panel    Frequency of Communication with Friends and Family: Twice a week    Frequency of Social Gatherings with Friends and Family: Once a week    Attends Religious Services: Never    Database Administrator or Organizations: Yes    Attends Banker Meetings: Never    Marital Status: Never married  Intimate Partner Violence: Not At Risk (01/25/2024)   Humiliation, Afraid, Rape, and Kick  questionnaire    Fear of Current or Ex-Partner: No    Emotionally Abused: No    Physically Abused: No    Sexually Abused: No  Depression (PHQ2-9): Medium Risk (05/23/2024)   Depression (PHQ2-9)    PHQ-2 Score: 7  Alcohol Screen: Low Risk (09/22/2024)   Alcohol Screen    Last Alcohol Screening Score (AUDIT): 1  Housing: High Risk (09/22/2024)   Epic    Unable to Pay for Housing in the Last Year: Yes    Number of Times Moved in the Last Year: 0    Homeless in the Last Year: No  Utilities: Not At Risk (01/25/2024)   [employer] Utilities    Threatened with loss of utilities: No  Health Literacy: Adequate Health Literacy (01/25/2024)   B1300 Health Literacy  Frequency of need for help with medical instructions: Never    Family History  Problem Relation Age of Onset   Breast cancer Mother    Breast cancer Maternal Aunt    Breast cancer Maternal Aunt    Breast cancer Maternal Aunt    Breast cancer Maternal Uncle    Breast cancer Maternal Grandmother     Past Surgical History:  Procedure Laterality Date   CYST REMOVAL HAND     RIGHT/LEFT HEART CATH AND CORONARY ANGIOGRAPHY N/A 04/12/2018   Procedure: RIGHT/LEFT HEART CATH AND CORONARY ANGIOGRAPHY;  Surgeon: Claudene Pacific, MD;  Location: [facility];  Service: Cardiovascular;  Laterality: N/A;    ROS: Review of Systems Negative except as stated above  PHYSICAL EXAM: BP 108/68 (BP Location: Left Arm, Patient Position: Sitting, Cuff Size: Normal)   Pulse 88   Temp 98.4 F (36.9 C) (Oral)   Ht 5' 6 (1.676 m)   Wt 169 lb 12.8 oz (77 kg)   SpO2 99%   BMI 27.41 kg/m   Wt Readings from Last 3 Encounters:  09/22/24 169 lb 12.8 oz (77 kg)  05/23/24 175 lb (79.4 kg)  01/25/24 176 lb (79.8 kg)    Physical Exam  General appearance - alert, well appearing, and in no distress Mental status - normal mood, behavior, speech, dress, motor activity, and thought processes Neck - supple, no significant adenopathy Chest - clear to  auscultation, no wheezes, rales or rhonchi, symmetric air entry Heart - normal rate, regular rhythm, normal S1, S2, no murmurs, rubs, clicks or gallops Extremities - peripheral pulses normal, no pedal edema, no clubbing or cyanosis Skin: Left axilla -tiny subcutaneous, firm movable mass felt in the left axilla.  Results for orders placed or performed in visit on 09/22/24  POCT glycosylated hemoglobin (Hb A1C)   Collection Time: 09/22/24  2:58 PM  Result Value Ref Range   Hemoglobin A1C 7.0 (A) 4.0 - 5.6 %   HbA1c POC (<> result, manual entry)     HbA1c, POC (prediabetic range)     HbA1c, POC (controlled diabetic range)         Latest Ref Rng & Units 05/23/2024    4:36 PM 11/03/2023    3:49 PM 08/06/2022    4:12 PM  CMP  Glucose 70 - 99 mg/dL 871  874  884   BUN 6 - 24 mg/dL 11  13  13    Creatinine 0.57 - 1.00 mg/dL 8.97  9.09  8.93   Sodium 134 - 144 mmol/L 136  139  140   Potassium 3.5 - 5.2 mmol/L 4.1  4.2  3.9   Chloride 96 - 106 mmol/L 98  100  102   CO2 20 - 29 mmol/L 22  20    Calcium  8.7 - 10.2 mg/dL 9.6  9.3  9.4   Total Protein 6.0 - 8.5 g/dL  7.4  7.3   Total Bilirubin 0.0 - 1.2 mg/dL  0.4  0.3   Alkaline Phos 44 - 121 IU/L  71  61   AST 0 - 40 IU/L  41  73   ALT 0 - 32 IU/L  39     Lipid Panel     Component Value Date/Time   CHOL 231 (H) 05/23/2024 1636   TRIG 171 (H) 05/23/2024 1636   HDL 83 05/23/2024 1636   CHOLHDL 2.8 05/23/2024 1636   CHOLHDL 4.0 09/17/2018 0415   VLDL 25 09/17/2018 0415   LDLCALC 119 (H) 05/23/2024  1636    CBC    Component Value Date/Time   WBC 7.5 05/23/2024 1636   WBC 8.2 09/14/2018 2028   RBC 5.18 05/23/2024 1636   RBC 5.13 (H) 09/14/2018 2028   HGB 13.8 05/23/2024 1636   HCT 44.4 05/23/2024 1636   PLT 154 05/23/2024 1636   MCV 86 05/23/2024 1636   MCH 26.6 05/23/2024 1636   MCH 26.1 09/14/2018 2028   MCHC 31.1 (L) 05/23/2024 1636   MCHC 30.9 09/14/2018 2028   RDW 15.3 05/23/2024 1636   LYMPHSABS 1.6 08/06/2022 1612    MONOABS 0.5 09/14/2018 2028   EOSABS 0.1 08/06/2022 1612   BASOSABS 0.0 08/06/2022 1612    ASSESSMENT AND PLAN: 1. Type 2 diabetes mellitus with other specified complication, without long-term current use of insulin (HCC) (Primary) A1c increased to 7.0, likely due to dietary choices. Weight decreased by 6 pounds. - Continue metformin  1000 mg twice daily. [place] Sous over with her how to apply the sensor for her CGM. She will start using it. - Encouraged dietary modifications, including reducing orange juice intake. - Encouraged resumption of regular exercise routine, 3-4 days a week for 30 minutes. - POCT glycosylated hemoglobin (Hb A1C)  2. Long term current use of oral hypoglycemic drug See #1 above  3. Chronic systolic heart failure (HCC) Stable and compensated.  Continue carvedilol  and Entresto .  Use Furosemide  PRN  4. Hypertension associated with diabetes (HCC) At goal. Continue carvedilol  6.25 mg and Entresto  to 97/103 mg twice a day   5. Hyperlipidemia due to type 2 diabetes mellitus (HCC) Pt will increase Lipitor to 60 mg daily  6. Subcutaneous cyst Observe.    Patient was given the opportunity to ask questions.  Patient verbalized understanding of the plan and was able to repeat key elements of the plan.   This documentation was completed using Paediatric nurse.  Any transcriptional errors are unintentional.  Orders Placed This Encounter  Procedures   POCT glycosylated hemoglobin (Hb A1C)     Requested Prescriptions    No prescriptions requested or ordered in this encounter    Return in about 4 months (around 01/20/2025).  Barnie Louder, MD, FACP     [1]  Current Outpatient Medications on File Prior to Visit  Medication Sig Dispense Refill   aspirin  EC 81 MG EC tablet Take 1 tablet (81 mg total) by mouth daily.     atorvastatin  (LIPITOR) 40 MG tablet Take 1.5 tablets (60 mg total) by mouth daily. 135 tablet 1   Blood Glucose Monitoring  Suppl (ONETOUCH VERIO) w/Device KIT 1 Units by Does not apply route in the morning and at bedtime. (Patient not taking: Reported on 01/25/2024) 1 kit 0   busPIRone  (BUSPAR ) 10 MG tablet Take 1 tablet (10 mg total) by mouth 3 (three) times daily. (To treat anxiety). 90 tablet 1   carvedilol  (COREG ) 6.25 MG tablet Take 1 tablet (6.25 mg) by mouth 2 times daily with a meal. 180 tablet 1   Continuous Glucose Sensor (FREESTYLE LIBRE 3 PLUS SENSOR) MISC Change sensor every 15 days. 2 each 11   ENTRESTO  97-103 MG Take 1 tablet by mouth 2 (two) times daily. 60 tablet 6   furosemide  (LASIX ) 20 MG tablet Take 1 tablet (20 mg total) by mouth daily. 90 tablet 1   glucose blood (ONETOUCH VERIO) test strip Use to check blood sugar twice daily. 100 each 0   Lancets (ONETOUCH DELICA PLUS LANCET33G) MISC Use to check blood sugar twice  daily. 100 each 0   metFORMIN  (GLUCOPHAGE ) 500 MG tablet Take 2 tablets (1,000 mg total) by mouth 2 (two) times daily with a meal. 360 tablet 0   Misc. Devices MISC CPAP, 11 cm of water.  Diagnosis obstructive sleep apnea. 1 each 0   omeprazole  (PRILOSEC) 20 MG capsule Take 1 capsule (20 mg) by mouth daily. 90 capsule 1   potassium chloride  (KLOR-CON ) 10 MEQ tablet Take 1 tablet (10 mEq) by mouth daily. 90 tablet 1   traZODone  (DESYREL ) 50 MG tablet Take 0.5 tablets (25 mg total) by mouth at bedtime as needed for sleep. 30 tablet 3   Vitamin D , Ergocalciferol , (DRISDOL ) 1.25 MG (50000 UNIT) CAPS capsule Take 1 capsule (50,000 Units total) by mouth every 7 (seven) days. 4 capsule 4   [DISCONTINUED] potassium chloride  (K-DUR,KLOR-CON ) 10 MEQ tablet Take 1 tablet (10 mEq total) by mouth daily. 30 tablet 3   No current facility-administered medications on file prior to visit.  [2]  Allergies Allergen Reactions   Shellfish Allergy  Anaphylaxis   "

## 2025-01-22 ENCOUNTER — Ambulatory Visit: Payer: Self-pay | Admitting: Internal Medicine

## 2025-01-30 ENCOUNTER — Ambulatory Visit
